# Patient Record
Sex: Female | Born: 1955
Health system: Southern US, Community
[De-identification: ages and names within clinical notes are randomized; demographics above are authoritative.]

## PROBLEM LIST (undated history)

## (undated) DIAGNOSIS — C50919 Malignant neoplasm of unspecified site of unspecified female breast: Secondary | ICD-10-CM

## (undated) DIAGNOSIS — I509 Heart failure, unspecified: Secondary | ICD-10-CM

## (undated) DIAGNOSIS — T827XXA Infection and inflammatory reaction due to other cardiac and vascular devices, implants and grafts, initial encounter: Secondary | ICD-10-CM

## (undated) DIAGNOSIS — Z95810 Presence of automatic (implantable) cardiac defibrillator: Secondary | ICD-10-CM

## (undated) HISTORY — PX: CHOLECYSTECTOMY: SHX55

## (undated) HISTORY — DX: Heart failure, unspecified: I50.9

## (undated) HISTORY — PX: OTHER SURGICAL HISTORY: SHX169

## (undated) HISTORY — DX: Malignant neoplasm of unspecified site of unspecified female breast: C50.919

## (undated) HISTORY — DX: Infection and inflammatory reaction due to other cardiac and vascular devices, implants and grafts, initial encounter: T82.7XXA

## (undated) HISTORY — DX: Presence of automatic (implantable) cardiac defibrillator: Z95.810

---

## 2014-09-14 NOTE — Progress Notes (Signed)
Patient ID: Claudia [name], female   DOB: 1956-03-13, 58 y.o.   MRN: [id number]    58 yo referred by Dr Iona Beard for afib and CHF Moving from [place].  .  She appears to have severe non ischemic DCM.  She had breast cancer in 2008 and appears to have developed herceptin/doxy DCM within 6 months of Rx.  Last year had sepsis and AICD got infected Had to be explanted and she wore life vest. New defib place in [place] 8/15 l but generator placed on left side not infraclavicular.  She had her meds optimized in [place] at CHF clinic.  There was a question at one point of hospice vs transplant but she has done well with meds.  She describes a hospitalization this year with lasix drip where she lost 60 lbs.  No chest pain  Also has likely had what was a subxiphoid window for pericardial effusion complicating lead extraction  Compliant with meds Thinks her new device is [employer]  No PND orthopnea No edema    Also has history of LUE and RLE DVT with PAF on chronic anticoagulation  Reviewed records from [facility] 08/23/14  Hct 37.9 PLT 293  ROS: Denies fever, malais, weight loss, blurry vision, decreased visual acuity, cough, sputum, SOB, hemoptysis, pleuritic pain, palpitaitons, heartburn, abdominal pain, melena, lower extremity edema, claudication, or rash.  All other systems reviewed and negative   General: Affect appropriate Healthy:  appears stated age 83: normal Neck supple with no adenopathy JVP normal no bruits no thyromegaly Lungs clear with no wheezing and good diaphragmatic motion Heart:  S1/S2 MR  murmur,rub, gallop or click PMI normal S/P left mastectomy  Abdomen: benighn, BS positve, no tenderness, no AAA no bruit.  No HSM or HJR Distal pulses intact with no bruits No edema Neuro non-focal Skin warm and dry No muscular weakness AICD on left side near ribs Scars under right clavicle from porta cath removal, Under right clavicle from AICD removal And subxiphoid  from window  Medications Current Outpatient Prescriptions  Medication Sig Dispense Refill  . carvedilol (COREG) 12.5 MG tablet Take 12.5 mg by mouth 2 (two) times daily with a meal. Take an extra 3.125 mg tablet with each dose for a total of 18.75 mg daily.    . digoxin (LANOXIN) 0.125 MG tablet Take by mouth daily.    Marland Kitchen eplerenone (INSPRA) 25 MG tablet Take 25 mg by mouth daily.    . furosemide (LASIX) 40 MG tablet Take 40 mg by mouth daily. 1 tablet in the am and 1/2 tablet in the pm    . hydrALAZINE (APRESOLINE) 25 MG tablet Take 25 mg by mouth 3 (three) times daily. Take 1 and 1/2 tablet daily.    . Isosorbide Mononitrate (IMDUR PO) Take 20 mg by mouth daily.    Marland Kitchen lisinopril (PRINIVIL,ZESTRIL) 10 MG tablet Take 10 mg by mouth daily.    . rivaroxaban (XARELTO) 20 MG TABS tablet Take 20 mg by mouth daily with supper.     No current facility-administered medications for this visit.    Allergies Review of patient's allergies indicates no known allergies.  Family History: Family History  Problem Relation Age of Onset  . Diabetes Mother   . Cancer Father     Social History: History   Social History  . Marital Status: Divorced    Spouse Name: N/A    Number of Children: N/A  . Years of Education: N/A   Occupational History  .  Not on file.   Social History Main Topics  . Smoking status: Former Research scientist (life sciences)  . Smokeless tobacco: Not on file  . Alcohol Use: No  . Drug Use: Not on file  . Sexual Activity: Not on file   Other Topics Concern  . Not on file   Social History Narrative  . No narrative on file    Past Surgical History  Procedure Laterality Date  . Masectomy    . Cholecystectomy    . Fiber tumor    . Device removal      defib    Past Medical History  Diagnosis Date  . Breast cancer   . CHF (congestive heart failure)     Electrocardiogram:  SR rate 67  Diffuse T wave inversions limb lead reversal poor R wave progression   Assessment and Plan

## 2014-09-15 ENCOUNTER — Ambulatory Visit (INDEPENDENT_AMBULATORY_CARE_PROVIDER_SITE_OTHER): Payer: Commercial Managed Care - HMO | Admitting: *Deleted

## 2014-09-15 ENCOUNTER — Telehealth: Payer: Self-pay | Admitting: Cardiovascular Disease

## 2014-09-15 ENCOUNTER — Ambulatory Visit (INDEPENDENT_AMBULATORY_CARE_PROVIDER_SITE_OTHER): Payer: Commercial Managed Care - HMO | Admitting: Cardiovascular Disease

## 2014-09-15 ENCOUNTER — Telehealth: Payer: Self-pay | Admitting: *Deleted

## 2014-09-15 ENCOUNTER — Encounter: Payer: Self-pay | Admitting: Cardiovascular Disease

## 2014-09-15 VITALS — BP 122/60 | HR 67 | Ht 66.0 in | Wt 164.8 lb

## 2014-09-15 DIAGNOSIS — Z95810 Presence of automatic (implantable) cardiac defibrillator: Secondary | ICD-10-CM

## 2014-09-15 DIAGNOSIS — I480 Paroxysmal atrial fibrillation: Secondary | ICD-10-CM

## 2014-09-15 DIAGNOSIS — I255 Ischemic cardiomyopathy: Secondary | ICD-10-CM

## 2014-09-15 DIAGNOSIS — I5022 Chronic systolic (congestive) heart failure: Secondary | ICD-10-CM | POA: Insufficient documentation

## 2014-09-15 HISTORY — DX: Presence of automatic (implantable) cardiac defibrillator: Z95.810

## 2014-09-15 LAB — MDC_IDC_ENUM_SESS_TYPE_INCLINIC
Implantable Pulse Generator Model: 1010
Implantable Pulse Generator Serial Number: 19497

## 2014-09-15 NOTE — Telephone Encounter (Signed)
records requested when appointment made, will be calling asap this am to request a second time.Marland Kitchen

## 2014-09-15 NOTE — Progress Notes (Signed)
S-ICD check in clinic. Battery status OK. Normal device function. No episodes recorded since last check. Pt scheduled for new pt consult with SK 10-07-14 @ 1530. Pt moved from [place], [place].

## 2014-09-15 NOTE — Assessment & Plan Note (Signed)
Euvolemic  F/U echo to assess EF, PA pressure and diastolic parameters Refer to CHF clinic

## 2014-09-15 NOTE — Patient Instructions (Addendum)
Your physician recommends that you schedule a follow-up appointment in: [facility]   EP  AS  WELL   Your physician recommends that you continue on your current medications as directed. Please refer to the Current Medication list given to you today. Your physician has requested that you have an echocardiogram. Echocardiography is a painless test that uses sound waves to create images of your heart. It provides your doctor with information about the size and shape of your heart and how well your heart's chambers and valves are working. This procedure takes approximately one hour. There are no restrictions for this procedure.

## 2014-09-15 NOTE — Assessment & Plan Note (Signed)
Interrogated with device rep [employer]  Therapy ON Shock Zone 220 ERI 100% Impedence within normal limits Will refer to EP for chronic f/u  No device Rx's delivered since implant.  Not clear to me if there will be any special issues Regarding the can placement over left lateral ribs instead of usual infraclavicular sight

## 2014-09-15 NOTE — Telephone Encounter (Signed)
ROI faxed to [facility] records received back gave to Chart Prep Team 12.16.15/Km

## 2014-09-15 NOTE — Assessment & Plan Note (Signed)
Maint NSR On xarelto  CBC ok will have to check BMET  No bleeding issues

## 2014-09-28 ENCOUNTER — Telehealth: Payer: Self-pay | Admitting: Cardiovascular Disease

## 2014-09-28 NOTE — Telephone Encounter (Signed)
CD of records received via mail gave to chart prep team/km

## 2014-09-29 ENCOUNTER — Encounter: Payer: Self-pay | Admitting: Internal Medicine

## 2014-10-06 DIAGNOSIS — Z9012 Acquired absence of left breast and nipple: Secondary | ICD-10-CM | POA: Diagnosis not present

## 2014-10-07 ENCOUNTER — Ambulatory Visit (HOSPITAL_COMMUNITY): Payer: Commercial Managed Care - HMO | Attending: Cardiology | Admitting: Radiology

## 2014-10-07 ENCOUNTER — Ambulatory Visit (INDEPENDENT_AMBULATORY_CARE_PROVIDER_SITE_OTHER): Payer: Commercial Managed Care - HMO | Admitting: Internal Medicine

## 2014-10-07 ENCOUNTER — Encounter: Payer: Self-pay | Admitting: Internal Medicine

## 2014-10-07 VITALS — BP 112/72 | HR 61 | Ht 66.0 in | Wt 168.6 lb

## 2014-10-07 DIAGNOSIS — I081 Rheumatic disorders of both mitral and tricuspid valves: Secondary | ICD-10-CM | POA: Diagnosis not present

## 2014-10-07 DIAGNOSIS — Z4502 Encounter for adjustment and management of automatic implantable cardiac defibrillator: Secondary | ICD-10-CM

## 2014-10-07 DIAGNOSIS — I5022 Chronic systolic (congestive) heart failure: Secondary | ICD-10-CM

## 2014-10-07 DIAGNOSIS — I429 Cardiomyopathy, unspecified: Secondary | ICD-10-CM

## 2014-10-07 DIAGNOSIS — I255 Ischemic cardiomyopathy: Secondary | ICD-10-CM

## 2014-10-07 DIAGNOSIS — I428 Other cardiomyopathies: Secondary | ICD-10-CM

## 2014-10-07 DIAGNOSIS — T827XXD Infection and inflammatory reaction due to other cardiac and vascular devices, implants and grafts, subsequent encounter: Secondary | ICD-10-CM

## 2014-10-07 DIAGNOSIS — T827XXA Infection and inflammatory reaction due to other cardiac and vascular devices, implants and grafts, initial encounter: Secondary | ICD-10-CM

## 2014-10-07 DIAGNOSIS — I480 Paroxysmal atrial fibrillation: Secondary | ICD-10-CM | POA: Diagnosis not present

## 2014-10-07 HISTORY — DX: Infection and inflammatory reaction due to other cardiac and vascular devices, implants and grafts, initial encounter: T82.7XXA

## 2014-10-07 LAB — MDC_IDC_ENUM_SESS_TYPE_INCLINIC
Implantable Pulse Generator Model: 1010
Implantable Pulse Generator Serial Number: 19497
MDC IDC SET ZONE DETECTION INTERVAL: 272.73 ms

## 2014-10-07 NOTE — Patient Instructions (Addendum)
Your physician recommends that you continue on your current medications as directed. Please refer to the Current Medication list given to you today.  Your physician recommends that you schedule a follow-up appointment in: 3 months with device clinic.  Your physician wants you to follow-up in: 12 months Dr. Gari Crown will receive a reminder letter in the mail two months in advance. If you don't receive a letter, please call our office to schedule the follow-up appointment.

## 2014-10-07 NOTE — Progress Notes (Signed)
Echocardiogram performed.  

## 2014-10-07 NOTE — Progress Notes (Signed)
ELECTROPHYSIOLOGY CONSULT NOTE  Patient ID: Claudia [name], MRN: [id number], DOB/AGE: 60-Mar-1957 60 y.o. Admit date: (Not on file) Date of Consult: 10/07/2014  Primary Physician: Maggie Font, MD Primary Cardiologist:  *  Chief Complaint:     HPI Claudia [name] is a 59 y.o. female  Status post S ICD implantation.   She developed breast cancer in 2008 and underwent chemotherapy and in the wake of this, she developed congestive heart failure. She received an ICD implantation. It became infected in the spring of 2015 with staph  and she underwent extraction that was complicated.  Described pericardiocentesis but I don't have the specifics. She was ultimately transferred to [facility] for consideration of heart transplantation. However, when she arrived she was noted me volume overloaded and undermedicated. The records from [facility] are not available; however, she comes in now on terrific medications. She is euvolemic. She is able to climb 3 flights of stairs. She has undergone S ICD implantation following prolonged use of a LifeVest.  She had DVTs involving her arm and her leg while at the hospital and [facility]. She's been anticoagulated.   Past Medical History  Diagnosis Date  . Breast cancer   . CHF (congestive heart failure)   . AICD (automatic cardioverter/defibrillator) present 09/15/2014  . Implantable cardioverter-defibrillator infection  s/p extraction 10/07/2014      Surgical History:  Past Surgical History  Procedure Laterality Date  . Masectomy    . Cholecystectomy    . Fiber tumor    . Device removal      defib     Home Meds: Prior to Admission medications   Medication Sig Start Date End Date Taking? Authorizing Provider  carvedilol (COREG) 12.5 MG tablet Take 12.5 mg by mouth 2 (two) times daily with a meal. Take an extra 3.125 mg tablet with each dose for a total of 18.75 mg daily.   Yes Historical Provider, MD  digoxin (LANOXIN) 0.125 MG tablet Take 0.125 mg by  mouth daily.    Yes Historical Provider, MD  eplerenone (INSPRA) 25 MG tablet Take 25 mg by mouth daily.   Yes Historical Provider, MD  furosemide (LASIX) 40 MG tablet Take 40 mg by mouth daily. 1 tablet in the am by mouth  and 1/2 tablet in the pm by mouth   Yes Historical Provider, MD  hydrALAZINE (APRESOLINE) 25 MG tablet Take 25 mg by mouth 3 (three) times daily. Take 1 and 1/2 tablet daily by mouth   Yes Historical Provider, MD  Isosorbide Mononitrate (IMDUR PO) Take 20 mg by mouth daily.   Yes Historical Provider, MD  lisinopril (PRINIVIL,ZESTRIL) 10 MG tablet Take 10 mg by mouth daily.   Yes Historical Provider, MD  rivaroxaban (XARELTO) 20 MG TABS tablet Take 20 mg by mouth daily with supper.   Yes Historical Provider, MD       Allergies: No Known Allergies  History   Social History  . Marital Status: Divorced    Spouse Name: N/A    Number of Children: N/A  . Years of Education: N/A   Occupational History  . Not on file.   Social History Main Topics  . Smoking status: Former Research scientist (life sciences)  . Smokeless tobacco: Not on file  . Alcohol Use: No  . Drug Use: Not on file  . Sexual Activity: Not on file   Other Topics Concern  . Not on file   Social History Narrative     Family History  Problem Relation Age of Onset  .  Diabetes Mother   . Cancer Father      ROS:  Please see the history of present illness.     All other systems reviewed and negative.    Physical Exam: Blood pressure 112/72, pulse 61, height 5\' 6"  (1.676 m), weight 168 lb 9.6 oz (76.476 kg). General: Well developed, well nourished female in no acute distress. Head: Normocephalic, atraumatic, sclera non-icteric, no xanthomas, nares are without discharge. EENT: normal Lymph Nodes:  none Back: without scoliosis/kyphosis, no CVA tendersness Neck: Negative for carotid bruits. JVD not elevated. Lungs: Clear bilaterally to auscultation without wheezes, rales, or rhonchi. Breathing is unlabored. Heart: RRR with  S1 S2. No murmur , rubs, or gallops appreciated. Abdomen: Soft, non-tender, non-distended with normoactive bowel sounds. No hepatomegaly. No rebound/guarding. No obvious abdominal masses. Msk:  Strength and tone appear normal for age. Extremities: No clubbing or cyanosis. No  edema.  Distal pedal pulses are 2+ and equal bilaterally. Skin: Warm and Dry Neuro: Alert and oriented X 3. CN III-XII intact Grossly normal sensory and motor function . Psych:  Responds to questions appropriately with a normal affect.      Labs:  EKG: Sinus rhythm at 61 Intervals 21/09/39 Axis left -56 Nonspecific ST-T changes    Assessment and Plan:   Nonischemic cardiomyopathy secondary to chemotherapy  Implantable defibrillator-subcutaneous  History of transvenous device complicated by staph aureus infection with extraction  Congestive heart failure-chronic-systolic-class 1-2  DVT on Rivaroxaban  Her device function is normal. She is on guideline directed medical therapy. She is euvolemic.  Virl Axe

## 2014-10-11 ENCOUNTER — Ambulatory Visit (HOSPITAL_COMMUNITY)
Admission: RE | Admit: 2014-10-11 | Discharge: 2014-10-11 | Disposition: A | Discharge status: Home / Self Care | Payer: Commercial Managed Care - HMO | Source: Ambulatory Visit | Attending: Internal Medicine | Admitting: Internal Medicine

## 2014-10-11 ENCOUNTER — Telehealth: Payer: Self-pay | Admitting: Physician Assistant

## 2014-10-11 ENCOUNTER — Encounter (HOSPITAL_COMMUNITY): Payer: Self-pay

## 2014-10-11 VITALS — BP 102/60 | HR 73 | Wt 166.0 lb

## 2014-10-11 DIAGNOSIS — I480 Paroxysmal atrial fibrillation: Secondary | ICD-10-CM | POA: Insufficient documentation

## 2014-10-11 DIAGNOSIS — Z853 Personal history of malignant neoplasm of breast: Secondary | ICD-10-CM | POA: Diagnosis not present

## 2014-10-11 DIAGNOSIS — Z7901 Long term (current) use of anticoagulants: Secondary | ICD-10-CM | POA: Insufficient documentation

## 2014-10-11 DIAGNOSIS — Z86718 Personal history of other venous thrombosis and embolism: Secondary | ICD-10-CM | POA: Diagnosis not present

## 2014-10-11 DIAGNOSIS — I5022 Chronic systolic (congestive) heart failure: Secondary | ICD-10-CM | POA: Insufficient documentation

## 2014-10-11 DIAGNOSIS — E785 Hyperlipidemia, unspecified: Secondary | ICD-10-CM | POA: Insufficient documentation

## 2014-10-11 LAB — BASIC METABOLIC PANEL
Anion gap: 12 (ref 5–15)
BUN: 27 mg/dL — AB (ref 6–23)
CALCIUM: 9.3 mg/dL (ref 8.4–10.5)
CO2: 20 mmol/L (ref 19–32)
CREATININE: 1.61 mg/dL — AB (ref 0.50–1.10)
Chloride: 102 mEq/L (ref 96–112)
GFR calc Af Amer: 39 mL/min — ABNORMAL LOW (ref >90)
GFR calc non Af Amer: 34 mL/min — ABNORMAL LOW (ref >90)
Glucose, Bld: 89 mg/dL (ref 70–99)
POTASSIUM: 4.5 mmol/L (ref 3.5–5.1)
Sodium: 134 mmol/L — ABNORMAL LOW (ref 135–145)

## 2014-10-11 MED ORDER — CARVEDILOL 12.5 MG PO TABS: 12.5000 mg | ORAL_TABLET | Freq: Two times a day (BID) | ORAL | Status: DC

## 2014-10-11 MED ORDER — CARVEDILOL 3.125 MG PO TABS: 3.1250 mg | ORAL_TABLET | Freq: Two times a day (BID) | ORAL | Status: DC

## 2014-10-11 NOTE — Patient Instructions (Signed)
STOP Digoxin  Labs today  Your physician recommends that you schedule a follow-up appointment in: 3 months   Do the following things EVERYDAY: 1) Weigh yourself in the morning before breakfast. Write it down and keep it in a log. 2) Take your medicines as prescribed 3) Eat low salt foods-Limit salt (sodium) to 2000 mg per day.  4) Stay as active as you can everyday 5) Limit all fluids for the day to less than 2 liters 6)

## 2014-10-11 NOTE — Addendum Note (Signed)
Encounter addended by: Kerry Dory, CMA on: 10/11/2014 12:21 PM<BR>     Documentation filed: Medications, Dx Association, Patient Instructions Section, Orders

## 2014-10-11 NOTE — Addendum Note (Signed)
Encounter addended by: Renee Pain, RN on: 10/11/2014  3:03 PM<BR>     Documentation filed: Dx Association, Orders

## 2014-10-11 NOTE — Telephone Encounter (Signed)
Patient called to have Coreg filled at [facility] since mail order will take awhile and she is running out.   [name] PAC

## 2014-10-11 NOTE — Progress Notes (Addendum)
Patient ID: Claudia [name], female   DOB: 08-23-1956, 59 y.o.   MRN: [id number]            ADVANCED HEART FAILURE CLINIC NOTE  Patient ID: Claudia [name], MRN: [id number], DOB/AGE: 05-12-1956 59 y.o. Admit date: 10/11/2014 Date of Consult: 10/11/2014  Chief Complaint:    HPI  Ms. Brienza is a 59 y/o woman with h/o breast cancer in 3716 and systolic HF due to chemotoxicity. She also h/o PAF and previous DVT for which she is on chronic anticoagulation. She moved from [place] in 07/2014 and saw Dr. Johnsie Cancel. She is now referred to the HF Clinic to establish care.   She developed breast cancer in 2008 and underwent chemotherapy and in the wake of this, she developed congestive heart failure with EF 20%. Cardiac cath with normal coronaries. She received an ICD It became infected in the spring of 2015 with staph  and she underwent extraction. She has undergone S ICD implantation following prolonged use of a LifeVest. She was ultimately transferred to [place] in [place] in 01/2014 for consideration of heart transplantation. They diuresed her over 60 pounds and she improved markedly.   She had echo here 10/07/14 and EF 40-45% with normal RV.   She feels great. Exercising every day. Walks a lot. Denies DOE, edema, PND or orthopnea. Weighs every day. Weight stable at 160-161. Takes extra lasix as needed on rare occasions.   Labs:  08/24/14:  Potassium 4.2 Creatinine 1.07 hgb 13.1   Past Medical History  Diagnosis Date  . Breast cancer   . CHF (congestive heart failure)   . AICD (automatic cardioverter/defibrillator) present 09/15/2014  . Implantable cardioverter-defibrillator infection  s/p extraction 10/07/2014      Surgical History:  Past Surgical History  Procedure Laterality Date  . Masectomy    . Cholecystectomy    . Fiber tumor    . Device removal      defib     Home Meds: Prior to Admission medications   Medication Sig Start Date End Date Taking? Authorizing Provider  acetaminophen (TYLENOL) 325 MG  tablet Take 650 mg by mouth every 6 (six) hours as needed.   Yes Historical Provider, MD  carvedilol (COREG) 12.5 MG tablet Take 12.5 mg by mouth 2 (two) times daily with a meal. Take an extra 3.125 mg tablet with each dose for a total of 15.625 mg twice daily.   Yes Historical Provider, MD  carvedilol (COREG) 3.125 MG tablet Take 3.125 mg by mouth 2 (two) times daily with a meal. Take with 12.5 mg tablet for a total of 15.625 mg twice daily   Yes Historical Provider, MD  eplerenone (INSPRA) 25 MG tablet Take 25 mg by mouth daily.   Yes Historical Provider, MD  furosemide (LASIX) 40 MG tablet Take 40 mg by mouth daily. 1 tablet in the am by mouth  and 1/2 tablet in the pm by mouth   Yes Historical Provider, MD  hydrALAZINE (APRESOLINE) 25 MG tablet Take 37.5 mg by mouth 3 (three) times daily. Take 1 and 1/2 tablet daily by mouth   Yes Historical Provider, MD  Isosorbide Mononitrate (IMDUR PO) Take 20 mg by mouth daily.   Yes Historical Provider, MD  lisinopril (PRINIVIL,ZESTRIL) 10 MG tablet Take 10 mg by mouth daily.   Yes Historical Provider, MD  rivaroxaban (XARELTO) 20 MG TABS tablet Take 20 mg by mouth daily with supper.   Yes Historical Provider, MD  rosuvastatin (CRESTOR) 5 MG tablet Take 5 mg  by mouth daily.   Yes Historical Provider, MD      Allergies: No Known Allergies  History   Social History  . Marital Status: Divorced    Spouse Name: N/A    Number of Children: N/A  . Years of Education: N/A   Occupational History  . Not on file.   Social History Main Topics  . Smoking status: Former Research scientist (life sciences)  . Smokeless tobacco: Not on file  . Alcohol Use: No  . Drug Use: Not on file  . Sexual Activity: Not on file   Other Topics Concern  . Not on file   Social History Narrative     Family History  Problem Relation Age of Onset  . Diabetes Mother   . Cancer Father      ROS:  Please see the history of present illness.     All other systems reviewed and negative.     Physical Exam: Blood pressure 102/60, pulse 73, weight 166 lb (75.297 kg), SpO2 98 %. General: Well developed, well nourished female in no acute distress. Head: Normocephalic, atraumatic, sclera non-icteric, no xanthomas, nares are without discharge. EENT: normal Lymph Nodes:  none Back: without scoliosis/kyphosis, no CVA tendersness Neck: Negative for carotid bruits. JVD not elevated. Lungs: Clear bilaterally to auscultation without wheezes, rales, or rhonchi. Breathing is unlabored. Heart: RRR with S1 S2. No murmur , rubs, or gallops appreciated. Abdomen: Soft, non-tender, non-distended with normoactive bowel sounds. No hepatomegaly. No rebound/guarding. No obvious abdominal masses. Msk:  Strength and tone appear normal for age. Extremities: No clubbing or cyanosis. No  edema.  Distal pedal pulses are 2+ and equal bilaterally. Skin: Warm and Dry Neuro: Alert and oriented X 3. CN III-XII intact Grossly normal sensory and motor function . Psych:  Responds to questions appropriately with a normal affect.       Assessment and Plan:   1. Chronic systolic HF  - due to chemotoxicity from breast CA. EF previously 20-25%. Now 40-45% (ECHO 10/2014). Cath normal in [place].  - NYHA I . Volume status looks good - On good meds. BP too low to titrate. Can stop digoxin. 2. Breast CA - s/p treatment 2008 3. Paroxysmal AF - now in NSR. On Xarelto 4. H/o DVT 5. Hyperlipidemia - on crestor. Followed by Dr. Criss Rosales  Check labs today. RTC in 3 months.    Glori Bickers MD

## 2014-10-11 NOTE — Addendum Note (Signed)
Encounter addended by: Jolaine Artist, MD on: 10/11/2014 12:22 PM<BR>     Documentation filed: Notes Section

## 2014-10-12 ENCOUNTER — Other Ambulatory Visit (HOSPITAL_COMMUNITY): Payer: Self-pay | Admitting: Cardiology

## 2014-10-12 ENCOUNTER — Encounter: Payer: Self-pay | Admitting: Internal Medicine

## 2014-10-12 ENCOUNTER — Telehealth (HOSPITAL_COMMUNITY): Payer: Self-pay | Admitting: Vascular Surgery

## 2014-10-12 DIAGNOSIS — I5022 Chronic systolic (congestive) heart failure: Secondary | ICD-10-CM

## 2014-10-12 MED ORDER — CARVEDILOL 12.5 MG PO TABS: 12.5000 mg | ORAL_TABLET | Freq: Two times a day (BID) | ORAL | Status: DC

## 2014-10-12 NOTE — Telephone Encounter (Signed)
Pt need refill Carvedilol she states she picked up the 3.125 but she needs the 12.5.Marland Kitchen Please advise

## 2014-10-12 NOTE — Telephone Encounter (Signed)
As requested rx refill sent into pharmacy for 12.5 mg

## 2014-10-14 ENCOUNTER — Other Ambulatory Visit (HOSPITAL_COMMUNITY): Payer: Self-pay

## 2014-10-14 ENCOUNTER — Telehealth (HOSPITAL_COMMUNITY): Payer: Self-pay | Admitting: Vascular Surgery

## 2014-10-14 DIAGNOSIS — I5022 Chronic systolic (congestive) heart failure: Secondary | ICD-10-CM

## 2014-10-14 MED ORDER — CARVEDILOL 12.5 MG PO TABS: 12.5000 mg | ORAL_TABLET | Freq: Two times a day (BID) | ORAL | Status: DC

## 2014-10-14 NOTE — Telephone Encounter (Signed)
Refill sent.

## 2014-10-14 NOTE — Telephone Encounter (Signed)
Pot need 12.5 mg Carvedilol.. Please advise

## 2014-10-21 ENCOUNTER — Encounter: Payer: Self-pay | Admitting: Internal Medicine

## 2014-10-26 ENCOUNTER — Other Ambulatory Visit: Payer: Self-pay

## 2014-10-26 DIAGNOSIS — I5022 Chronic systolic (congestive) heart failure: Secondary | ICD-10-CM

## 2014-10-26 MED ORDER — CARVEDILOL 3.125 MG PO TABS: 18.0000 mg | ORAL_TABLET | Freq: Two times a day (BID) | ORAL | Status: DC

## 2014-10-26 MED ORDER — CARVEDILOL 12.5 MG PO TABS: 12.5000 mg | ORAL_TABLET | Freq: Two times a day (BID) | ORAL | Status: DC

## 2014-10-26 MED ORDER — LISINOPRIL 10 MG PO TABS: 10.0000 mg | ORAL_TABLET | Freq: Every day | ORAL | Status: DC

## 2014-11-02 ENCOUNTER — Other Ambulatory Visit (HOSPITAL_COMMUNITY): Payer: Self-pay | Admitting: *Deleted

## 2014-11-02 MED ORDER — CARVEDILOL 3.125 MG PO TABS: 3.1250 mg | ORAL_TABLET | Freq: Two times a day (BID) | ORAL | Status: DC

## 2014-11-17 DIAGNOSIS — I509 Heart failure, unspecified: Secondary | ICD-10-CM | POA: Diagnosis not present

## 2014-11-17 DIAGNOSIS — I429 Cardiomyopathy, unspecified: Secondary | ICD-10-CM | POA: Diagnosis not present

## 2014-11-24 DIAGNOSIS — R928 Other abnormal and inconclusive findings on diagnostic imaging of breast: Secondary | ICD-10-CM | POA: Diagnosis not present

## 2014-11-24 DIAGNOSIS — Z853 Personal history of malignant neoplasm of breast: Secondary | ICD-10-CM | POA: Diagnosis not present

## 2014-11-24 DIAGNOSIS — N63 Unspecified lump in breast: Secondary | ICD-10-CM | POA: Diagnosis not present

## 2015-01-12 ENCOUNTER — Ambulatory Visit (INDEPENDENT_AMBULATORY_CARE_PROVIDER_SITE_OTHER): Payer: Commercial Managed Care - HMO | Admitting: *Deleted

## 2015-01-12 DIAGNOSIS — I429 Cardiomyopathy, unspecified: Secondary | ICD-10-CM

## 2015-01-12 DIAGNOSIS — I428 Other cardiomyopathies: Secondary | ICD-10-CM

## 2015-01-12 DIAGNOSIS — I5022 Chronic systolic (congestive) heart failure: Secondary | ICD-10-CM

## 2015-01-17 LAB — MDC_IDC_ENUM_SESS_TYPE_INCLINIC
Battery Remaining Percentage: 96 %
MDC IDC PG SERIAL: 19497
Zone Setting Detection Interval: 272.73 ms

## 2015-01-17 NOTE — Progress Notes (Signed)
SICD check in clinic. Normal device function, normal sensing. 96% remaining battery life. No treated or untreated ventricular episodes. Electrode impedance status is "OK". No programming changes. Patient will follow up with the [facility] in 3 months and with SK in 10-2015.

## 2015-01-25 ENCOUNTER — Telehealth (HOSPITAL_COMMUNITY): Payer: Self-pay | Admitting: *Deleted

## 2015-01-25 NOTE — Telephone Encounter (Signed)
Pt requested appt for 3 month follow up.  I asked her was it ok to see the Nurse Practitioner she said ok.  Kamilah scheduled pt for 01/31/15 at 2pm with Amy Clegg.

## 2015-01-31 ENCOUNTER — Encounter (HOSPITAL_COMMUNITY): Payer: Self-pay

## 2015-01-31 ENCOUNTER — Ambulatory Visit (HOSPITAL_COMMUNITY)
Admission: RE | Admit: 2015-01-31 | Discharge: 2015-01-31 | Disposition: A | Discharge status: Home / Self Care | Payer: Commercial Managed Care - HMO | Source: Ambulatory Visit | Attending: Internal Medicine | Admitting: Internal Medicine

## 2015-01-31 ENCOUNTER — Encounter: Payer: Self-pay | Admitting: Internal Medicine

## 2015-01-31 VITALS — BP 114/66 | HR 83 | Wt 170.2 lb

## 2015-01-31 DIAGNOSIS — Z95810 Presence of automatic (implantable) cardiac defibrillator: Secondary | ICD-10-CM | POA: Diagnosis not present

## 2015-01-31 DIAGNOSIS — Z87891 Personal history of nicotine dependence: Secondary | ICD-10-CM | POA: Insufficient documentation

## 2015-01-31 DIAGNOSIS — I5022 Chronic systolic (congestive) heart failure: Secondary | ICD-10-CM | POA: Diagnosis not present

## 2015-01-31 DIAGNOSIS — I480 Paroxysmal atrial fibrillation: Secondary | ICD-10-CM | POA: Insufficient documentation

## 2015-01-31 DIAGNOSIS — E785 Hyperlipidemia, unspecified: Secondary | ICD-10-CM | POA: Diagnosis not present

## 2015-01-31 DIAGNOSIS — Z79899 Other long term (current) drug therapy: Secondary | ICD-10-CM | POA: Insufficient documentation

## 2015-01-31 DIAGNOSIS — C50919 Malignant neoplasm of unspecified site of unspecified female breast: Secondary | ICD-10-CM | POA: Diagnosis not present

## 2015-01-31 DIAGNOSIS — Z7901 Long term (current) use of anticoagulants: Secondary | ICD-10-CM | POA: Diagnosis not present

## 2015-01-31 DIAGNOSIS — I429 Cardiomyopathy, unspecified: Secondary | ICD-10-CM

## 2015-01-31 DIAGNOSIS — Z86718 Personal history of other venous thrombosis and embolism: Secondary | ICD-10-CM | POA: Insufficient documentation

## 2015-01-31 DIAGNOSIS — I428 Other cardiomyopathies: Secondary | ICD-10-CM

## 2015-01-31 LAB — BASIC METABOLIC PANEL
ANION GAP: 11 (ref 5–15)
BUN: 23 mg/dL — ABNORMAL HIGH (ref 6–20)
CHLORIDE: 100 mmol/L — AB (ref 101–111)
CO2: 24 mmol/L (ref 22–32)
Calcium: 10 mg/dL (ref 8.9–10.3)
Creatinine, Ser: 1.28 mg/dL — ABNORMAL HIGH (ref 0.44–1.00)
GFR calc non Af Amer: 45 mL/min — ABNORMAL LOW (ref >60)
GFR, EST AFRICAN AMERICAN: 52 mL/min — AB (ref >60)
Glucose, Bld: 105 mg/dL — ABNORMAL HIGH (ref 70–99)
POTASSIUM: 4 mmol/L (ref 3.5–5.1)
Sodium: 135 mmol/L (ref 135–145)

## 2015-01-31 NOTE — Progress Notes (Signed)
Primary Cardiologist: Dr Haroldine Laws  HPI: Claudia [name] is a 59 y/o woman with h/o breast cancer in 6301 and systolic HF due to chemotoxicity. She also h/o PAF and previous DVT for which she is on chronic anticoagulation. She moved from [place] in 07/2014 and saw Dr. Johnsie Cancel.   She developed breast cancer in 2008 and underwent chemotherapy and in the wake of this, she developed congestive heart failure with EF 20%. Cardiac cath with normal coronaries. She received an ICD It became infected in the spring of 2015 with staph and she underwent extraction. She has undergone S ICD implantation following prolonged use of a LifeVest. She was ultimately transferred to [place] in [place] in 01/2014 for consideration of heart transplantation. They diuresed her over 60 pounds and she improved markedly.   She returns for HF follow up. Last visit digoxin was stopped. Overall  Doing great. Denies SOB/PND/Orthopnea. Attends the senior center for daily exercise. Weight at home 165 pounds. Takes extra 20 mg of lasix about once a week. Taking all medication .   ECHO 10/07/14 and EF 40-45% with normal RV.   Labs: 08/24/14: Potassium 4.2 Creatinine 1.07 hgb 13.1  ROS: All systems negative except as listed in HPI, PMH and Problem List.  SH:  History   Social History  . Marital Status: Divorced    Spouse Name: N/A  . Number of Children: N/A  . Years of Education: N/A   Occupational History  . Not on file.   Social History Main Topics  . Smoking status: Former Research scientist (life sciences)  . Smokeless tobacco: Not on file  . Alcohol Use: No  . Drug Use: Not on file  . Sexual Activity: Not on file   Other Topics Concern  . Not on file   Social History Narrative    FH:  Family History  Problem Relation Age of Onset  . Diabetes Mother   . Cancer Father     Past Medical History  Diagnosis Date  . Breast cancer   . CHF (congestive heart failure)   . AICD (automatic cardioverter/defibrillator) present 09/15/2014  . Implantable  cardioverter-defibrillator infection  s/p extraction 10/07/2014    Current Outpatient Prescriptions  Medication Sig Dispense Refill  . acetaminophen (TYLENOL) 325 MG tablet Take 650 mg by mouth every 6 (six) hours as needed.    . carvedilol (COREG) 12.5 MG tablet Take 1 tablet (12.5 mg total) by mouth 2 (two) times daily with a meal. Take extra 3.125mg  tab with each dose to total 15.625mg  twice daily. 180 tablet 3  . carvedilol (COREG) 3.125 MG tablet Take 1 tablet (3.125 mg total) by mouth 2 (two) times daily with a meal. Take with 12.5 mg tablet for a total of 15.625 mg twice daily 180 tablet 3  . eplerenone (INSPRA) 25 MG tablet Take 25 mg by mouth daily.    . furosemide (LASIX) 40 MG tablet Take 40 mg by mouth daily. 1 tablet in the am by mouth  and 1/2 tablet in the pm by mouth    . hydrALAZINE (APRESOLINE) 25 MG tablet Take 37.5 mg by mouth 3 (three) times daily. Take 1 and 1/2 tablet daily by mouth    . Isosorbide Mononitrate (IMDUR PO) Take 20 mg by mouth daily.    Marland Kitchen lisinopril (PRINIVIL,ZESTRIL) 10 MG tablet Take 1 tablet (10 mg total) by mouth daily. 90 tablet 3  . rivaroxaban (XARELTO) 20 MG TABS tablet Take 20 mg by mouth daily with supper.    . rosuvastatin (CRESTOR)  5 MG tablet Take 5 mg by mouth daily.     No current facility-administered medications for this encounter.    Filed Vitals:   01/31/15 1337  BP: 114/66  Pulse: 83  Weight: 170 lb 4 oz (77.225 kg)  SpO2: 99%    PHYSICAL EXAM:  General:  Well appearing. No resp difficulty. Ambulated in the clinic without difficulty.  HEENT: normal Neck: supple. JVP flat. Carotids 2+ bilaterally; no bruits. No lymphadenopathy or thryomegaly appreciated. Cor: PMI normal. Regular rate & rhythm. No rubs, gallops or murmurs. Lungs: clear Abdomen: soft, nontender, nondistended. No hepatosplenomegaly. No bruits or masses. Good bowel sounds. Extremities: no cyanosis, clubbing, rash, edema Neuro: alert & orientedx3, cranial nerves  grossly intact. Moves all 4 extremities w/o difficulty. Affect pleasant.     ASSESSMENT & PLAN:   1. Chronic systolic HF - due to chemotoxicity from breast CA. EF in January 2016 40-45% . Cath normal in [place]. - NYHA I . Volume status stable. Continue lasix 40 mg in and and 20 mg in pm - Continue current dose of carvedilol, inspra, hydralazine, and imdur.  2. Breast CA - s/p treatment 2008 3. Paroxysmal AF - now in NSR. On Xarelto 4. H/o DVT- on xarelto . No bleeding problems.  5. Hyperlipidemia - on crestor. Followed by Dr. Criss Rosales  Follow up in 4 months   CLEGG,AMY NP-C  2:12 PM

## 2015-01-31 NOTE — Patient Instructions (Signed)
   Follow up in 4 months  Do the following things EVERYDAY: 1) Weigh yourself in the morning before breakfast. Write it down and keep it in a log. 2) Take your medicines as prescribed 3) Eat low salt foods-Limit salt (sodium) to 2000 mg per day.  4) Stay as active as you can everyday 5) Limit all fluids for the day to less than 2 liters 

## 2015-02-08 ENCOUNTER — Other Ambulatory Visit (HOSPITAL_COMMUNITY): Payer: Self-pay | Admitting: *Deleted

## 2015-02-08 MED ORDER — FUROSEMIDE 40 MG PO TABS: 40.0000 mg | ORAL_TABLET | Freq: Every day | ORAL | Status: DC

## 2015-02-08 MED ORDER — RIVAROXABAN 20 MG PO TABS
20.0000 mg | ORAL_TABLET | Freq: Every day | ORAL | Status: DC
Start: 2015-02-08 — End: 2015-11-22

## 2015-02-09 ENCOUNTER — Other Ambulatory Visit (HOSPITAL_COMMUNITY): Payer: Self-pay | Admitting: *Deleted

## 2015-02-09 MED ORDER — FUROSEMIDE 40 MG PO TABS: ORAL_TABLET | ORAL | Status: DC

## 2015-03-08 DIAGNOSIS — I509 Heart failure, unspecified: Secondary | ICD-10-CM | POA: Diagnosis not present

## 2015-03-08 DIAGNOSIS — I429 Cardiomyopathy, unspecified: Secondary | ICD-10-CM | POA: Diagnosis not present

## 2015-04-13 ENCOUNTER — Ambulatory Visit (INDEPENDENT_AMBULATORY_CARE_PROVIDER_SITE_OTHER): Payer: Commercial Managed Care - HMO | Admitting: *Deleted

## 2015-04-13 DIAGNOSIS — I429 Cardiomyopathy, unspecified: Secondary | ICD-10-CM

## 2015-04-13 DIAGNOSIS — I5022 Chronic systolic (congestive) heart failure: Secondary | ICD-10-CM | POA: Diagnosis not present

## 2015-04-13 DIAGNOSIS — I428 Other cardiomyopathies: Secondary | ICD-10-CM

## 2015-04-13 LAB — CUP PACEART INCLINIC DEVICE CHECK
Date Time Interrogation Session: 20160713142018
Pulse Gen Model: 1010
Pulse Gen Serial Number: 19497
Zone Setting Detection Interval: 272.73 ms

## 2015-04-13 NOTE — Progress Notes (Signed)
Subcutaneous ICD check in clinic. No episodes treated or untreated. Electrode impedance status okay. No programming changes. Remaining longevity to ERI 91%. ROV with device clinic 07/13/15 at 2pm.

## 2015-05-09 ENCOUNTER — Encounter: Payer: Self-pay | Admitting: Internal Medicine

## 2015-06-08 DIAGNOSIS — C50912 Malignant neoplasm of unspecified site of left female breast: Secondary | ICD-10-CM | POA: Diagnosis not present

## 2015-06-08 DIAGNOSIS — I10 Essential (primary) hypertension: Secondary | ICD-10-CM | POA: Diagnosis not present

## 2015-06-08 DIAGNOSIS — I5042 Chronic combined systolic (congestive) and diastolic (congestive) heart failure: Secondary | ICD-10-CM | POA: Diagnosis not present

## 2015-07-15 ENCOUNTER — Ambulatory Visit (INDEPENDENT_AMBULATORY_CARE_PROVIDER_SITE_OTHER): Payer: Commercial Managed Care - HMO | Admitting: *Deleted

## 2015-07-15 DIAGNOSIS — I429 Cardiomyopathy, unspecified: Secondary | ICD-10-CM

## 2015-07-15 DIAGNOSIS — I428 Other cardiomyopathies: Secondary | ICD-10-CM

## 2015-07-15 LAB — CUP PACEART INCLINIC DEVICE CHECK
Battery Remaining Percentage: 86 %
Implantable Lead Location: 753862
Implantable Lead Model: 3010
MDC IDC LEAD IMPLANT DT: 20150819
MDC IDC PG SERIAL: 19497
MDC IDC SESS DTM: 20161014143414
MDC IDC SET ZONE DETECTION INTERVAL: 272.73 ms
MDC IDC SET ZONE VENDOR TYPE: 771138
Pulse Gen Model: 1010
Zone Setting Vendor Type Category: 771137
Zone Setting Vendor Type Category: 771139

## 2015-07-15 NOTE — Progress Notes (Signed)
Subcutaneous ICD check in clinic. No episodes treated or untreated. Electrode impedance status okay. No programming changes. Remaining longevity to ERI 86%. ROV with SK on 10/17/15 @ 1500.

## 2015-07-29 ENCOUNTER — Telehealth (HOSPITAL_COMMUNITY): Payer: Self-pay | Admitting: Vascular Surgery

## 2015-07-29 NOTE — Telephone Encounter (Signed)
Left pt message to give me a call back about appt 08/02/15

## 2015-08-01 DIAGNOSIS — C50912 Malignant neoplasm of unspecified site of left female breast: Secondary | ICD-10-CM | POA: Diagnosis not present

## 2015-08-02 ENCOUNTER — Encounter (HOSPITAL_COMMUNITY): Payer: Self-pay | Admitting: Internal Medicine

## 2015-08-02 ENCOUNTER — Ambulatory Visit (HOSPITAL_COMMUNITY)
Admission: RE | Admit: 2015-08-02 | Discharge: 2015-08-02 | Disposition: A | Discharge status: Home / Self Care | Payer: Commercial Managed Care - HMO | Source: Ambulatory Visit | Attending: Internal Medicine | Admitting: Internal Medicine

## 2015-08-02 VITALS — BP 116/68 | HR 69 | Ht 66.0 in | Wt 183.0 lb

## 2015-08-02 DIAGNOSIS — Z86718 Personal history of other venous thrombosis and embolism: Secondary | ICD-10-CM | POA: Diagnosis not present

## 2015-08-02 DIAGNOSIS — I480 Paroxysmal atrial fibrillation: Secondary | ICD-10-CM | POA: Diagnosis not present

## 2015-08-02 DIAGNOSIS — Z853 Personal history of malignant neoplasm of breast: Secondary | ICD-10-CM

## 2015-08-02 DIAGNOSIS — C50919 Malignant neoplasm of unspecified site of unspecified female breast: Secondary | ICD-10-CM | POA: Insufficient documentation

## 2015-08-02 DIAGNOSIS — Z833 Family history of diabetes mellitus: Secondary | ICD-10-CM | POA: Diagnosis not present

## 2015-08-02 DIAGNOSIS — Z79899 Other long term (current) drug therapy: Secondary | ICD-10-CM | POA: Diagnosis not present

## 2015-08-02 DIAGNOSIS — Z7902 Long term (current) use of antithrombotics/antiplatelets: Secondary | ICD-10-CM | POA: Insufficient documentation

## 2015-08-02 DIAGNOSIS — Z87891 Personal history of nicotine dependence: Secondary | ICD-10-CM | POA: Insufficient documentation

## 2015-08-02 DIAGNOSIS — E785 Hyperlipidemia, unspecified: Secondary | ICD-10-CM | POA: Diagnosis not present

## 2015-08-02 DIAGNOSIS — I5022 Chronic systolic (congestive) heart failure: Secondary | ICD-10-CM

## 2015-08-02 LAB — BASIC METABOLIC PANEL
ANION GAP: 8 (ref 5–15)
BUN: 19 mg/dL (ref 6–20)
CALCIUM: 10 mg/dL (ref 8.9–10.3)
CO2: 28 mmol/L (ref 22–32)
CREATININE: 1.31 mg/dL — AB (ref 0.44–1.00)
Chloride: 104 mmol/L (ref 101–111)
GFR calc Af Amer: 51 mL/min — ABNORMAL LOW (ref >60)
GFR calc non Af Amer: 44 mL/min — ABNORMAL LOW (ref >60)
GLUCOSE: 99 mg/dL (ref 65–99)
Potassium: 4 mmol/L (ref 3.5–5.1)
Sodium: 140 mmol/L (ref 135–145)

## 2015-08-02 MED ORDER — FUROSEMIDE 40 MG PO TABS: ORAL_TABLET | ORAL | Status: DC

## 2015-08-02 NOTE — Progress Notes (Signed)
Patient ID: Claudia [name], female   DOB: Jan 10, 1956, 59 y.o.   MRN: [id number]  Primary Cardiologist: Dr Haroldine Laws PCP: Dr Criss Rosales   HPI: Ms. Sandell is a 59 y/o woman with h/o breast cancer in 2440 and systolic HF due to chemotoxicity. She also h/o PAF and previous DVT for which she is on chronic anticoagulation. She moved from [place] in 07/2014 and saw Dr. Johnsie Cancel.   She developed breast cancer in 2008 and underwent chemotherapy and in the wake of this, she developed congestive heart failure with EF 20%. Cardiac cath with normal coronaries. She received an ICD It became infected in the spring of 2015 with staph and she underwent extraction. She has undergone S ICD implantation following prolonged use of a LifeVest. She was ultimately transferred to [facility] in [place] in 01/2014 for consideration of heart transplantation. They diuresed her over 60 pounds and she improved markedly.   She returns for HF follow up. Overall doing great.   Denies SOB/PND/Orthopnea. Exercising 5 days a week. Weight at home 177-181 pounds. Says she is [facility] the senior center.  Follows low salt diet and limits fluid to < 2 liters per day. Taking all medication. Wants referral to oncology.   ECHO 10/07/14 and EF 40-45% with normal RV.   Labs: 08/24/14: Potassium 4.2 Creatinine 1.07 hgb 13.1 01/31/2015: K 4.0 Creatinine 1.28   ROS: All systems negative except as listed in HPI, PMH and Problem List.  SH:  Social History   Social History  . Marital Status: Divorced    Spouse Name: N/A  . Number of Children: N/A  . Years of Education: N/A   Occupational History  . Not on file.   Social History Main Topics  . Smoking status: Former Research scientist (life sciences)  . Smokeless tobacco: Not on file  . Alcohol Use: No  . Drug Use: Not on file  . Sexual Activity: Not on file   Other Topics Concern  . Not on file   Social History Narrative    FH:  Family History  Problem Relation Age of Onset  . Diabetes Mother   . Cancer  Father     Past Medical History  Diagnosis Date  . Breast cancer   . CHF (congestive heart failure)   . AICD (automatic cardioverter/defibrillator) present 09/15/2014  . Implantable cardioverter-defibrillator infection  s/p extraction 10/07/2014    Current Outpatient Prescriptions  Medication Sig Dispense Refill  . acetaminophen (TYLENOL) 325 MG tablet Take 650 mg by mouth every 6 (six) hours as needed.    . carvedilol (COREG) 12.5 MG tablet Take 1 tablet (12.5 mg total) by mouth 2 (two) times daily with a meal. Take extra 3.125mg  tab with each dose to total 15.625mg  twice daily. 180 tablet 3  . carvedilol (COREG) 3.125 MG tablet Take 1 tablet (3.125 mg total) by mouth 2 (two) times daily with a meal. Take with 12.5 mg tablet for a total of 15.625 mg twice daily 180 tablet 3  . eplerenone (INSPRA) 25 MG tablet Take 25 mg by mouth daily.    . furosemide (LASIX) 40 MG tablet 40 mg (1 tablet) in the am by mouth  and 20 mg (1/2 tablet) in the pm by mouth 90 tablet 3  . hydrALAZINE (APRESOLINE) 25 MG tablet Take 37.5 mg by mouth 3 (three) times daily. Take 1 and 1/2 tablet daily by mouth    . Isosorbide Mononitrate (IMDUR PO) Take 20 mg by mouth daily.    Marland Kitchen lisinopril (  PRINIVIL,ZESTRIL) 10 MG tablet Take 1 tablet (10 mg total) by mouth daily. 90 tablet 3  . rivaroxaban (XARELTO) 20 MG TABS tablet Take 1 tablet (20 mg total) by mouth daily with supper. 90 tablet 3  . rosuvastatin (CRESTOR) 5 MG tablet Take 5 mg by mouth daily.     No current facility-administered medications for this encounter.    Filed Vitals:   08/02/15 1353  BP: 116/68  Pulse: 69  Height: 5\' 6"  (1.676 m)  Weight: 183 lb (83.008 kg)  SpO2: 98%    PHYSICAL EXAM:  General:  Well appearing. No resp difficulty. Ambulated in the clinic without difficulty.  HEENT: normal Neck: supple. JVP flat. Carotids 2+ bilaterally; no bruits. No lymphadenopathy or thryomegaly appreciated. Cor: PMI normal. Regular rate & rhythm. No  rubs, gallops or murmurs. Lungs: clear Abdomen: soft, nontender, nondistended. No hepatosplenomegaly. No bruits or masses. Good bowel sounds. Extremities: no cyanosis, clubbing, rash, edema Neuro: alert & orientedx3, cranial nerves grossly intact. Moves all 4 extremities w/o difficulty. Affect pleasant.     ASSESSMENT & PLAN:   1. Chronic systolic HF - due to chemotoxicity from breast CA. EF in January 2016 40-45% . Cath normal in [place]. - NYHA I . Doing great.   Volume status stable. Continue lasix 40 mg in and and 20 mg in pm - Continue current dose of carvedilol, inspra, hydralazine, and imdur.  Check BMET today  2. Breast CA - s/p treatment 2008. Has not seen an oncologist since she moved from [place] in 2015. Wants referral to oncologist. Will discuss with Dr Haroldine Laws  3. Paroxysmal AF - Regular rhythm.  On Xarelto. No Bleeding problem.  4. H/o DVT- on xarelto . No bleeding problems.  5. Hyperlipidemia - on crestor. Followed by Dr. Criss Rosales  Follow up in 6 with an ECHO.   Anapaola Kinsel NP-C  1:53 PM

## 2015-08-02 NOTE — Patient Instructions (Signed)
Labs today will call if abnormal  Follow up in 6 months with Bensimhon and an echo

## 2015-08-08 ENCOUNTER — Other Ambulatory Visit (HOSPITAL_COMMUNITY): Payer: Self-pay | Admitting: Internal Medicine

## 2015-09-01 ENCOUNTER — Telehealth: Payer: Self-pay | Admitting: Hematology and Oncology

## 2015-09-01 NOTE — Telephone Encounter (Signed)
New breast appt-s/w patient and gave np appt for 12/08 @ 3:45 w/Dr. Lindi Adie.  Referring Dr. Kathrynn Humble

## 2015-09-05 ENCOUNTER — Encounter: Payer: Self-pay | Admitting: Internal Medicine

## 2015-09-08 ENCOUNTER — Encounter: Payer: Self-pay | Admitting: Hematology and Oncology

## 2015-09-08 ENCOUNTER — Ambulatory Visit (HOSPITAL_BASED_OUTPATIENT_CLINIC_OR_DEPARTMENT_OTHER): Payer: Commercial Managed Care - HMO | Admitting: Hematology and Oncology

## 2015-09-08 ENCOUNTER — Telehealth: Payer: Self-pay | Admitting: Hematology and Oncology

## 2015-09-08 DIAGNOSIS — I429 Cardiomyopathy, unspecified: Secondary | ICD-10-CM | POA: Diagnosis not present

## 2015-09-08 DIAGNOSIS — Z853 Personal history of malignant neoplasm of breast: Secondary | ICD-10-CM

## 2015-09-08 DIAGNOSIS — I428 Other cardiomyopathies: Secondary | ICD-10-CM

## 2015-09-08 DIAGNOSIS — C50412 Malignant neoplasm of upper-outer quadrant of left female breast: Secondary | ICD-10-CM

## 2015-09-08 NOTE — Addendum Note (Signed)
Addended by: Prentiss Bells on: 09/08/2015 09:03 PM   Modules accepted: Medications

## 2015-09-08 NOTE — Assessment & Plan Note (Signed)
left breast invasive ductal carcinoma with DCIS intermediate to high-grade diagnosed February 2008, ER 0%, PR 0%, HER-2 positive ratio 5.3, Ki-67 54%, inflammatory breast cancer with Peau de Orange, stage IIIB clinical stage.  Status post neoadjuvant chemotherapy with AC 4 followed by mastectomy 02/25/2007, T1b N1 (stage IB pathologic stage); followed by Taxol-Herceptin4 (stopped early due to cardiomyopathy); adjuvant radiation completed 08/08/2007  Breast Cancer Surveillance: 1. Breast exam 09/08/2015 : Normal 2. Mammogram 09/07/2015 No abnormalities right breast.  Survivorship: Discussed the importance of physical exercise in decreasing the likelihood of breast cancer recurrence. Recommended 30 mins daily 6 days a week of either brisk walking or cycling or swimming. Encouraged patient to eat more fruits and vegetables and decrease red meat.   Return to clinic once a year for follow-up

## 2015-09-08 NOTE — Progress Notes (Signed)
[facility] NOTE  Patient Care Team: Iona Beard, MD as PCP - General (Family Medicine) Nicholas Lose, MD as Consulting Physician (Hematology and Oncology) Jolaine Artist, MD as Consulting Physician (Cardiology)  CHIEF COMPLAINTS/PURPOSE OF CONSULTATION:  To establish oncology care  HISTORY OF PRESENTING ILLNESS:  Claudia [name] 59 y.o. female is here because of recent diagnosis of left breast cancer. She was diagnosed with left breast cancer and [facility] when she was living with her son who is a Clinical biochemist at the [employer]. She underwent neoadjuvant chemotherapy for estrogen-negative HER-2 positive stage IIIB inflammatory breast cancer. After that she underwent mastectomy followed by Taxol Herceptin. Herceptin and Taxol were discontinued early because of cardiomyopathy. She subsequently underwent adjuvant radiation therapy. Over the years she had multiple issues with a heart. At one point they were considering heart transplantation [facility]. She has moved to [facility] about a year ago. She is under the care of Dr. Ronna Polio with cardiology regarding her chronic heart failure. She is apparently doing extremely well without any signs or symptoms of heart failure. She has an AICD implanted. She also underwent a pericardiocentesis in the past. She reports no major problems or concerns with the breast.  I reviewed her records extensively and collaborated the history with the patient.  SUMMARY OF ONCOLOGIC HISTORY:   Breast cancer of upper-outer quadrant of left female breast ([place])   11/08/2006 Initial Diagnosis left breast invasive ductal carcinoma with DCIS intermediate to high-grade, ER 0%, PR 0%, HER-2 positive ratio 5.3, Ki-67 54%, inflammatory breast cancer with Peau de Orange, stage IIIB   12/03/2006 - 02/05/2007 Neo-Adjuvant Chemotherapy neoadjuvant chemotherapy with AC X 4 (the entire treatment was given in [facility] by Dr. Jama Flavors   02/25/2007 Surgery left mastectomy: IDC with DCIS 1 cm, lymphovascular invasion present, T1b N1 mic stage IB, 3/8 lymph nodes microscopic tumor less than 0.2 mm   04/02/2007 - 06/04/2007 Chemotherapy Taxol 4 with Herceptin stopped early because of decreased ejection fraction to 15% in September 2008   06/24/2007 - 08/08/2007 Radiation Therapy adjuvant radiation therapy treating supraclavicular and axillary areas and chest wall    Procedure chronic heart failure under the care of Dr. Haroldine Laws, AICD ( at one point patient was evaluated for heart transplant in [facility])    MEDICAL HISTORY:  Past Medical History  Diagnosis Date  . Breast cancer ([place])   . CHF (congestive heart failure) ([place])   . AICD (automatic cardioverter/defibrillator) present 09/15/2014  . Implantable cardioverter-defibrillator infection  s/p extraction 10/07/2014    SURGICAL HISTORY: Past Surgical History  Procedure Laterality Date  . Masectomy    . Cholecystectomy    . Fiber tumor    . Device removal      defib    SOCIAL HISTORY: Social History   Social History  . Marital Status: Divorced    Spouse Name: N/A  . Number of Children: N/A  . Years of Education: N/A   Occupational History  . Not on file.   Social History Main Topics  . Smoking status: Former Research scientist (life sciences)  . Smokeless tobacco: Not on file  . Alcohol Use: No  . Drug Use: Not on file  . Sexual Activity: Not on file   Other Topics Concern  . Not on file   Social History Narrative    FAMILY HISTORY: Family History  Problem Relation Age of Onset  . Diabetes Mother   . Cancer Father  ALLERGIES:  has No Known Allergies.  MEDICATIONS:  Current Outpatient Prescriptions  Medication Sig Dispense Refill  . acetaminophen (TYLENOL) 325 MG tablet Take 650 mg by mouth every 6 (six) hours as needed.    . carvedilol (COREG) 12.5 MG tablet Take 1 tablet (12.5 mg total) by mouth 2 (two) times daily with a meal. Take extra 3.191m tab with each dose to  total 15.6258mtwice daily. 180 tablet 3  . carvedilol (COREG) 3.125 MG tablet Take 1 tablet (3.125 mg total) by mouth 2 (two) times daily with a meal. Take with 12.5 mg tablet for a total of 15.625 mg twice daily 180 tablet 3  . eplerenone (INSPRA) 25 MG tablet Take 25 mg by mouth daily.    . furosemide (LASIX) 40 MG tablet TAKE 1 TABLET IN THE MORNING AND TAKE 1/2 TABLET   IN THE EVENING 135 tablet 3  . hydrALAZINE (APRESOLINE) 25 MG tablet Take 37.5 mg by mouth 3 (three) times daily. Take 1 and 1/2 tablet daily by mouth    . Isosorbide Mononitrate (IMDUR PO) Take 20 mg by mouth daily.    . Marland Kitchenisinopril (PRINIVIL,ZESTRIL) 10 MG tablet Take 1 tablet (10 mg total) by mouth daily. 90 tablet 3  . rivaroxaban (XARELTO) 20 MG TABS tablet Take 1 tablet (20 mg total) by mouth daily with supper. 90 tablet 3  . rosuvastatin (CRESTOR) 5 MG tablet Take 5 mg by mouth daily.     No current facility-administered medications for this visit.    REVIEW OF SYSTEMS:   Constitutional: Denies fevers, chills or abnormal night sweats Eyes: Denies blurriness of vision, double vision or watery eyes Ears, nose, mouth, throat, and face: Denies mucositis or sore throat Respiratory: Denies cough, dyspnea or wheezes Cardiovascular: Denies palpitation, chest discomfort or lower extremity swelling Gastrointestinal:  Denies nausea, heartburn or change in bowel habits Skin: Denies abnormal skin rashes Lymphatics: Denies new lymphadenopathy or easy bruising Neurological:Denies numbness, tingling or new weaknesses Behavioral/Psych: Mood is stable, no new changes  Breast:  Denies any palpable lumps or discharge All other systems were reviewed with the patient and are negative.  PHYSICAL EXAMINATION: ECOG PERFORMANCE STATUS: 1 - Symptomatic but completely ambulatory  There were no vitals filed for this visit. There were no vitals filed for this visit.  GENERAL:alert, no distress and comfortable SKIN: skin color, texture,  turgor are normal, no rashes or significant lesions EYES: normal, conjunctiva are pink and non-injected, sclera clear OROPHARYNX:no exudate, no erythema and lips, buccal mucosa, and tongue normal  NECK: supple, thyroid normal size, non-tender, without nodularity LYMPH:  no palpable lymphadenopathy in the cervical, axillary or inguinal LUNGS: clear to auscultation and percussion with normal breathing effort HEART: regular rate & rhythm and no murmurs and no lower extremity edema ABDOMEN:abdomen soft, non-tender and normal bowel sounds Musculoskeletal:no cyanosis of digits and no clubbing  PSYCH: alert & oriented x 3 with fluent speech NEURO: no focal motor/sensory deficits BREAST: No palpable nodules in right breast. No palpable axillary or supraclavicular lymphadenopathy (exam performed in the presence of a chaperone)   LABORATORY DATA:  I have reviewed the data as listed No results found for: WBC, HGB, HCT, MCV, PLT Lab Results  Component Value Date   NA 140 08/02/2015   K 4.0 08/02/2015   CL 104 08/02/2015   CO2 28 08/02/2015   ASSESSMENT AND PLAN:  [place] of [place]outer [place] with DCIS intermediate to high-grade diagnosed  February 2008, ER 0%, PR 0%, HER-2 positive ratio 5.3, Ki-67 54%, inflammatory breast cancer with Peau de Orange, stage IIIB clinical stage.  Status post neoadjuvant chemotherapy with AC 4 followed by mastectomy 02/25/2007, T1b N1 (stage IB pathologic stage); followed by Taxol-Herceptin4 (stopped early due to cardiomyopathy); adjuvant radiation completed 08/08/2007  Breast Cancer Surveillance: 1. Breast exam 09/08/2015 : Normal 2. Mammogram 09/07/2015 No abnormalities right breast.  Survivorship: Discussed the importance of physical exercise in decreasing the likelihood of breast cancer recurrence. Recommended 30 mins daily 6 days a week of either brisk walking or cycling or swimming.  Encouraged patient to eat more fruits and vegetables and decrease red meat.   Return to clinic once a year for follow-up    Nonischemic cardiomyopathy ([place]) Under the care of cardiology. Her ejection fraction has improved from 15% to 40% according to the patient. She is not symptomatic anymore. She does not have any peripheral edema. She watches her diet very carefully and exercises regularly.  All questions were answered. The patient knows to call the clinic with any problems, questions or concerns.    Rulon Eisenmenger, MD 4:26 PM

## 2015-09-08 NOTE — Assessment & Plan Note (Signed)
Under the care of cardiology. Her ejection fraction has improved from 15% to 40% according to the patient. She is not symptomatic anymore. She does not have any peripheral edema. She watches her diet very carefully and exercises regularly.

## 2015-09-08 NOTE — Telephone Encounter (Signed)
Appointments made and avs printed for patient °

## 2015-09-13 ENCOUNTER — Encounter: Payer: Self-pay | Admitting: Hematology and Oncology

## 2015-09-13 NOTE — Addendum Note (Signed)
Addended by: Prentiss Bells on: 09/13/2015 12:38 PM   Modules accepted: Medications

## 2015-09-22 ENCOUNTER — Other Ambulatory Visit (HOSPITAL_COMMUNITY): Payer: Self-pay | Admitting: Internal Medicine

## 2015-09-22 DIAGNOSIS — I482 Chronic atrial fibrillation: Secondary | ICD-10-CM | POA: Diagnosis not present

## 2015-09-22 DIAGNOSIS — I110 Hypertensive heart disease with heart failure: Secondary | ICD-10-CM | POA: Diagnosis not present

## 2015-09-22 DIAGNOSIS — E784 Other hyperlipidemia: Secondary | ICD-10-CM | POA: Diagnosis not present

## 2015-09-22 DIAGNOSIS — I252 Old myocardial infarction: Secondary | ICD-10-CM | POA: Diagnosis not present

## 2015-09-22 DIAGNOSIS — Z0000 Encounter for general adult medical examination without abnormal findings: Secondary | ICD-10-CM | POA: Diagnosis not present

## 2015-09-22 DIAGNOSIS — I509 Heart failure, unspecified: Secondary | ICD-10-CM | POA: Diagnosis not present

## 2015-09-22 DIAGNOSIS — Z6829 Body mass index (BMI) 29.0-29.9, adult: Secondary | ICD-10-CM | POA: Diagnosis not present

## 2015-10-17 ENCOUNTER — Encounter: Payer: Self-pay | Admitting: Internal Medicine

## 2015-10-17 ENCOUNTER — Ambulatory Visit (INDEPENDENT_AMBULATORY_CARE_PROVIDER_SITE_OTHER): Payer: Commercial Managed Care - HMO | Admitting: Internal Medicine

## 2015-10-17 VITALS — BP 126/80 | HR 66 | Ht 66.0 in | Wt 195.0 lb

## 2015-10-17 DIAGNOSIS — I429 Cardiomyopathy, unspecified: Secondary | ICD-10-CM | POA: Diagnosis not present

## 2015-10-17 DIAGNOSIS — I428 Other cardiomyopathies: Secondary | ICD-10-CM

## 2015-10-17 DIAGNOSIS — Z95810 Presence of automatic (implantable) cardiac defibrillator: Secondary | ICD-10-CM

## 2015-10-17 LAB — CUP PACEART INCLINIC DEVICE CHECK
Date Time Interrogation Session: 20170116173846
Implantable Lead Location: 753862
MDC IDC LEAD IMPLANT DT: 20150819
MDC IDC LEAD MODEL: 3010
Pulse Gen Serial Number: 19497

## 2015-10-17 NOTE — Progress Notes (Signed)
Patient Care Team: Iona Beard, MD as PCP - General (Family Medicine) Nicholas Lose, MD as Consulting Physician (Hematology and Oncology) Jolaine Artist, MD as Consulting Physician (Cardiology)   HPI  Claudia [name] is a 60 y.o. female Seen in follow-up for S ICD implanted for primary prevention for nonischemic cardiomyopathy attributed to chemotherapy  ;  Some problems with lightheadedness and presyncope  BP recorded as low as 77.  No interval defibrillator shocks;  Not able to sleep on her side   No edema orthopnea  Records and Results Reviewed office recordds Last creatinine 1.31  Past Medical History  Diagnosis Date  . Breast cancer ([place])   . CHF (congestive heart failure) ([place])   . AICD (automatic cardioverter/defibrillator) present 09/15/2014  . Implantable cardioverter-defibrillator infection  s/p extraction 10/07/2014    Past Surgical History  Procedure Laterality Date  . Masectomy    . Cholecystectomy    . Fiber tumor    . Device removal      defib    Current Outpatient Prescriptions  Medication Sig Dispense Refill  . acetaminophen (TYLENOL) 325 MG tablet Take 650 mg by mouth every 6 (six) hours as needed.    . carvedilol (COREG) 12.5 MG tablet TAKE 1 TABLET TWO TIMES DAILY WITH A MEAL. TAKE EXTRA 3.125MG  TABLET WITH EACH DOSE TO TOTAL 15.625 TWICE DAILY. 180 tablet 3  . carvedilol (COREG) 3.125 MG tablet Take 1 tablet (3.125 mg total) by mouth 2 (two) times daily with a meal. Take with 12.5 mg tablet for a total of 15.625 mg twice daily 180 tablet 3  . eplerenone (INSPRA) 25 MG tablet Take 25 mg by mouth daily.    . fluticasone (FLONASE) 50 MCG/ACT nasal spray Place 1 spray into both nostrils daily.    . furosemide (LASIX) 40 MG tablet TAKE 1 TABLET IN THE MORNING AND TAKE 1/2 TABLET   IN THE EVENING 135 tablet 3  . hydrALAZINE (APRESOLINE) 25 MG tablet Take 37.5 mg by mouth 3 (three) times daily. Take 1 and 1/2 tablet daily by mouth    . Isosorbide  Mononitrate (IMDUR PO) Take 20 mg by mouth daily.    Marland Kitchen lisinopril (PRINIVIL,ZESTRIL) 10 MG tablet Take 1 tablet (10 mg total) by mouth daily. 90 tablet 3  . rivaroxaban (XARELTO) 20 MG TABS tablet Take 1 tablet (20 mg total) by mouth daily with supper. 90 tablet 3   No current facility-administered medications for this visit.    No Known Allergies    Review of Systems negative except from HPI and PMH  Physical Exam BP 126/80 mmHg  Pulse 66  Ht 5\' 6"  (1.676 m)  Wt 195 lb (88.451 kg)  BMI 31.49 kg/m2 Well developed and well nourished in no acute distress HENT normal E scleral and icterus clear Neck Supple JVP flat; carotids brisk and full Clear to ausculation Device pocket well healed; without hematoma or erythema.  There is no tethering   *Regular rate and rhythm, no murmurs gallops or rub Soft with active bowel sounds No clubbing cyanosis  Edema Alert and oriented, grossly normal motor and sensory function Skin Warm and Dry  ECG demonstrates sinus rhythm at 66 Intervals 19/10/40 Axis left at -10 T-wave inversions 2 83F V4-V6  Assessment and  Plan  Nonischemic cardiomyopathy  Congestive heart failure-chronic-systolic  DVT on NOAC  Orthostatic intolerance and presyncope  ICD -SubQ    From an arrhythmia point of view the patient is well. She is  euvolemic. I worry about her low blood pressures especially on NOAC therapy. I've taken the liberty of making 3 changes. We will have her take her lisinopril and her isosorbide at night. I've asked that she decrease her hydralazine from 37 .5  3  times a day in the event that she continues to have dizziness to 25/25/37.5.  She may need further drug modifications. I will be in touch with Dr. Reine Just at the heart failure clinic

## 2015-10-17 NOTE — Patient Instructions (Addendum)
Medication Instructions: 1) take imdur (isosorbide) at night 2) take lisinopril at night  Labwork: - none  Procedures/Testing: - none  Follow-Up: - Your physician recommends that you schedule a follow-up appointment in: 3 months with [facility] physician wants you to follow-up in: 1 year with Dr. Caryl Comes. You will receive a reminder letter in the mail two months in advance. If you don't receive a letter, please call our office to schedule the follow-up appointment.  Any Additional Special Instructions Will Be Listed Below (If Applicable).

## 2015-10-18 ENCOUNTER — Telehealth: Payer: Self-pay | Admitting: Internal Medicine

## 2015-10-18 MED ORDER — ISOSORBIDE MONONITRATE ER 30 MG PO TB24: ORAL_TABLET | ORAL | Status: DC

## 2015-10-18 NOTE — Telephone Encounter (Signed)
New message     Pt c/o medication issue:  1. Name of Medication: isosorbide 2. How are you currently taking this medication (dosage and times per day)? 20mg  3. Are you having a reaction (difficulty breathing--STAT)? no  4. What is your medication issue? Pt needs clarification on the dosage and how many times she is to take her medication

## 2015-10-18 NOTE — Telephone Encounter (Signed)
Reviewed with Dr. Caryl Comes- ok to change to imdur (isosorbide MN) 30 mg one tablet by mouth at bedtime. The patient is aware of this and agreeable. I will send to [facility] per the patient's request.

## 2015-10-18 NOTE — Telephone Encounter (Signed)
I called and spoke with the patient.  We advised her yesterday at her office visit to take her isosorbide MN 20 mg at bedtime. She states today that she is taking isosorbide DN 20 mg TID. I advised her I will review with Dr. Caryl Comes and call her back.

## 2015-11-07 DIAGNOSIS — Z6831 Body mass index (BMI) 31.0-31.9, adult: Secondary | ICD-10-CM | POA: Diagnosis not present

## 2015-11-07 DIAGNOSIS — I5042 Chronic combined systolic (congestive) and diastolic (congestive) heart failure: Secondary | ICD-10-CM | POA: Diagnosis not present

## 2015-11-07 DIAGNOSIS — N183 Chronic kidney disease, stage 3 (moderate): Secondary | ICD-10-CM | POA: Diagnosis not present

## 2015-11-08 ENCOUNTER — Other Ambulatory Visit (HOSPITAL_COMMUNITY): Payer: Self-pay | Admitting: Internal Medicine

## 2015-11-22 ENCOUNTER — Other Ambulatory Visit (HOSPITAL_COMMUNITY): Payer: Self-pay | Admitting: Internal Medicine

## 2015-11-23 ENCOUNTER — Other Ambulatory Visit (HOSPITAL_COMMUNITY): Payer: Self-pay | Admitting: Internal Medicine

## 2015-11-28 DIAGNOSIS — Z853 Personal history of malignant neoplasm of breast: Secondary | ICD-10-CM | POA: Diagnosis not present

## 2015-11-28 DIAGNOSIS — Z1231 Encounter for screening mammogram for malignant neoplasm of breast: Secondary | ICD-10-CM | POA: Diagnosis not present

## 2015-11-29 DIAGNOSIS — M25572 Pain in left ankle and joints of left foot: Secondary | ICD-10-CM | POA: Diagnosis not present

## 2015-12-15 ENCOUNTER — Encounter (HOSPITAL_COMMUNITY): Payer: Self-pay | Admitting: Emergency Medicine

## 2015-12-15 ENCOUNTER — Emergency Department (HOSPITAL_COMMUNITY): Payer: Commercial Managed Care - HMO

## 2015-12-15 ENCOUNTER — Observation Stay (HOSPITAL_COMMUNITY)
Admission: EM | Admit: 2015-12-15 | Discharge: 2015-12-17 | Disposition: A | Discharge status: Home / Self Care | Payer: Commercial Managed Care - HMO | Attending: Internal Medicine | Admitting: Internal Medicine

## 2015-12-15 DIAGNOSIS — Z7901 Long term (current) use of anticoagulants: Secondary | ICD-10-CM | POA: Insufficient documentation

## 2015-12-15 DIAGNOSIS — I472 Ventricular tachycardia, unspecified: Secondary | ICD-10-CM

## 2015-12-15 DIAGNOSIS — Z87891 Personal history of nicotine dependence: Secondary | ICD-10-CM | POA: Insufficient documentation

## 2015-12-15 DIAGNOSIS — I4729 Other ventricular tachycardia: Secondary | ICD-10-CM

## 2015-12-15 DIAGNOSIS — Z4502 Encounter for adjustment and management of automatic implantable cardiac defibrillator: Secondary | ICD-10-CM

## 2015-12-15 DIAGNOSIS — R52 Pain, unspecified: Secondary | ICD-10-CM

## 2015-12-15 DIAGNOSIS — Z95810 Presence of automatic (implantable) cardiac defibrillator: Secondary | ICD-10-CM | POA: Diagnosis not present

## 2015-12-15 DIAGNOSIS — R778 Other specified abnormalities of plasma proteins: Secondary | ICD-10-CM | POA: Diagnosis not present

## 2015-12-15 DIAGNOSIS — R7989 Other specified abnormal findings of blood chemistry: Secondary | ICD-10-CM | POA: Diagnosis not present

## 2015-12-15 DIAGNOSIS — Z853 Personal history of malignant neoplasm of breast: Secondary | ICD-10-CM | POA: Diagnosis not present

## 2015-12-15 DIAGNOSIS — I480 Paroxysmal atrial fibrillation: Secondary | ICD-10-CM | POA: Diagnosis present

## 2015-12-15 DIAGNOSIS — Z79899 Other long term (current) drug therapy: Secondary | ICD-10-CM | POA: Diagnosis not present

## 2015-12-15 DIAGNOSIS — Z9221 Personal history of antineoplastic chemotherapy: Secondary | ICD-10-CM | POA: Diagnosis not present

## 2015-12-15 DIAGNOSIS — Z0389 Encounter for observation for other suspected diseases and conditions ruled out: Secondary | ICD-10-CM

## 2015-12-15 DIAGNOSIS — Z86718 Personal history of other venous thrombosis and embolism: Secondary | ICD-10-CM | POA: Diagnosis not present

## 2015-12-15 DIAGNOSIS — Z9049 Acquired absence of other specified parts of digestive tract: Secondary | ICD-10-CM | POA: Diagnosis not present

## 2015-12-15 DIAGNOSIS — Z9010 Acquired absence of unspecified breast and nipple: Secondary | ICD-10-CM | POA: Diagnosis not present

## 2015-12-15 DIAGNOSIS — I429 Cardiomyopathy, unspecified: Secondary | ICD-10-CM | POA: Insufficient documentation

## 2015-12-15 DIAGNOSIS — I5022 Chronic systolic (congestive) heart failure: Secondary | ICD-10-CM | POA: Diagnosis not present

## 2015-12-15 DIAGNOSIS — I428 Other cardiomyopathies: Secondary | ICD-10-CM

## 2015-12-15 DIAGNOSIS — T82198A Other mechanical complication of other cardiac electronic device, initial encounter: Secondary | ICD-10-CM | POA: Diagnosis not present

## 2015-12-15 DIAGNOSIS — R079 Chest pain, unspecified: Secondary | ICD-10-CM | POA: Diagnosis not present

## 2015-12-15 DIAGNOSIS — M25551 Pain in right hip: Secondary | ICD-10-CM | POA: Diagnosis not present

## 2015-12-15 LAB — CBC WITH DIFFERENTIAL/PLATELET
Basophils Absolute: 0 10*3/uL (ref 0.0–0.1)
Basophils Relative: 0 %
Eosinophils Absolute: 0.3 10*3/uL (ref 0.0–0.7)
Eosinophils Relative: 5 %
HCT: 34.5 % — ABNORMAL LOW (ref 36.0–46.0)
Hemoglobin: 11.1 g/dL — ABNORMAL LOW (ref 12.0–15.0)
Lymphocytes Relative: 30 %
Lymphs Abs: 1.5 10*3/uL (ref 0.7–4.0)
MCH: 26.6 pg (ref 26.0–34.0)
MCHC: 32.2 g/dL (ref 30.0–36.0)
MCV: 82.5 fL (ref 78.0–100.0)
Monocytes Absolute: 0.4 10*3/uL (ref 0.1–1.0)
Monocytes Relative: 7 %
Neutro Abs: 2.9 10*3/uL (ref 1.7–7.7)
Neutrophils Relative %: 58 %
Platelets: 256 10*3/uL (ref 150–400)
RBC: 4.18 MIL/uL (ref 3.87–5.11)
RDW: 13.8 % (ref 11.5–15.5)
WBC: 5 10*3/uL (ref 4.0–10.5)

## 2015-12-15 LAB — BASIC METABOLIC PANEL
Anion gap: 11 (ref 5–15)
BUN: 27 mg/dL — ABNORMAL HIGH (ref 6–20)
CO2: 21 mmol/L — ABNORMAL LOW (ref 22–32)
Calcium: 8.7 mg/dL — ABNORMAL LOW (ref 8.9–10.3)
Chloride: 105 mmol/L (ref 101–111)
Creatinine, Ser: 1.18 mg/dL — ABNORMAL HIGH (ref 0.44–1.00)
GFR calc Af Amer: 57 mL/min — ABNORMAL LOW (ref >60)
GFR calc non Af Amer: 49 mL/min — ABNORMAL LOW (ref >60)
Glucose, Bld: 121 mg/dL — ABNORMAL HIGH (ref 65–99)
Potassium: 3.8 mmol/L (ref 3.5–5.1)
Sodium: 137 mmol/L (ref 135–145)

## 2015-12-15 LAB — MAGNESIUM: Magnesium: 1.6 mg/dL — ABNORMAL LOW (ref 1.7–2.4)

## 2015-12-15 LAB — I-STAT TROPONIN, ED: Troponin i, poc: 0.14 ng/mL (ref 0.00–0.08)

## 2015-12-15 MED ORDER — MAGNESIUM SULFATE IN D5W 10-5 MG/ML-% IV SOLN
1.0000 g | Freq: Once | INTRAVENOUS | Status: AC
  Administered 2015-12-15: 1 g via INTRAVENOUS
  Filled 2015-12-15: qty 100

## 2015-12-15 MED ORDER — ACETAMINOPHEN 325 MG PO TABS
650.0000 mg | ORAL_TABLET | Freq: Once | ORAL | Status: AC
  Administered 2015-12-15: 650 mg via ORAL
  Filled 2015-12-15: qty 2

## 2015-12-15 MED ORDER — ASPIRIN 81 MG PO CHEW
324.0000 mg | CHEWABLE_TABLET | Freq: Once | ORAL | Status: AC
  Administered 2015-12-15: 324 mg via ORAL
  Filled 2015-12-15: qty 4

## 2015-12-15 NOTE — ED Notes (Signed)
Attempted report x1. 

## 2015-12-15 NOTE — ED Notes (Signed)
[employer] ID device will not work Gaffer. Trouble shooting line consulted. Representative confirms it will not work for her device and he will report to ED in 15 minutes

## 2015-12-15 NOTE — ED Notes (Signed)
AICD rep at bedside

## 2015-12-15 NOTE — ED Notes (Signed)
PT assisted onto bedside toilet. PT reports severe pain in right hip. PT reports she can barely bare weight on extremity. PT feels it should be xrayed. Dr. Tomi Bamberger made aware.

## 2015-12-15 NOTE — ED Notes (Signed)
PT was at home watching TV and eating popcorn when her AICD fired. PT then got up and tried to call her daughter, it fired again. PT fell down. PT got back up and it fired a third time. PT reports very little time lapsed between each epidsode. PT saw cardiology in November. PT reports chest soreness. PT denies SOB at this time.

## 2015-12-15 NOTE — ED Notes (Signed)
Cardiology at bedside.

## 2015-12-15 NOTE — ED Provider Notes (Addendum)
Pt presented to the ED after her AICD fired.  She denies any prodromal symptoms.  No chest pain.  No shortness of breath.  Now has some chest soreness.    On exam, Heart RRR.  Lungs CTA   Abd soft non tender.  Pacemaker interrogation shows the device went off for a wide complex rhythm around 150 which is lower than the set rate.  Slight increase in troponin likely related to the ACID firing.   Discussed with cardiology, Dr Philbert Riser.  Will come in and evaluate the patient in the ED.   EKG Interpretation  Date/Time:  Thursday December 15 2015 19:31:41 EDT Ventricular Rate:  92 PR Interval:  199 QRS Duration: 100 QT Interval:  350 QTC Calculation: 433 R Axis:   144 Text Interpretation:  Sinus rhythm Borderline prolonged PR interval Anterolateral infarct, age indeterminate No old tracing to compare Confirmed by Laquanda Bick  MD-J, Deirdra Heumann [phone]) on 12/15/2015 7:39:14 PM      Medical screening examination/treatment/procedure(s) were conducted as a shared visit with non-physician practitioner(s) and myself.  I personally evaluated the patient during the encounter.    Dorie Rank, MD 12/15/15 2144  Pt is being admitted by cardiology.  Now complains of hip pain.  Will add on a right hip xray  Dorie Rank, MD 12/15/15 [phone]

## 2015-12-15 NOTE — H&P (Signed)
HPI:  60 yo woman with h/o NICM presumed likely 2/2 chemotherapy for breast CA, primary prevention AICD, chronic systolic CHF and PE on Xarelto who presents to [facility] ED with complaint of ICD discharge.  She reports she was in her usual state of health when her defibrillator fired while siting on couch watching TV.  She states that had never happened before.  She got up and went to the phone to call her daughter when it fired a second time and a third time shortly thereafter.  She fell and hurt her right leg because of the shocks.  She denies any symptoms such as CP, SOB, palpitations, dizziness/lightheadedness or syncope.  She has not had any new medications nor been sick recently.    Review of Systems:     Cardiac Review of Systems: {Y] = yes [ ]  = no  Chest Pain [    ]  Resting SOB [   ] Exertional SOB  [  ]  Orthopnea [  ]   Pedal Edema [   ]    Palpitations [  ] Syncope  [  ]   Presyncope [   ]  General Review of Systems: [Y] = yes [  ]=no Constitional: recent weight change [  ]; anorexia [  ]; fatigue [  ]; nausea [  ]; night sweats [  ]; fever [  ]; or chills [  ];                                                                      Dental: poor dentition[  ];   Eye : blurred vision [  ]; diplopia [   ]; vision changes [  ];  Amaurosis fugax[  ]; Resp: cough [  ];  wheezing[  ];  hemoptysis[  ]; shortness of breath[  ]; paroxysmal nocturnal dyspnea[  ]; dyspnea on exertion[  ]; or orthopnea[  ];  GI:  gallstones[  ], vomiting[  ];  dysphagia[  ]; melena[  ];  hematochezia [  ]; heartburn[  ];   GU: kidney stones [  ]; hematuria[  ];   dysuria [  ];  nocturia[  ];               Skin: rash [  ], swelling[  ];, hair loss[  ];  peripheral edema[  ];  or itching[  ]; Musculosketetal: myalgias[  ];  joint swelling[  ];  joint erythema[  ];  joint pain[  ];  back pain[  ];  Heme/Lymph: bruising[  ];  bleeding[  ];  anemia[  ];  Neuro: TIA[  ];  headaches[  ];  stroke[  ];  vertigo[  ];   seizures[  ];   paresthesias[  ];  difficulty walking[  ];  Psych:depression[  ]; anxiety[  ];  Endocrine: diabetes[  ];  thyroid dysfunction[  ];  Other:  Past Medical History  Diagnosis Date  . Breast cancer ([place])   . CHF (congestive heart failure) ([place])   . AICD (automatic cardioverter/defibrillator) present 09/15/2014  . 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   Current Outpatient Prescriptions on  File Prior to Encounter  Medication Sig Dispense Refill  . acetaminophen (TYLENOL) 325 MG tablet Take 650 mg by mouth every 6 (six) hours as needed.    . carvedilol (COREG) 12.5 MG tablet TAKE 1 TABLET TWO TIMES DAILY WITH A MEAL. TAKE EXTRA 3.125MG  TABLET WITH EACH DOSE TO TOTAL 15.625 TWICE DAILY. 180 tablet 3  . carvedilol (COREG) 3.125 MG tablet TAKE 1 TABLET TWICE DAILY WITH MEALS ,TAKE WITH 12.5MG  TABLET FOR A TOTAL OF 15.625MG  TWICE DAILY 180 tablet 3  . eplerenone (INSPRA) 25 MG tablet Take 25 mg by mouth daily.    . furosemide (LASIX) 40 MG tablet TAKE 1 TABLET IN THE MORNING AND TAKE 1/2 TABLET   IN THE EVENING 135 tablet 3  . hydrALAZINE (APRESOLINE) 25 MG tablet Take 37.5 mg by mouth 3 (three) times daily. Take 1 and 1/2 tablet daily by mouth    . isosorbide mononitrate (IMDUR) 30 MG 24 hr tablet Take one tablet (30 mg) by mouth at night 90 tablet 3  . lisinopril (PRINIVIL,ZESTRIL) 10 MG tablet TAKE 1 TABLET EVERY DAY 90 tablet 3  . XARELTO 20 MG TABS tablet TAKE 1 TABLET EVERY DAY  WITH  SUPPER 90 tablet 3     No Known Allergies  Social History   Social History  . Marital Status: Divorced    Spouse Name: N/A  . Number of Children: N/A  . Years of Education: N/A   Occupational History  . Not on file.   Social History Main Topics  . Smoking status: Former Research scientist (life sciences)  . Smokeless tobacco: Never Used  . Alcohol Use: No  . Drug Use: No  . Sexual Activity: Not  on file   Other Topics Concern  . Not on file   Social History Narrative    Family History  Problem Relation Age of Onset  . Diabetes Mother   . Cancer Father     PHYSICAL EXAM: Filed Vitals:   12/15/15 2215 12/15/15 2230  BP: 119/82 132/79  Pulse: 76 82  Temp:    Resp: 22 18   General:  Well appearing. No respiratory difficulty HEENT: normal Neck: supple. no JVD. Carotids 2+ bilat; no bruits. No lymphadenopathy or thryomegaly appreciated. Cor: PMI nondisplaced. Regular rate & rhythm. No rubs, gallops or murmurs.  S/p mastectomy Lungs: clear Abdomen: soft, nontender, nondistended. No hepatosplenomegaly. No bruits or masses. Good bowel sounds. Extremities: no cyanosis, clubbing, rash, edema Neuro: alert & oriented x 3, cranial nerves grossly intact. moves all 4 extremities w/o difficulty. Affect pleasant.   ECG:  Possible lead reversal, SR, RAD, septal and lateral Qs, T wave abnormality  Results for orders placed or performed during the hospital encounter of 12/15/15 (from the past 24 hour(s))  Basic metabolic panel     Status: Abnormal   Collection Time: 12/15/15  7:54 PM  Result Value Ref Range   Sodium 137 135 - 145 mmol/L   Potassium 3.8 3.5 - 5.1 mmol/L   Chloride 105 101 - 111 mmol/L   CO2 21 (L) 22 - 32 mmol/L   Glucose, Bld 121 (H) 65 - 99 mg/dL   BUN 27 (H) 6 - 20 mg/dL   Creatinine, Ser 1.18 (H) 0.44 - 1.00 mg/dL   Calcium 8.7 (L) 8.9 - 10.3 mg/dL   GFR calc non Af Amer 49 (L) >60 mL/min   GFR calc Af Amer 57 (L) >60 mL/min   Anion gap 11 5 - 15  CBC with Differential  Status: Abnormal   Collection Time: 12/15/15  7:54 PM  Result Value Ref Range   WBC 5.0 4.0 - 10.5 K/uL   RBC 4.18 3.87 - 5.11 MIL/uL   Hemoglobin 11.1 (L) 12.0 - 15.0 g/dL   HCT 34.5 (L) 36.0 - 46.0 %   MCV 82.5 78.0 - 100.0 fL   MCH 26.6 26.0 - 34.0 pg   MCHC 32.2 30.0 - 36.0 g/dL   RDW 13.8 11.5 - 15.5 %   Platelets 256 150 - 400 K/uL   Neutrophils Relative % 58 %   Neutro  Abs 2.9 1.7 - 7.7 K/uL   Lymphocytes Relative 30 %   Lymphs Abs 1.5 0.7 - 4.0 K/uL   Monocytes Relative 7 %   Monocytes Absolute 0.4 0.1 - 1.0 K/uL   Eosinophils Relative 5 %   Eosinophils Absolute 0.3 0.0 - 0.7 K/uL   Basophils Relative 0 %   Basophils Absolute 0.0 0.0 - 0.1 K/uL  I-Stat Troponin, ED (not at [facility])     Status: Abnormal   Collection Time: 12/15/15  8:29 PM  Result Value Ref Range   Troponin i, poc 0.14 (HH) 0.00 - 0.08 ng/mL   Comment NOTIFIED PHYSICIAN    Comment 3          Magnesium     Status: Abnormal   Collection Time: 12/15/15  8:29 PM  Result Value Ref Range   Magnesium 1.6 (L) 1.7 - 2.4 mg/dL   Dg Chest Portable 1 View  12/15/2015  CLINICAL DATA:  Defibrillator fired 3 times today. Initial encounter. EXAM: PORTABLE CHEST 1 VIEW COMPARISON:  None. FINDINGS: The lungs are well-aerated and clear. There is no evidence of focal opacification, pleural effusion or pneumothorax. The cardiomediastinal silhouette is within normal limits. No acute osseous abnormalities are seen. A metallic device is noted overlying the left lower chest wall, with a single lead ending overlying the mediastinum. IMPRESSION: No acute cardiopulmonary process seen. Electronically Signed   By: Garald Balding M.D.   On: 12/15/2015 22:02     ASSESSMENT: 60 yo woman with h/o NICM presumed likely 2/2 chemotherapy for breast CA, primary prevention AICD, chronic systolic CHF and PE on Xarelto who presents to [facility] ED with complaint of ICD discharge.  Device interrogation reveals 3 shocks, underlying rhythm difficult to accurately assess in setting of a single lead device.  Review of strips a slightly irregular tachycardia with rate ~ 150 bpm. Differential includes atrial tachyarrhythmia leading to inappropriate therapy vs ventricular.  Given asymptomatic prior to discharge and irregularity, favor atrial source but cannot say with certainty.  Suspect troponin is due to underlying cardiomyopathy and ICD  discharges as she has not had any symptoms of ACS.  She also appears euvolemic and well perfused from a CHF standpoint.   PLAN/DISCUSSION: Admit to tele for observation given no history of prior discharges followed by 3 consecutive shocks today Correct electrolytes, recheck in AM Check TSH Continue home medications EP to see in AM for possible change in device settings +/- change in medications (AAD?)

## 2015-12-15 NOTE — ED Provider Notes (Signed)
CSN: [id number]     Arrival date & time 12/15/15  1922 History   First MD Initiated Contact with Patient 12/15/15 1939     Chief Complaint  Patient presents with  . AICD Problem    defib fired x3     (Consider location/radiation/quality/duration/timing/severity/associated sxs/prior Treatment) HPI Comments: Patient is a 60yo female with a PMHx of CHF, ICD in place who presents for firing of her ICD. Patient was sitting watching TV this evening when she felt her ICD fire. Patient tried to get up to call her daughter and it fired again which made her fall. Patient tried to get up again, but it fired again. Patient's ICD fired a total of 3 times.  Patient did not lose consciousness, or hit her head on fall. Patient endorses chest soreness now. Patient denies shortness of breath, palpitations, abdominal pain, N/V, edema, or dysuria. Patient has had no recent illness. Patient last saw her cardiologist for ICD check in November. Patient has ultrasound scheduled in May.  The history is provided by the patient.    Past Medical History  Diagnosis Date  . Breast cancer ([place])   . CHF (congestive heart failure) ([place])   . AICD (automatic cardioverter/defibrillator) present 09/15/2014  . Implantable cardioverter-defibrillator infection  s/p extraction 10/07/2014   Past Surgical History  Procedure Laterality Date  . Masectomy    . Cholecystectomy    . Fiber tumor    . Device removal      defib   Family History  Problem Relation Age of Onset  . Diabetes Mother   . Cancer Father    Social History  Substance Use Topics  . Smoking status: Former Research scientist (life sciences)  . Smokeless tobacco: Never Used  . Alcohol Use: No   OB History    No data available     Review of Systems  Constitutional: Negative for fever and chills.  HENT: Negative for facial swelling.   Respiratory: Negative for cough and shortness of breath.   Cardiovascular: Positive for chest pain (soreness).  Gastrointestinal: Negative for  nausea, vomiting and abdominal pain.  Genitourinary: Negative for dysuria.  Musculoskeletal: Negative for back pain.  Skin: Negative for rash and wound.  Neurological: Negative for headaches.  Psychiatric/Behavioral: The patient is nervous/anxious.       Allergies  Review of patient's allergies indicates no known allergies.  Home Medications   Prior to Admission medications   Medication Sig Start Date End Date Taking? Authorizing Provider  acetaminophen (TYLENOL) 325 MG tablet Take 650 mg by mouth every 6 (six) hours as needed.   Yes Historical Provider, MD  carvedilol (COREG) 12.5 MG tablet TAKE 1 TABLET TWO TIMES DAILY WITH A MEAL. TAKE EXTRA 3.125MG  TABLET WITH EACH DOSE TO TOTAL 15.625 TWICE DAILY. 09/23/15  Yes Shaune Pascal Bensimhon, MD  carvedilol (COREG) 3.125 MG tablet TAKE 1 TABLET TWICE DAILY WITH MEALS ,TAKE WITH 12.5MG  TABLET FOR A TOTAL OF 15.625MG  TWICE DAILY 11/24/15  Yes Jolaine Artist, MD  eplerenone (INSPRA) 25 MG tablet Take 25 mg by mouth daily.   Yes Historical Provider, MD  furosemide (LASIX) 40 MG tablet TAKE 1 TABLET IN THE MORNING AND TAKE 1/2 TABLET   IN THE EVENING 08/08/15  Yes Jolaine Artist, MD  hydrALAZINE (APRESOLINE) 25 MG tablet Take 37.5 mg by mouth 3 (three) times daily. Take 1 and 1/2 tablet daily by mouth   Yes Historical Provider, MD  isosorbide mononitrate (IMDUR) 30 MG 24 hr tablet Take one tablet (30 mg)  by mouth at night 10/18/15  Yes Deboraha Sprang, MD  lisinopril (PRINIVIL,ZESTRIL) 10 MG tablet TAKE 1 TABLET EVERY DAY 11/08/15  Yes Jolaine Artist, MD  XARELTO 20 MG TABS tablet TAKE 1 TABLET EVERY DAY  WITH  SUPPER 11/22/15  Yes Shaune Pascal Bensimhon, MD   BP 132/79 mmHg  Pulse 82  Temp(Src) 99.3 F (37.4 C) (Oral)  Resp 18  Ht 5\' 6"  (1.676 m)  Wt 83.462 kg  BMI 29.71 kg/m2  SpO2 100% Physical Exam  Constitutional: She appears well-developed and well-nourished. No distress.  HENT:  Head: Normocephalic and atraumatic.  Eyes:  Conjunctivae are normal. Pupils are equal, round, and reactive to light. Right eye exhibits no discharge. Left eye exhibits no discharge. No scleral icterus.  Neck: Normal range of motion. Neck supple. No thyromegaly present.  Cardiovascular: Normal rate, normal heart sounds and intact distal pulses.  Exam reveals no gallop and no friction rub.   No murmur heard. Pulmonary/Chest: Effort normal and breath sounds normal. No stridor. No respiratory distress. She has no wheezes. She has no rales.  Abdominal: Soft. Bowel sounds are normal. She exhibits no distension. There is no tenderness. There is no rebound and no guarding.  Musculoskeletal: She exhibits no edema.  Lymphadenopathy:    She has no cervical adenopathy.  Neurological: She is alert. Coordination normal.  Skin: Skin is warm and dry. No rash noted. She is not diaphoretic. No pallor.  Psychiatric: She has a normal mood and affect.  Nursing note and vitals reviewed.   ED Course  Procedures (including critical care time) Labs Review Labs Reviewed  BASIC METABOLIC PANEL - Abnormal; Notable for the following:    CO2 21 (*)    Glucose, Bld 121 (*)    BUN 27 (*)    Creatinine, Ser 1.18 (*)    Calcium 8.7 (*)    GFR calc non Af Amer 49 (*)    GFR calc Af Amer 57 (*)    All other components within normal limits  CBC WITH DIFFERENTIAL/PLATELET - Abnormal; Notable for the following:    Hemoglobin 11.1 (*)    HCT 34.5 (*)    All other components within normal limits  MAGNESIUM - Abnormal; Notable for the following:    Magnesium 1.6 (*)    All other components within normal limits  I-STAT TROPOININ, ED - Abnormal; Notable for the following:    Troponin i, poc 0.14 (*)    All other components within normal limits    Imaging Review Dg Chest Portable 1 View  12/15/2015  CLINICAL DATA:  Defibrillator fired 3 times today. Initial encounter. EXAM: PORTABLE CHEST 1 VIEW COMPARISON:  None. FINDINGS: The lungs are well-aerated and clear.  There is no evidence of focal opacification, pleural effusion or pneumothorax. The cardiomediastinal silhouette is within normal limits. No acute osseous abnormalities are seen. A metallic device is noted overlying the left lower chest wall, with a single lead ending overlying the mediastinum. IMPRESSION: No acute cardiopulmonary process seen. Electronically Signed   By: Garald Balding M.D.   On: 12/15/2015 22:02   I have personally reviewed and evaluated these images and lab results as part of my medical decision-making.   EKG Interpretation   Date/Time:  Thursday December 15 2015 19:31:41 EDT Ventricular Rate:  92 PR Interval:  199 QRS Duration: 100 QT Interval:  350 QTC Calculation: 433 R Axis:   144 Text Interpretation:  Sinus rhythm Borderline prolonged PR interval  Anterolateral infarct, age indeterminate  No old tracing to compare  Confirmed by [facility]  MD-J, JON [phone]) on 12/15/2015 7:39:14 PM      MDM   Patient is a 60yo female with a PMHx of CHF, ICD in place who presents for firing of her ICD x3. Troponin 0.14. Hgb 11.1. Elevated BUN and Cr. CXR no acute cardiopulmonary findings. EKG NSR, prolonged PR interval. Dr. Tomi Bamberger spoke with Dr. Philbert Riser in Cardiology who would like to admit the patient for observation. Dr. Tomi Bamberger also evaluated the patient and is in agreement with plan.  Final diagnoses:  AICD discharge      Caryl Ada 12/15/15 2333  Dorie Rank, MD 12/15/15 [phone]

## 2015-12-16 ENCOUNTER — Encounter (HOSPITAL_COMMUNITY): Payer: Self-pay

## 2015-12-16 ENCOUNTER — Other Ambulatory Visit: Payer: Self-pay | Admitting: *Deleted

## 2015-12-16 ENCOUNTER — Other Ambulatory Visit (HOSPITAL_COMMUNITY): Payer: Self-pay

## 2015-12-16 ENCOUNTER — Observation Stay (HOSPITAL_COMMUNITY): Payer: Commercial Managed Care - HMO

## 2015-12-16 ENCOUNTER — Encounter: Payer: Self-pay | Admitting: *Deleted

## 2015-12-16 DIAGNOSIS — I472 Ventricular tachycardia, unspecified: Secondary | ICD-10-CM

## 2015-12-16 DIAGNOSIS — R7989 Other specified abnormal findings of blood chemistry: Secondary | ICD-10-CM

## 2015-12-16 DIAGNOSIS — I5022 Chronic systolic (congestive) heart failure: Secondary | ICD-10-CM | POA: Diagnosis not present

## 2015-12-16 DIAGNOSIS — I429 Cardiomyopathy, unspecified: Secondary | ICD-10-CM | POA: Diagnosis not present

## 2015-12-16 DIAGNOSIS — M25551 Pain in right hip: Secondary | ICD-10-CM | POA: Diagnosis not present

## 2015-12-16 DIAGNOSIS — R778 Other specified abnormalities of plasma proteins: Secondary | ICD-10-CM

## 2015-12-16 DIAGNOSIS — I4729 Other ventricular tachycardia: Secondary | ICD-10-CM

## 2015-12-16 LAB — BRAIN NATRIURETIC PEPTIDE: B Natriuretic Peptide: 516.3 pg/mL — ABNORMAL HIGH (ref 0.0–100.0)

## 2015-12-16 LAB — BASIC METABOLIC PANEL
ANION GAP: 8 (ref 5–15)
BUN: 23 mg/dL — AB (ref 6–20)
CALCIUM: 9.2 mg/dL (ref 8.9–10.3)
CO2: 24 mmol/L (ref 22–32)
CREATININE: 1.26 mg/dL — AB (ref 0.44–1.00)
Chloride: 105 mmol/L (ref 101–111)
GFR, EST AFRICAN AMERICAN: 53 mL/min — AB (ref >60)
GFR, EST NON AFRICAN AMERICAN: 45 mL/min — AB (ref >60)
GLUCOSE: 120 mg/dL — AB (ref 65–99)
Potassium: 4.2 mmol/L (ref 3.5–5.1)
Sodium: 137 mmol/L (ref 135–145)

## 2015-12-16 LAB — TROPONIN I
TROPONIN I: 1.27 ng/mL — AB (ref <0.031)
Troponin I: 1.46 ng/mL (ref <0.031)
Troponin I: 1.63 ng/mL (ref <0.031)

## 2015-12-16 LAB — MAGNESIUM: MAGNESIUM: 2.1 mg/dL (ref 1.7–2.4)

## 2015-12-16 LAB — T4, FREE: Free T4: 0.89 ng/dL (ref 0.61–1.12)

## 2015-12-16 LAB — TSH: TSH: 4.853 u[IU]/mL — AB (ref 0.350–4.500)

## 2015-12-16 MED ORDER — ACETAMINOPHEN 325 MG PO TABS
650.0000 mg | ORAL_TABLET | ORAL | Status: DC | PRN
  Administered 2015-12-16: 650 mg via ORAL
  Filled 2015-12-16: qty 2

## 2015-12-16 MED ORDER — ONDANSETRON HCL 4 MG/2ML IJ SOLN
4.0000 mg | Freq: Four times a day (QID) | INTRAMUSCULAR | Status: DC | PRN
  Filled 2015-12-16: qty 2

## 2015-12-16 MED ORDER — LISINOPRIL 10 MG PO TABS
10.0000 mg | ORAL_TABLET | Freq: Every day | ORAL | Status: DC
  Administered 2015-12-16: 10 mg via ORAL
  Filled 2015-12-16 (×3): qty 1

## 2015-12-16 MED ORDER — MAGNESIUM OXIDE 400 (241.3 MG) MG PO TABS
400.0000 mg | ORAL_TABLET | Freq: Every day | ORAL | Status: DC
  Administered 2015-12-16 – 2015-12-17 (×2): 400 mg via ORAL
  Filled 2015-12-16 (×2): qty 1

## 2015-12-16 MED ORDER — CARVEDILOL 12.5 MG PO TABS: 15.6250 mg | ORAL_TABLET | Freq: Two times a day (BID) | ORAL | Status: DC

## 2015-12-16 MED ORDER — SPIRONOLACTONE 25 MG PO TABS
25.0000 mg | ORAL_TABLET | Freq: Every day | ORAL | Status: DC
  Administered 2015-12-16 – 2015-12-17 (×2): 25 mg via ORAL
  Filled 2015-12-16 (×3): qty 1

## 2015-12-16 MED ORDER — HYDRALAZINE HCL 25 MG PO TABS
37.5000 mg | ORAL_TABLET | Freq: Three times a day (TID) | ORAL | Status: DC
  Administered 2015-12-17: 37.5 mg via ORAL
  Filled 2015-12-16 (×3): qty 2

## 2015-12-16 MED ORDER — OXYCODONE-ACETAMINOPHEN 5-325 MG PO TABS
1.0000 | ORAL_TABLET | ORAL | Status: DC | PRN
Start: 2015-12-16 — End: 2015-12-17
  Administered 2015-12-16: 1 via ORAL
  Filled 2015-12-16 (×2): qty 1

## 2015-12-16 MED ORDER — ISOSORBIDE MONONITRATE ER 30 MG PO TB24
30.0000 mg | ORAL_TABLET | Freq: Every day | ORAL | Status: DC
  Administered 2015-12-16 – 2015-12-17 (×2): 30 mg via ORAL
  Filled 2015-12-16 (×3): qty 1

## 2015-12-16 MED ORDER — FUROSEMIDE 20 MG PO TABS
30.0000 mg | ORAL_TABLET | Freq: Two times a day (BID) | ORAL | Status: DC
  Administered 2015-12-16 – 2015-12-17 (×3): 30 mg via ORAL
  Filled 2015-12-16 (×3): qty 2

## 2015-12-16 MED ORDER — ACETAMINOPHEN 325 MG PO TABS: 650.0000 mg | ORAL_TABLET | Freq: Four times a day (QID) | ORAL | Status: DC | PRN

## 2015-12-16 MED ORDER — CARVEDILOL 3.125 MG PO TABS: 3.1250 mg | ORAL_TABLET | Freq: Two times a day (BID) | ORAL | Status: DC

## 2015-12-16 MED ORDER — TRAMADOL HCL 50 MG PO TABS
50.0000 mg | ORAL_TABLET | Freq: Four times a day (QID) | ORAL | Status: DC | PRN
Start: 2015-12-16 — End: 2015-12-17
  Administered 2015-12-16 (×4): 50 mg via ORAL
  Filled 2015-12-16 (×4): qty 1

## 2015-12-16 MED ORDER — CARVEDILOL 6.25 MG PO TABS
18.7500 mg | ORAL_TABLET | Freq: Two times a day (BID) | ORAL | Status: DC
  Administered 2015-12-16 – 2015-12-17 (×2): 18.75 mg via ORAL
  Filled 2015-12-16 (×3): qty 1

## 2015-12-16 MED ORDER — CARVEDILOL 12.5 MG PO TABS: 12.5000 mg | ORAL_TABLET | Freq: Two times a day (BID) | ORAL | Status: DC

## 2015-12-16 MED ORDER — PNEUMOCOCCAL VAC POLYVALENT 25 MCG/0.5ML IJ INJ: 0.5000 mL | INJECTION | INTRAMUSCULAR | Status: DC

## 2015-12-16 MED ORDER — RIVAROXABAN 20 MG PO TABS
20.0000 mg | ORAL_TABLET | Freq: Every day | ORAL | Status: DC
  Administered 2015-12-16: 20 mg via ORAL
  Filled 2015-12-16: qty 1

## 2015-12-16 NOTE — Progress Notes (Addendum)
Patient BP 86/42. Pt asymptomatic currently, but says she felt light headed while eating dinner. Cardiology PA made aware. Held Coreg, Hydralazine, and Furosemide until further orders given. Will continue to monitor.  Given verbal order to hold Coreg & Hydralazine. Furosemide given.   Fritz Pickerel, RN

## 2015-12-16 NOTE — ED Notes (Signed)
Patient transported to X-ray 

## 2015-12-16 NOTE — Care Management Obs Status (Signed)
[place] NOTIFICATION   Patient Details  Name: Claudia [name] MRN: [id number] Date of Birth: [birth year]   Medicare Observation Status Notification Given:  Yes    Dawayne Patricia, RN 12/16/2015, 4:04 PM

## 2015-12-16 NOTE — Progress Notes (Signed)
Patient Name: Claudia [name]      SUBJECTIVE:complanints of hip pain following the fall. No hx of SOB   Device was implanted following removal of a transvenous device secondary to infection. We do not have data as relates to primary versus secondary factors. On visual assessment yesterday primary secondaryvecto mediciners appear quite similar morphologically; the alternate vector is small and is not an alternative Past Medical History  Diagnosis Date  . Breast cancer ([place])   . CHF (congestive heart failure) ([place])   . AICD (automatic cardioverter/defibrillator) present 09/15/2014  . Implantable cardioverter-defibrillator infection  s/p extraction 10/07/2014    Scheduled Meds:  Scheduled Meds: . carvedilol  15.625 mg Oral BID WC  . furosemide  30 mg Oral BID  . hydrALAZINE  37.5 mg Oral TID  . isosorbide mononitrate  30 mg Oral Daily  . lisinopril  10 mg Oral Daily  . rivaroxaban  20 mg Oral Q supper   Continuous Infusions:  acetaminophen, ondansetron (ZOFRAN) IV, traMADol    PHYSICAL EXAM Filed Vitals:   12/15/15 2330 12/16/15 0000 12/16/15 0051 12/16/15 0500  BP: 113/75 113/71 111/62 112/59  Pulse: 70 64 71 71  Temp:   98 F (36.7 C) 97.9 F (36.6 C)  TempSrc:   Oral Oral  Resp: 11 15 18 18   Height:      Weight:      SpO2: 99% 100% 100% 100%    Well developed and nourished in no acute distress HENT normal Neck supple with JVP-flat Clear Regular rate and rhythm, no murmurs or gallops Abd-soft with active BS No Clubbing cyanosis edema Skin-warm and dry A & Oriented  Grossly normal sensory and motor function Pain over the femoral head with no soft tissue tenderness  TELEMETRY: Reviewed telemetry pt in NSR with VT-NS:   No intake or output data in the 24 hours ending 12/16/15 0721  LABS: Basic Metabolic Panel:  Recent Labs Lab 12/15/15 1954 12/15/15 2029 12/16/15 0420  NA 137  --  137  K 3.8  --  4.2  CL 105  --  105  CO2 21*  --  24    GLUCOSE 121*  --  120*  BUN 27*  --  23*  CREATININE 1.18*  --  1.26*  CALCIUM 8.7*  --  9.2  MG  --  1.6* 2.1   Cardiac Enzymes:  Recent Labs  12/16/15 0118 12/16/15 0420  TROPONINI 1.46* 1.63*   CBC:  Recent Labs Lab 12/15/15 1954  WBC 5.0  NEUTROABS 2.9  HGB 11.1*  HCT 34.5*  MCV 82.5  PLT 256   PROTIME: No results for input(s): LABPROT, INR in the last 72 hours. Liver Function Tests: No results for input(s): AST, ALT, ALKPHOS, BILITOT, PROT, ALBUMIN in the last 72 hours. No results for input(s): LIPASE, AMYLASE in the last 72 hours. BNP: BNP (last 3 results) No results for input(s): BNP in the last 8760 hours.  ProBNP (last 3 results) No results for input(s): PROBNP in the last 8760 hours.  D Thyroid Function Tests:  Recent Labs  12/16/15 0420  TSH 4.853*   Anemia Panel: No results for input(s): VITAMINB12, FOLATE, FERRITIN, TIBC, IRON, RETICCTPCT in the last 72 hours.   Device Interrogation: slow VT with undersensing to start, and oversensing resulting in shock therapy as otherwise would have been below the detection zaone  Failed to terminate VT cleanly   ASSESSMENT AND PLAN:  Active Problems:   Nonischemic cardiomyopathy (  [place])   ICD (implantable cardioverter-defibrillator) discharge   Paroxysmal ventricular tachycardia ([place])  No great options for reprogramming but will do outpt reassessment of primary secondary Will change detection to 250 so As to decrease the likelihood that her VT and overcounting ( [facility])    will results in detection and therapy for VTs that at least on this occasion and apparently in Tx were often spontaneously terminating Will iincrease coreg Check CT for soft tissue hematoma Mobilize with pain meds       Signed, Virl Axe MD  12/16/2015

## 2015-12-16 NOTE — Consult Note (Signed)
   [facility] CM Inpatient Consult   12/16/2015  Claudia [name] 10/06/1955 [id number]   Patient evaluated for long-term disease management services with [place] Management program. Martin Majestic to bedside to discuss and offer [place] Management services. Patient is agreeable and written consent signed. Explained to patient that she will receive post hospital transition of care calls and will be evaluated for monthly home visits. Confirmed Primary Care MD as Dr. Iona Beard.  Confirmed best contact number as [phone]. Patient states she lives alone and needs information regarding Life Alert. Writer printed out multiple Life Alert type agencies with contact numbers for patient to call. Patient denies having issues with affording medications or with transportation. Primary focus will be CHF disease management and education. Patient agreeable to this. Left [facility] Care Management packet and contact information at bedside. Made inpatient RNCM aware that patient will be followed by [place] Management post hospital discharge.   Marthenia Rolling, MSN-Ed, RN,BSN [facility] Liaison [phone]

## 2015-12-16 NOTE — Consult Note (Signed)
Advanced Heart Failure Rounding Note   Subjective:   Admitted after ICD discharge x3 for VT. EP following. BB increased and device is being reprogrammed.   Prior to admit she has been doing well. Compliant with meds and exercising 5 days a week.   Denies SOB. Complaining of R hip pain.   K 4.2 Mag 2.1  Objective:   Weight Range:  Vital Signs:   Temp:  [97.9 F (36.6 C)-99.3 F (37.4 C)] 97.9 F (36.6 C) (03/17 0500) Pulse Rate:  [64-97] 71 (03/17 0500) Resp:  [11-22] 18 (03/17 0500) BP: (102-138)/(59-95) 112/59 mmHg (03/17 0500) SpO2:  [99 %-100 %] 100 % (03/17 0500) Weight:  [184 lb (83.462 kg)] 184 lb (83.462 kg) (03/16 1930) Last BM Date: 12/15/15  Weight change: Filed Weights   12/15/15 1930  Weight: 184 lb (83.462 kg)    Intake/Output:  No intake or output data in the 24 hours ending 12/16/15 0749   Physical Exam: General:  Fatigued appearing. No resp difficulty. In bed  HEENT: normal Neck: supple. JVP 5-6. Carotids 2+ bilat; no bruits. No lymphadenopathy or thryomegaly appreciated. Cor: PMI nondisplaced. Regular rate & rhythm. No rubs, gallops or murmurs. Lungs: clear Abdomen: soft, nontender, nondistended. No hepatosplenomegaly. No bruits or masses. Good bowel sounds. Extremities: no cyanosis, clubbing, rash, edema Neuro: alert & orientedx3, cranial nerves grossly intact. moves all 4 extremities w/o difficulty. Affect pleasant  Telemetry: NSR 70s   Labs: Basic Metabolic Panel:  Recent Labs Lab 12/15/15 1954 12/15/15 2029 12/16/15 0420  NA 137  --  137  K 3.8  --  4.2  CL 105  --  105  CO2 21*  --  24  GLUCOSE 121*  --  120*  BUN 27*  --  23*  CREATININE 1.18*  --  1.26*  CALCIUM 8.7*  --  9.2  MG  --  1.6* 2.1    Liver Function Tests: No results for input(s): AST, ALT, ALKPHOS, BILITOT, PROT, ALBUMIN in the last 168 hours. No results for input(s): LIPASE, AMYLASE in the last 168 hours. No results for input(s): AMMONIA in the last 168  hours.  CBC:  Recent Labs Lab 12/15/15 1954  WBC 5.0  NEUTROABS 2.9  HGB 11.1*  HCT 34.5*  MCV 82.5  PLT 256    Cardiac Enzymes:  Recent Labs Lab 12/16/15 0118 12/16/15 0420  TROPONINI 1.46* 1.63*    BNP: BNP (last 3 results) No results for input(s): BNP in the last 8760 hours.  ProBNP (last 3 results) No results for input(s): PROBNP in the last 8760 hours.    Other results:  Imaging: Dg Chest Portable 1 View  12/15/2015  CLINICAL DATA:  Defibrillator fired 3 times today. Initial encounter. EXAM: PORTABLE CHEST 1 VIEW COMPARISON:  None. FINDINGS: The lungs are well-aerated and clear. There is no evidence of focal opacification, pleural effusion or pneumothorax. The cardiomediastinal silhouette is within normal limits. No acute osseous abnormalities are seen. A metallic device is noted overlying the left lower chest wall, with a single lead ending overlying the mediastinum. IMPRESSION: No acute cardiopulmonary process seen. Electronically Signed   By: Garald Balding M.D.   On: 12/15/2015 22:02   Dg Hip Unilat With Pelvis 2-3 Views Right  12/16/2015  CLINICAL DATA:  Fall with right hip pain.  Initial encounter. EXAM: DG HIP (WITH OR WITHOUT PELVIS) 2-3V RIGHT COMPARISON:  None. FINDINGS: There is no evidence of hip fracture or dislocation. No evidence of pelvic ring fracture  or diastasis. IMPRESSION: Negative. Electronically Signed   By: Monte Fantasia M.D.   On: 12/16/2015 00:22      Medications:     Scheduled Medications: . carvedilol  18.75 mg Oral BID WC  . furosemide  30 mg Oral BID  . hydrALAZINE  37.5 mg Oral TID  . isosorbide mononitrate  30 mg Oral Daily  . lisinopril  10 mg Oral Daily  . rivaroxaban  20 mg Oral Q supper     Infusions:     PRN Medications:  acetaminophen, ondansetron (ZOFRAN) IV, oxyCODONE-acetaminophen, traMADol   Assessment:  1. VT- ICD dischargex3  2. R hip pain 3. Chronic Systolic Heart Failure 4. . H/O PAF 5. H/O  Breast Cancer- completed treatment 2008  6. H/O DVT-  7. hypomagnesemia  Plan/Discussion:   Admitted after ICD discharged x3 for VT. Carvedilol has been increased and device to be reprogrammed today. Electrolytes stable.   R hip pain- no fracture. CT today for further evaluation  From HF perspective she is stable. Check ECHO. Volume status stable. Continue carvedilol, lisinopril, hydralazine/imudur and lasix.  Restart spiro 25 mg daily.    On Xarelto for PAF and H/O DVT  TSH 4.8 Check T3 T4.    Length of Stay:    Amy Clegg NP-C  12/16/2015, 7:49 AM  Advanced Heart Failure Team Pager [phone] (M-F; 7a - 4p)  Please contact [facility] for night-coverage after hours (4p -7a ) and weekends on [email or web address]   Patient seen and examined with Darrick Grinder, NP. We discussed all aspects of the encounter. I agree with the assessment and plan as stated above.   She is stable from HF perspective. EP managing VT. Agree with repeat echo. If EF <= 40% will switch lisinopril to Entresto. Carvedilol increased. Mag was low on admit and may benefit from some chronic magnesium supplementation with mag oxide 400mg  daily.   HF team will sign off. I will forward this note to Dr. Lindi Adie so that we can get her Oncology f/u in [place].  Katja Blue,MD 12:00 PM

## 2015-12-16 NOTE — Progress Notes (Signed)
Pt admitted from ED with c/o of AICD shocks,pt alert and oriented, c/o of pain in her right hip, pt settled in bed with call light at bedside, was however reassured and will continue to monitor. Obasogie-Asidi, Leonard Feigel Efe

## 2015-12-16 NOTE — Progress Notes (Signed)
CRITICAL VALUE ALERT  Critical value received: troponin 1.46  Date of notification: 12/16/15  Time of notification: 0220  Critical value read back:Yes.    Nurse who received alert:  [place]  MD notified (1st page): Dr Philbert Riser  Time of first page:  0222  MD notified (2nd page):NA  Time of second page:NA  Responding MD:Dr Philbert Riser  Time MD responded: [phone]

## 2015-12-16 NOTE — Progress Notes (Signed)
Got called from [place] that pt had 5 runs of V-tach at [place], pt resting quietly in bed, denies any chest discomfort, Dr Philbert Riser text-paged to be notified, no new orders at this time, pt reassured, will continue to monitor. Claudia [name], Claudia [name]

## 2015-12-16 NOTE — Discharge Instructions (Signed)
Information on my medicine - XARELTO (rivaroxaban)  This medication education was reviewed with me or my healthcare representative as part of my discharge preparation.  The pharmacist that spoke with me during my hospital stay was:  Wayland Salinas, [place]? Xarelto was prescribed to treat blood clots that may have been found in the veins of your legs (deep vein thrombosis) or in your lungs (pulmonary embolism) and to reduce the risk of them occurring again.  What do you need to know about Xarelto? The dose is one 20 mg tablet taken ONCE A DAY with your evening meal.  DO NOT stop taking Xarelto without talking to the health care provider who prescribed the medication.  Refill your prescription for 20 mg tablets before you run out.  After discharge, you should have regular check-up appointments with your healthcare provider that is prescribing your Xarelto.  In the future your dose may need to be changed if your kidney function changes by a significant amount.  What do you do if you miss a dose?  If you are taking Xarelto ONCE DAILY and you miss a dose, take it as soon as you remember on the same day then continue your regularly scheduled once daily regimen the next day. Do not take two doses of Xarelto at the same time.   Important Safety Information Xarelto is a blood thinner medicine that can cause bleeding. You should call your healthcare provider right away if you experience any of the following: ? Bleeding from an injury or your nose that does not stop. ? Unusual colored urine (red or dark brown) or unusual colored stools (red or black). ? Unusual bruising for unknown reasons. ? A serious fall or if you hit your head (even if there is no bleeding).  Some medicines may interact with Xarelto and might increase your risk of bleeding while on Xarelto. To help avoid this, consult your healthcare provider or pharmacist prior to using  any new prescription or non-prescription medications, including herbals, vitamins, non-steroidal anti-inflammatory drugs (NSAIDs) and supplements.  This website has more information on Xarelto: [email or web address]

## 2015-12-16 NOTE — Progress Notes (Signed)
Paged Tommye Standard about patient's 10AM medications. Pt BP 98/64 asymptomatic. Renee was to check with another NP. Call back received verbal order to hold hydralazine, but administer imdur, spironolactone, and lisinopril.   Fritz Pickerel, RN

## 2015-12-17 ENCOUNTER — Observation Stay (HOSPITAL_BASED_OUTPATIENT_CLINIC_OR_DEPARTMENT_OTHER): Payer: Commercial Managed Care - HMO

## 2015-12-17 DIAGNOSIS — I509 Heart failure, unspecified: Secondary | ICD-10-CM

## 2015-12-17 DIAGNOSIS — I472 Ventricular tachycardia: Secondary | ICD-10-CM | POA: Diagnosis not present

## 2015-12-17 DIAGNOSIS — Z0389 Encounter for observation for other suspected diseases and conditions ruled out: Secondary | ICD-10-CM | POA: Diagnosis not present

## 2015-12-17 DIAGNOSIS — R7989 Other specified abnormal findings of blood chemistry: Secondary | ICD-10-CM | POA: Diagnosis not present

## 2015-12-17 DIAGNOSIS — I429 Cardiomyopathy, unspecified: Secondary | ICD-10-CM | POA: Diagnosis not present

## 2015-12-17 LAB — ECHOCARDIOGRAM COMPLETE
Height: 66 in
Weight: 2944 oz

## 2015-12-17 LAB — T3, FREE: T3 FREE: 2.5 pg/mL (ref 2.0–4.4)

## 2015-12-17 MED ORDER — CARVEDILOL 12.5 MG PO TABS: 18.7500 mg | ORAL_TABLET | Freq: Two times a day (BID) | ORAL | Status: DC

## 2015-12-17 MED ORDER — TRAMADOL HCL 50 MG PO TABS: 50.0000 mg | ORAL_TABLET | Freq: Four times a day (QID) | ORAL | Status: DC | PRN

## 2015-12-17 MED ORDER — MAGNESIUM OXIDE 400 (241.3 MG) MG PO TABS: 400.0000 mg | ORAL_TABLET | Freq: Every day | ORAL | Status: DC

## 2015-12-17 NOTE — Progress Notes (Signed)
SUBJECTIVE: The patient is doing well today.  + hip pain but ambulatory.  CT and X ray uneventful.  Dizziness yesterday has resolved.  She attributes this to percocet.  At this time, she denies chest pain, shortness of breath, or any new concerns.  . carvedilol  18.75 mg Oral BID WC  . furosemide  30 mg Oral BID  . hydrALAZINE  37.5 mg Oral TID  . isosorbide mononitrate  30 mg Oral Daily  . lisinopril  10 mg Oral Daily  . magnesium oxide  400 mg Oral Daily  . pneumococcal 23 valent vaccine  0.5 mL Intramuscular Tomorrow-1000  . rivaroxaban  20 mg Oral Q supper  . spironolactone  25 mg Oral Daily      OBJECTIVE: Physical Exam: Filed Vitals:   12/16/15 1317 12/16/15 1822 12/16/15 2024 12/17/15 0444  BP: 102/55 86/42 100/58 101/44  Pulse: 62 76 71 72  Temp: 98.3 F (36.8 C)  98.2 F (36.8 C) 98 F (36.7 C)  TempSrc: Oral  Oral Oral  Resp: 18  18 18   Height:      Weight:      SpO2: 100%  100% 99%    Intake/Output Summary (Last 24 hours) at 12/17/15 0734 Last data filed at 12/16/15 1700  Gross per 24 hour  Intake    460 ml  Output      0 ml  Net    460 ml    Telemetry reveals sinus rhythm  GEN- The patient is well appearing, alert and oriented x 3 today.   Head- normocephalic, atraumatic Eyes-  Sclera clear, conjunctiva pink Ears- hearing intact Oropharynx- clear Neck- supple,   Lungs- Clear to ausculation bilaterally, normal work of breathing Heart- Regular rate and rhythm, no murmurs, rubs or gallops, PMI not laterally displaced GI- soft, NT, ND, + BS Extremities- no clubbing, cyanosis, or edema Skin- no rash or lesion Psych- euthymic mood, full affect Neuro- strength and sensation are intact  LABS: Basic Metabolic Panel:  Recent Labs  12/15/15 1954 12/15/15 2029 12/16/15 0420  NA 137  --  137  K 3.8  --  4.2  CL 105  --  105  CO2 21*  --  24  GLUCOSE 121*  --  120*  BUN 27*  --  23*  CREATININE 1.18*  --  1.26*  CALCIUM 8.7*  --  9.2  MG  --   1.6* 2.1   Liver Function Tests: No results for input(s): AST, ALT, ALKPHOS, BILITOT, PROT, ALBUMIN in the last 72 hours. No results for input(s): LIPASE, AMYLASE in the last 72 hours. CBC:  Recent Labs  12/15/15 1954  WBC 5.0  NEUTROABS 2.9  HGB 11.1*  HCT 34.5*  MCV 82.5  PLT 256   Cardiac Enzymes:  Recent Labs  12/16/15 0118 12/16/15 0420 12/16/15 0757  TROPONINI 1.46* 1.63* 1.27*   Thyroid Function Tests:  Recent Labs  12/16/15 0420 12/16/15 0854  TSH 4.853*  --   T3FREE  --  2.5   Anemia Panel: No results for input(s): VITAMINB12, FOLATE, FERRITIN, TIBC, IRON, RETICCTPCT in the last 72 hours.  RADIOLOGY: Ct Pelvis Wo Contrast  12/16/2015  CLINICAL DATA:  Fall, landing on right buttock region.  Soreness. EXAM: CT PELVIS WITHOUT CONTRAST TECHNIQUE: Multidetector CT imaging of the pelvis was performed following the standard protocol without intravenous contrast. COMPARISON:  None. FINDINGS: No acute bony abnormality. Joint spaces and SI joints are symmetric and unremarkable. No fracture. Visualized large and small  bowel are unremarkable. Appendix is normal. No free fluid, free air or adenopathy. IMPRESSION: No acute bony abnormality. Electronically Signed   By: Rolm Baptise M.D.   On: 12/16/2015 12:26   Dg Chest Portable 1 View  12/15/2015  CLINICAL DATA:  Defibrillator fired 3 times today. Initial encounter. EXAM: PORTABLE CHEST 1 VIEW COMPARISON:  None. FINDINGS: The lungs are well-aerated and clear. There is no evidence of focal opacification, pleural effusion or pneumothorax. The cardiomediastinal silhouette is within normal limits. No acute osseous abnormalities are seen. A metallic device is noted overlying the left lower chest wall, with a single lead ending overlying the mediastinum. IMPRESSION: No acute cardiopulmonary process seen. Electronically Signed   By: Garald Balding M.D.   On: 12/15/2015 22:02   Dg Hip Unilat With Pelvis 2-3 Views Right  12/16/2015   CLINICAL DATA:  Fall with right hip pain.  Initial encounter. EXAM: DG HIP (WITH OR WITHOUT PELVIS) 2-3V RIGHT COMPARISON:  None. FINDINGS: There is no evidence of hip fracture or dislocation. No evidence of pelvic ring fracture or diastasis. IMPRESSION: Negative. Electronically Signed   By: Monte Fantasia M.D.   On: 12/16/2015 00:22    ASSESSMENT AND PLAN:  Active Problems:   Nonischemic cardiomyopathy ([place])   ICD (implantable cardioverter-defibrillator) discharge   Paroxysmal ventricular tachycardia (HCC)   Elevated troponin  1. VT S/p ICD shock Coreg increased by Dr Caryl Comes  No further episodes Pt instructed by me to not drive x 6 months (informed of DMV driving restriction) May need AAD therapy if further VT  2. + troponin Likely due to ICD shocks No further inpatient workup planned  3. Nonischemic CM Follow-up echo as per Dr Haroldine Laws not yet done Can be done as an outpatient Given recent dizziness and hypotension, I would not initiate entresto this admission This can be started electively in the office once we know that VT is controlled and that she is tolerating increases in coreg  4. Hip pain Exam uneventful Ct and plain films reviewed Ambulation encouraged Follow-up with PCP if not improved  5. Prior DVT Resume xarelto  Discharge to home Follow-up with Dr Caryl Comes in 2-4 weeks  [name] Grayer, MD 12/17/2015 7:34 AM

## 2015-12-17 NOTE — Discharge Summary (Signed)
Discharge Summary    Patient ID: Claudia [name],  MRN: [id number], DOB/AGE: [birth year] 61 y.o.  Admit date: 12/15/2015 Discharge date: 12/17/2015  Primary Care Provider: Maggie Font Primary Cardiologist: D. Bensimhon, MD / S. Caryl Comes, MD   Discharge Diagnoses    Principal Problem:   Paroxysmal ventricular tachycardia (HCC)3  **s/p ICD shock prior to admission.  ** blocker titrated. Active Problems:   Chronic systolic heart failure (HCC)   Nonischemic cardiomyopathy (HCC)   ICD (implantable cardioverter-defibrillator) discharge   PAF (paroxysmal atrial fibrillation) (HCC)  **Chronic Xarelto   History of DVT (deep vein thrombosis)   Elevated troponin  **2/2 ICD shocks.   Allergies No Known Allergies  Diagnostic Studies/Procedures    2D Echocardiogram 3.18.2017  Final interpretation pending _____________   History of Present Illness     60 y/o ? with a h/o NICM s/p ICD, chronic systolic CHF, and PAF on chronic xarelto anticoagulation.  She was in her usual state of health until the evening of 12/15/2015, when she was sitting and watching TV and her defibrillator fired suddenly.  She then got up to call her daughter and it fired a second and then a third time.  She was not having chest pain, sob, presyncope, or syncope either prior to or after the defibrillations, however she did fall following the third shock and noted right hip pain.  She then presented to the [facility] ED for evaluation.  Hip films and CT of pelvis were negative for acute process.  She was seen by cardiology and admitted for further evaluation.   Hospital Course     Consultants: CHF Team   Her device was interrogated and revealed three device discharges for slow ventricular tachycardia with initial undersensing followed by oversensing and shock.  She was seen by electrophysiology and her  blocker blocker dose was titrated.  VT detection was changed to 250 in order to decrease the likelihood that her VT will  result in detection and therapy, as her VT appeared to spontaneously terminate otherwise.  She has had no further VT and is felt to be stable for discharge this AM.  It is felt that if she has further VT, she may require antiarrhythmic drug therapy.  She will be Rx Tramadol at discharge for ongoing leg pain from her fall.  She will f/u in EP clinic in 2-4 wks. _____________  Discharge Vitals Blood pressure 112/60, pulse 72, temperature 98 F (36.7 C), temperature source Oral, resp. rate 18, height 5\' 6"  (1.676 m), weight 184 lb (83.462 kg), SpO2 99 %.  Filed Weights   12/15/15 1930  Weight: 184 lb (83.462 kg)    Labs & Radiologic Studies     CBC  Recent Labs  12/15/15 1954  WBC 5.0  NEUTROABS 2.9  HGB 11.1*  HCT 34.5*  MCV 82.5  PLT 123456   Basic Metabolic Panel  Recent Labs  12/15/15 1954 12/15/15 2029 12/16/15 0420  NA 137  --  137  K 3.8  --  4.2  CL 105  --  105  CO2 21*  --  24  GLUCOSE 121*  --  120*  BUN 27*  --  23*  CREATININE 1.18*  --  1.26*  CALCIUM 8.7*  --  9.2  MG  --  1.6* 2.1   Cardiac Enzymes  Recent Labs  12/16/15 0118 12/16/15 0420 12/16/15 0757  TROPONINI 1.46* 1.63* 1.27*   Thyroid Function Tests  Recent Labs  12/16/15 0420 12/16/15  0854  TSH 4.853*  --   T3FREE  --  2.5    Ct Pelvis Wo Contrast  12/16/2015  CLINICAL DATA:  Fall, landing on right buttock region.  Soreness. EXAM: CT PELVIS WITHOUT CONTRAST TECHNIQUE: Multidetector CT imaging of the pelvis was performed following the standard protocol without intravenous contrast. COMPARISON:  None. FINDINGS: No acute bony abnormality. Joint spaces and SI joints are symmetric and unremarkable. No fracture. Visualized large and small bowel are unremarkable. Appendix is normal. No free fluid, free air or adenopathy. IMPRESSION: No acute bony abnormality. Electronically Signed   By: Rolm Baptise M.D.   On: 12/16/2015 12:26   Dg Chest Portable 1 View  12/15/2015  CLINICAL DATA:   Defibrillator fired 3 times today. Initial encounter. EXAM: PORTABLE CHEST 1 VIEW COMPARISON:  None. FINDINGS: The lungs are well-aerated and clear. There is no evidence of focal opacification, pleural effusion or pneumothorax. The cardiomediastinal silhouette is within normal limits. No acute osseous abnormalities are seen. A metallic device is noted overlying the left lower chest wall, with a single lead ending overlying the mediastinum. IMPRESSION: No acute cardiopulmonary process seen. Electronically Signed   By: Garald Balding M.D.   On: 12/15/2015 22:02   Dg Hip Unilat With Pelvis 2-3 Views Right  12/16/2015  CLINICAL DATA:  Fall with right hip pain.  Initial encounter. EXAM: DG HIP (WITH OR WITHOUT PELVIS) 2-3V RIGHT COMPARISON:  None. FINDINGS: There is no evidence of hip fracture or dislocation. No evidence of pelvic ring fracture or diastasis. IMPRESSION: Negative. Electronically Signed   By: Monte Fantasia M.D.   On: 12/16/2015 00:22    Disposition   Pt is being discharged home today in good condition.  Follow-up Plans & Appointments    Follow-up Information    Follow up with Virl Axe, MD.   Specialty:  Cardiology   Why:  we will arrange for follow-up in 2-4 wks and contact.   Contact information:   [id number] [place] [address] [phone]       Discharge Medications   Current Discharge Medication List    START taking these medications   Details  magnesium oxide (MAG-OX) 400 (241.3 Mg) MG tablet Take 1 tablet (400 mg total) by mouth daily. Qty: 30 tablet, Refills: 6    traMADol (ULTRAM) 50 MG tablet Take 1 tablet (50 mg total) by mouth every 6 (six) hours as needed for moderate pain. Qty: 30 tablet, Refills: 0      CONTINUE these medications which have CHANGED   Details  carvedilol (COREG) 12.5 MG tablet Take 1.5 tablets (18.75 mg total) by mouth 2 (two) times daily with a meal. Qty: 45 tablet, Refills: 6      CONTINUE these  medications which have NOT CHANGED   Details  acetaminophen (TYLENOL) 325 MG tablet Take 650 mg by mouth every 6 (six) hours as needed.    eplerenone (INSPRA) 25 MG tablet Take 25 mg by mouth daily.    furosemide (LASIX) 40 MG tablet TAKE 1 TABLET IN THE MORNING AND TAKE 1/2 TABLET   IN THE EVENING Qty: 135 tablet, Refills: 3    hydrALAZINE (APRESOLINE) 25 MG tablet Take 37.5 mg by mouth 3 (three) times daily. Take 1 and 1/2 tablet daily by mouth    isosorbide mononitrate (IMDUR) 30 MG 24 hr tablet Take one tablet (30 mg) by mouth at night Qty: 90 tablet, Refills: 3    lisinopril (PRINIVIL,ZESTRIL) 10 MG tablet TAKE 1  TABLET EVERY DAY Qty: 90 tablet, Refills: 3    XARELTO 20 MG TABS tablet TAKE 1 TABLET EVERY DAY  WITH  SUPPER Qty: 90 tablet, Refills: 3         Outstanding Labs/Studies   None  Duration of Discharge Encounter   Greater than 30 minutes including physician time.  Signed, Murray Hodgkins NP 12/17/2015, 11:53 AM    [name] Grayer MD, [facility]

## 2015-12-19 ENCOUNTER — Other Ambulatory Visit: Payer: Self-pay | Admitting: *Deleted

## 2015-12-19 NOTE — Patient Outreach (Signed)
Referral received from hospital liaison to initiate transition of care program once member discharged from hospital.  She was recently admitted because her AICD discharged, and with a diagnosis of congestive heart failure.  According to chart, she also has a history of atrial fibrillation and breast cancer.  Call placed to member, she state that she is not "100%, but feeling better.  Today is the first day I've been back up to 80%."  She report that she has managed her heart failure well over the past several years, but was in the hospital this time because her AICD fired.  She state that she has been following a strict diet, "eating healthy, low salt, fruits and vegetables" and has also been attending nutrition classes at her church with a nutritionist.  She state that she exercise on a regular basis, and does not maintain a sedentary lifestyle.  She reports weighing herself daily, and is aware of the heart failure zones and when to notify the physician. She denies any concerns or questions at this time.  Contact information provided.  Will continue with weekly calls next week.  Initial home visit scheduled for 4/4.  Valente David, BSN, [facility]  [facility] Care Manager [phone]

## 2015-12-20 ENCOUNTER — Encounter: Payer: Self-pay | Admitting: *Deleted

## 2015-12-21 ENCOUNTER — Telehealth: Payer: Self-pay | Admitting: Hematology and Oncology

## 2015-12-21 NOTE — Telephone Encounter (Signed)
Left message for patient to return call to schedule appt

## 2015-12-27 ENCOUNTER — Ambulatory Visit (HOSPITAL_COMMUNITY)
Admission: RE | Admit: 2015-12-27 | Discharge: 2015-12-27 | Disposition: A | Discharge status: Home / Self Care | Payer: Commercial Managed Care - HMO | Source: Ambulatory Visit | Attending: Internal Medicine | Admitting: Internal Medicine

## 2015-12-27 VITALS — BP 124/72 | HR 80 | Wt 189.6 lb

## 2015-12-27 DIAGNOSIS — I480 Paroxysmal atrial fibrillation: Secondary | ICD-10-CM | POA: Diagnosis not present

## 2015-12-27 DIAGNOSIS — I5022 Chronic systolic (congestive) heart failure: Secondary | ICD-10-CM | POA: Insufficient documentation

## 2015-12-27 DIAGNOSIS — I4729 Other ventricular tachycardia: Secondary | ICD-10-CM

## 2015-12-27 DIAGNOSIS — Z86718 Personal history of other venous thrombosis and embolism: Secondary | ICD-10-CM | POA: Insufficient documentation

## 2015-12-27 DIAGNOSIS — Z7901 Long term (current) use of anticoagulants: Secondary | ICD-10-CM | POA: Diagnosis not present

## 2015-12-27 DIAGNOSIS — I472 Ventricular tachycardia: Secondary | ICD-10-CM | POA: Diagnosis not present

## 2015-12-27 DIAGNOSIS — Z833 Family history of diabetes mellitus: Secondary | ICD-10-CM | POA: Insufficient documentation

## 2015-12-27 DIAGNOSIS — Z87891 Personal history of nicotine dependence: Secondary | ICD-10-CM | POA: Insufficient documentation

## 2015-12-27 DIAGNOSIS — C50412 Malignant neoplasm of upper-outer quadrant of left female breast: Secondary | ICD-10-CM | POA: Diagnosis not present

## 2015-12-27 DIAGNOSIS — T451X5A Adverse effect of antineoplastic and immunosuppressive drugs, initial encounter: Secondary | ICD-10-CM | POA: Diagnosis not present

## 2015-12-27 DIAGNOSIS — Z95810 Presence of automatic (implantable) cardiac defibrillator: Secondary | ICD-10-CM | POA: Diagnosis not present

## 2015-12-27 DIAGNOSIS — Z853 Personal history of malignant neoplasm of breast: Secondary | ICD-10-CM | POA: Insufficient documentation

## 2015-12-27 DIAGNOSIS — E785 Hyperlipidemia, unspecified: Secondary | ICD-10-CM | POA: Diagnosis not present

## 2015-12-27 DIAGNOSIS — Z79899 Other long term (current) drug therapy: Secondary | ICD-10-CM | POA: Insufficient documentation

## 2015-12-27 NOTE — Progress Notes (Signed)
Patient ID: Claudia [name], female   DOB: 21-Mar-1956, 60 y.o.   MRN: [id number]  Primary Cardiologist: Dr Haroldine Laws PCP: Dr Criss Rosales  Oncology: Dr Lindi Adie.  EP: Dr Lynelle Doctor  HPI: Claudia [name] is a 60 y/o woman with h/o breast cancer in [id number] and systolic HF due to chemotoxicity. She also h/o PAF and previous DVT for which she is on chronic anticoagulation. She moved from [place] in 07/2014 and saw Dr. Johnsie Cancel.   She developed breast cancer in 2008 and underwent chemotherapy and in the wake of this, she developed congestive heart failure with EF 20%. Cardiac cath with normal coronaries. She received an ICD It became infected in the spring of 2015 with staph and she underwent extraction. She has undergone S ICD implantation following prolonged use of a LifeVest. She was ultimately transferred to [facility] in [place] in 01/2014 for consideration of heart transplantation. They diuresed her over 60 pounds and she improved markedly.   March 2017 after  ICD fired x3. Device was reprogrammed and bb was increased 18.75 mg twice a day. ECHO was repeated EF 45%. Dischaege weight 184 pounds.   Today she returns for HF follow up. Denies SOB/PND/Orthopnea. Taking all medications. No ICD fires since discharged.  Not exercising due to R hip pain. Taking all medications.    ECHO 11/2015: EF 45% Grade I DD ECHO 10/07/14 and EF 40-45% with normal RV.   Labs: 08/24/14: Potassium 4.2 Creatinine 1.07 hgb 13.1 01/31/2015: K 4.0 Creatinine 1.28   ROS: All systems negative except as listed in HPI, PMH and Problem List.  SH:  Social History   Social History  . Marital Status: Divorced    Spouse Name: N/A  . Number of Children: N/A  . Years of Education: N/A   Occupational History  . Not on file.   Social History Main Topics  . Smoking status: Former Research scientist (life sciences)  . Smokeless tobacco: Never Used  . Alcohol Use: No  . Drug Use: No  . Sexual Activity: Not on file   Other Topics Concern  . Not on file   Social History Narrative     FH:  Family History  Problem Relation Age of Onset  . Diabetes Mother   . Cancer Father     Past Medical History  Diagnosis Date  . Breast cancer ([place])   . CHF (congestive heart failure) ([place])   . AICD (automatic cardioverter/defibrillator) present 09/15/2014  . Implantable cardioverter-defibrillator infection  s/p extraction 10/07/2014    Current Outpatient Prescriptions  Medication Sig Dispense Refill  . acetaminophen (TYLENOL) 325 MG tablet Take 650 mg by mouth every 6 (six) hours as needed.    . carvedilol (COREG) 12.5 MG tablet Take 1.5 tablets (18.75 mg total) by mouth 2 (two) times daily with a meal. 45 tablet 6  . eplerenone (INSPRA) 25 MG tablet Take 25 mg by mouth daily.    . furosemide (LASIX) 40 MG tablet TAKE 1 TABLET IN THE MORNING AND TAKE 1/2 TABLET   IN THE EVENING 135 tablet 3  . hydrALAZINE (APRESOLINE) 25 MG tablet Take 37.5 mg by mouth 3 (three) times daily. Take 1 and 1/2 tablet daily by mouth    . isosorbide mononitrate (IMDUR) 30 MG 24 hr tablet Take one tablet (30 mg) by mouth at night 90 tablet 3  . lisinopril (PRINIVIL,ZESTRIL) 10 MG tablet TAKE 1 TABLET EVERY DAY 90 tablet 3  . magnesium oxide (MAG-OX) 400 (241.3 Mg) MG tablet Take 1 tablet (400 mg total) by  mouth daily. 30 tablet 6  . traMADol (ULTRAM) 50 MG tablet Take 1 tablet (50 mg total) by mouth every 6 (six) hours as needed for moderate pain. 30 tablet 0  . XARELTO 20 MG TABS tablet TAKE 1 TABLET EVERY DAY  WITH  SUPPER 90 tablet 3   No current facility-administered medications for this encounter.    Filed Vitals:   12/27/15 1353  BP: 124/72  Pulse: 80  Weight: 189 lb 9.6 oz (86.002 kg)  SpO2: 100%    PHYSICAL EXAM:  General:  Well appearing. No resp difficulty. Ambulated in the clinic without difficulty.  HEENT: normal Neck: supple. JVP flat. Carotids 2+ bilaterally; no bruits. No lymphadenopathy or thryomegaly appreciated. Cor: PMI normal. Regular rate & rhythm. No rubs, gallops or  murmurs. Lungs: clear Abdomen: soft, nontender, nondistended. No hepatosplenomegaly. No bruits or masses. Good bowel sounds. Extremities: no cyanosis, clubbing, rash, edema Neuro: alert & orientedx3, cranial nerves grossly intact. Moves all 4 extremities w/o difficulty. Affect pleasant.   ASSESSMENT & PLAN:  1. Chronic systolic HF - due to chemotoxicity from breast CA. EF in January 2016 40-45% . Cath normal in [place]. - NYHA I . Doing great.   Volume status stable. Continue lasix 40 mg in and and 20 mg in pm - Continue current dose of carvedilol, inspra, hydralazine, and imdur.  Check BMET today  2. Breast CA - s/p treatment 2008. Has not seen an oncologist since she moved from [place] in 2015. Wants referral to oncologist. Will discuss with Dr Haroldine Laws  3. Paroxysmal AF - Regular rhythm.  On Xarelto. No Bleeding problem.  4. H/o DVT- on xarelto . No bleeding problems.  5. Hyperlipidemia - on crestor. Followed by Dr. Criss Rosales 6. H/O VT ICD discharge x3 11/2015. Device reprogrammed and BB increased. She has follow up with EP.   Follow up in 6 months.    Claudia Babe NP-C  2:05 PM

## 2015-12-27 NOTE — Patient Instructions (Signed)
Follow up in 6 months  No changes.    Stay physically active! Staying active will give you more energy and make your muscles stronger. Start with 5 minutes at a time and work your way up to 30 minutes a day. Break up your activities--do some in the morning and some in the afternoon. Start with 3 days per week and work your way up to 5 days as you can.  If you have chest pain, feel short of breath, dizzy, or lightheaded, STOP. If you don't feel better after a short rest, call 911. If you do feel better, call the heart failure clinic to let them know you have symptoms with exercise.

## 2015-12-27 NOTE — Progress Notes (Signed)
Advanced Heart Failure Medication Review by a Pharmacist  Does the patient  feel that his/her medications are working for him/her?  yes  Has the patient been experiencing any side effects to the medications prescribed?  no  Does the patient measure his/her own blood pressure or blood glucose at home?  yes   Does the patient have any problems obtaining medications due to transportation or finances?   no  Understanding of regimen: good Understanding of indications: good Potential of compliance: good Patient understands to avoid NSAIDs. Patient understands to avoid decongestants.  Issues to address at subsequent visits: None    Pharmacist comments: 60 YO female presenting to HF clinic. She reports adherence to her regimen and no problems obtaining her meds. Patient was questioning why we have her on xarelto since her DVT was years ago.  Counseled patient that Xarelto is also used to prevent stroke with her PAF and s/sx of bleeding to watch out for.     Time with patient: 10 min  Preparation and documentation time: 5 min  Total time: 15 min

## 2015-12-29 ENCOUNTER — Other Ambulatory Visit: Payer: Self-pay | Admitting: *Deleted

## 2015-12-29 ENCOUNTER — Ambulatory Visit: Payer: Self-pay | Admitting: *Deleted

## 2015-12-29 NOTE — Patient Outreach (Signed)
Weekly transition of care call placed to member, no answer, HIPPA compliant voice message left.  Will await call back and continue weekly calls next week.  Valente David, BSN, [place] [facility]  [facility] Care Manager [phone]

## 2016-01-03 ENCOUNTER — Encounter: Payer: Self-pay | Admitting: *Deleted

## 2016-01-03 ENCOUNTER — Other Ambulatory Visit: Payer: Self-pay | Admitting: *Deleted

## 2016-01-03 NOTE — Patient Outreach (Signed)
[name]) Care Management   01/03/2016  Claudia [name] 02-16-1956 [id number]  Claudia [name] is an 60 y.o. female  Subjective:   Member reports that she is progressing, "getting better everyday."  She states her hip remains sore from her fall after her AICD discharged.  Denies chest pain or discomfort, shortness of breath.  Reports taking all medications as prescribed.   Objective:   Review of Systems  Constitutional: Negative.   HENT: Negative.   Eyes: Negative.   Respiratory: Negative.   Cardiovascular: Negative.   Gastrointestinal: Negative.   Genitourinary: Negative.   Musculoskeletal: Negative.   Skin: Negative.   Neurological: Negative.   Endo/Heme/Allergies: Negative.   Psychiatric/Behavioral: Negative.     Physical Exam  Constitutional: She is oriented to person, place, and time. She appears well-developed and well-nourished.  Neck: Normal range of motion.  Cardiovascular: Normal rate and regular rhythm.   Respiratory: Effort normal and breath sounds normal.  GI: Soft. Bowel sounds are normal.  Musculoskeletal: Normal range of motion.  Neurological: She is alert and oriented to person, place, and time.  Skin: Skin is warm and dry.    BP 118/64 mmHg  Pulse 69  Resp 18  Ht 1.676 m (5' 6" )  Wt 188 lb (85.276 kg)  BMI 30.36 kg/m2  SpO2 98%   Encounter Medications:   Outpatient Encounter Prescriptions as of 01/03/2016  Medication Sig  . acetaminophen (TYLENOL) 325 MG tablet Take 650 mg by mouth every 6 (six) hours as needed.  . carvedilol (COREG) 12.5 MG tablet Take 1.5 tablets (18.75 mg total) by mouth 2 (two) times daily with a meal.  . eplerenone (INSPRA) 25 MG tablet Take 25 mg by mouth daily.  . furosemide (LASIX) 40 MG tablet TAKE 1 TABLET IN THE MORNING AND TAKE 1/2 TABLET   IN THE EVENING  . hydrALAZINE (APRESOLINE) 25 MG tablet Take 37.5 mg by mouth 3 (three) times daily. Take 1 and 1/2 tablet daily by mouth  . isosorbide mononitrate  (IMDUR) 30 MG 24 hr tablet Take one tablet (30 mg) by mouth at night  . lisinopril (PRINIVIL,ZESTRIL) 10 MG tablet TAKE 1 TABLET EVERY DAY  . magnesium oxide (MAG-OX) 400 (241.3 Mg) MG tablet Take 1 tablet (400 mg total) by mouth daily.  . traMADol (ULTRAM) 50 MG tablet Take 1 tablet (50 mg total) by mouth every 6 (six) hours as needed for moderate pain.  Marland Kitchen XARELTO 20 MG TABS tablet TAKE 1 TABLET EVERY DAY  WITH  SUPPER   No facility-administered encounter medications on file as of 01/03/2016.    Functional Status:   In your present state of health, do you have any difficulty performing the following activities: 01/03/2016 12/16/2015  Hearing? N N  Vision? N N  Difficulty concentrating or making decisions? N N  Walking or climbing stairs? N N  Dressing or bathing? N N  Doing errands, shopping? N N  Preparing Food and eating ? N -  Using the Toilet? N -  In the past six months, have you accidently leaked urine? N -  Do you have problems with loss of bowel control? N -  Managing your Medications? N -  Managing your Finances? N -  Housekeeping or managing your Housekeeping? N -    Fall/Depression Screening:    PHQ 2/9 Scores 01/03/2016  PHQ - 2 Score 1   Fall Risk  01/03/2016  Falls in the past year? Yes  Number falls in past yr: 1  Injury with Fall? Yes  Risk Factor Category  High Fall Risk  Follow up Falls prevention discussed    Assessment:    Met with member at scheduled time.  She denies any concerns at this time.  She uses a pill box to help with medication adherence, and weighs/check blood pressure daily.  She admits that she does not record her daily readings, encouraged to document for trends and for better management of medications by physician.  Member is aware of heart failure zones and when to notify physician.  Reports being in the green zone today.  She has attended a follow up appointment with the heart failure clinic, and will have an appointment with her PCP next month.   Denies need for transportation or other community resources, and the need for pharmacy involvement.  Member provided with this care manager's contact information, encouraged to contact with questions or concerns.  Plan:   Will continue with weekly transition of care calls next week.  THN CM Care Plan Problem One        Most Recent Value   Care Plan Problem One  Recent hospitalization   Role Documenting the Problem One  Care Management Coordinator   Care Plan for Problem One  Active   THN Long Term Goal (31-90 days)  Member will not be readmitted to hospital within the next 31 days   THN Long Term Goal Start Date  12/19/15   Interventions for Problem One Long Term Goal  Discussed with member the importance of following discharge instructions, including follow up appointments, medications, diet, to decrease the risk of readmission   THN CM Short Term Goal #1 (0-30 days)  Member will have follow up appointment with PCP and/or cardilogist within the next 4 weeks   THN CM Short Term Goal #1 Start Date  12/19/15   [facility] CM Short Term Goal #1 Met Date  12/27/15   Interventions for Short Term Goal #1  Discussed with member the importance of following discharge instructions, including follow up appointments, medications, diet, to decrease the risk of readmission   THN CM Short Term Goal #2 (0-30 days)  Member will report taking all medications as prescribed over the next 4 weeks   THN CM Short Term Goal #2 Start Date  12/19/15   Interventions for Short Term Goal #2  Discussed with member the importance of following discharge instructions, including follow up appointments, medications, diet, to decrease the risk of readmission   THN CM Short Term Goal #3 (0-30 days)  Member will report taking and recording daily weights over the next 4 weeks   THN CM Short Term Goal #3 Start Date  12/19/15   Interventions for Short Tern Goal #3  Discussed with member the importance of following discharge instructions,  including follow up appointments, medications, diet, to decrease the risk of readmission. Provided with [facility] calendar tool for recording daily measurements     Valente David, BSN, [place] Manager [phone]

## 2016-01-11 ENCOUNTER — Other Ambulatory Visit: Payer: Self-pay | Admitting: *Deleted

## 2016-01-11 NOTE — Patient Outreach (Signed)
Weekly transition of care call placed to member.  She state she is feeling "really good."  She states that she has continued to increase her activity and restarted classes at the [employer].  She reports that she has obtained a medical alert button in the event she has another episode where her AICD fires and she is unable to get to a phone.  She continues to check her weight and blood pressure on a daily basis, reporting that they are stable, no change in her weight.  She denies any shortness of breath or pain/discomfort, and denies concerns at this time, will continue with weekly calls next week.  Valente David, BSN, [place] [facility]  [facility] Care Manager [phone]

## 2016-01-19 ENCOUNTER — Other Ambulatory Visit: Payer: Self-pay | Admitting: *Deleted

## 2016-01-19 ENCOUNTER — Encounter: Payer: Self-pay | Admitting: *Deleted

## 2016-01-19 NOTE — Patient Outreach (Signed)
Attempt made to complete transition of care program, no answer.  HIPPA compliant voice message left.  Will await call back.  Valente David, BSN, [place] Management  [facility] Care Manager [phone]

## 2016-01-19 NOTE — Patient Outreach (Signed)
Call received back from member, she states she is "real good.  I just finished my exercise class."  She denies any further firing of her AICD and denies any shortness of breath or chest discomfort.  She report that her weight has been consistent, denies swelling.  She is made aware that her transition of care program is complete.  She denies the need for further assistance from this care manager or ongoing education from the health coach.  She is aware that she will continue to have access to the 24 hour nurse triage line, and she is able to contact the [facility] office in the future should her needs change.  She verbalizes understanding.  Will notify PCP and care management assistant of case closure.  Valente David, BSN, [place] Management  [facility] Care Manager [phone]

## 2016-01-24 ENCOUNTER — Encounter: Payer: Self-pay | Admitting: Internal Medicine

## 2016-01-24 ENCOUNTER — Ambulatory Visit (INDEPENDENT_AMBULATORY_CARE_PROVIDER_SITE_OTHER): Payer: Commercial Managed Care - HMO | Admitting: Internal Medicine

## 2016-01-24 VITALS — BP 118/62 | HR 72 | Ht 66.0 in | Wt 191.4 lb

## 2016-01-24 DIAGNOSIS — I951 Orthostatic hypotension: Secondary | ICD-10-CM | POA: Diagnosis not present

## 2016-01-24 DIAGNOSIS — Z95810 Presence of automatic (implantable) cardiac defibrillator: Secondary | ICD-10-CM | POA: Diagnosis not present

## 2016-01-24 DIAGNOSIS — I5022 Chronic systolic (congestive) heart failure: Secondary | ICD-10-CM

## 2016-01-24 DIAGNOSIS — I429 Cardiomyopathy, unspecified: Secondary | ICD-10-CM | POA: Diagnosis not present

## 2016-01-24 DIAGNOSIS — I428 Other cardiomyopathies: Secondary | ICD-10-CM

## 2016-01-24 LAB — CUP PACEART INCLINIC DEVICE CHECK
Implantable Lead Location: 753862
Implantable Lead Model: 3010
MDC IDC LEAD IMPLANT DT: 20150819
MDC IDC SESS DTM: 20170425164614
Pulse Gen Model: 1010
Pulse Gen Serial Number: 19497

## 2016-01-24 NOTE — Patient Instructions (Signed)
Medication Instructions: 1) Increase Coreg (carvedilol) to 25 mg one tablet by mouth twice daily  Labwork: - none  Procedures/Testing: - none  Follow-Up: - Your physician recommends that you schedule a follow-up appointment in: 3 months with the device clinic  - Your physician wants you to follow-up in: 6 months with Chanetta Marshall, NP for Dr. Caryl Comes. You will receive a reminder letter in the mail two months in advance. If you don't receive a letter, please call our office to schedule the follow-up appointment.   Any Additional Special Instructions Will Be Listed Below (If Applicable).     If you need a refill on your cardiac medications before your next appointment, please call your pharmacy.

## 2016-01-24 NOTE — Progress Notes (Signed)
Patient Care Team: Iona Beard, MD as PCP - General (Family Medicine) Nicholas Lose, MD as Consulting Physician (Hematology and Oncology) Jolaine Artist, MD as Consulting Physician (Cardiology)   HPI  Claudia [name] is a 60 y.o. female seen in follow-up for ICD implanted for primary prevention for nonischemic cardiomyopathy. She had previously implanted transvenous device removed because of infection.   She suffered appropriate ICD therapy 3/17.  Interestingly, she has slow ventricular tachycardia associated with T-wave oversensing which prompted the device therapy.  Beta blockers were increased;  She is doing well on increased dose of coreg  Records and Results Reviewed   Past Medical History  Diagnosis Date  . Breast cancer ([place])   . CHF (congestive heart failure) ([place])   . AICD (automatic cardioverter/defibrillator) present 09/15/2014  . Implantable cardioverter-defibrillator infection  s/p extraction 10/07/2014    Past Surgical History  Procedure Laterality Date  . Masectomy    . Cholecystectomy    . Fiber tumor    . Device removal      defib    Current Outpatient Prescriptions  Medication Sig Dispense Refill  . acetaminophen (TYLENOL) 325 MG tablet Take 650 mg by mouth every 6 (six) hours as needed.    . carvedilol (COREG) 12.5 MG tablet Take 1.5 tablets (18.75 mg total) by mouth 2 (two) times daily with a meal. 45 tablet 6  . eplerenone (INSPRA) 25 MG tablet Take 25 mg by mouth daily.    . furosemide (LASIX) 40 MG tablet TAKE 1 TABLET IN THE MORNING AND TAKE 1/2 TABLET   IN THE EVENING 135 tablet 3  . hydrALAZINE (APRESOLINE) 25 MG tablet Take 37.5 mg by mouth 3 (three) times daily. Take 1 and 1/2 tablet daily by mouth    . isosorbide mononitrate (IMDUR) 30 MG 24 hr tablet Take one tablet (30 mg) by mouth at night 90 tablet 3  . lisinopril (PRINIVIL,ZESTRIL) 10 MG tablet TAKE 1 TABLET EVERY DAY 90 tablet 3  . magnesium oxide (MAG-OX) 400 (241.3 Mg) MG tablet  Take 1 tablet (400 mg total) by mouth daily. 30 tablet 6  . traMADol (ULTRAM) 50 MG tablet Take 1 tablet (50 mg total) by mouth every 6 (six) hours as needed for moderate pain. 30 tablet 0  . XARELTO 20 MG TABS tablet TAKE 1 TABLET EVERY DAY  WITH  SUPPER 90 tablet 3   No current facility-administered medications for this visit.    No Known Allergies    Review of Systems negative except from HPI and PMH  Physical Exam BP 118/62 mmHg  Pulse 72  Ht 5\' 6"  (1.676 m)  Wt 191 lb 6.4 oz (86.818 kg)  BMI 30.91 kg/m2  SpO2 98% Well developed and well nourished in no acute distress HENT normal E scleral and icterus clear Neck Supple JVP flat; carotids brisk and full Clear to ausculation  Regular rate and rhythm, 2/6 m Soft with active bowel sounds No clubbing cyanosis  Edema Alert and oriented, grossly normal motor and sensory function Skin Warm and Dry  ECG from 316 demonstrates sinus rhythm with probable limb lead reversal  Assessment and  Plan Nonischemic cardiomyopathy  S ICD  Ventricular tachycardia-slow with T-wave over sensing resulting in inappropriate therapy but appropriately delivered  Atrial fibrillation-paroxysmal  DVT   Patient is doing well currently  Euvolemic continue current meds  No intercurrent Ventricular tachycardia  No intercurrent atrial fibrillation or flutter  Will increase carvedilol from A. fib 0.75--25  mg. We will see her in 6 months

## 2016-02-14 DIAGNOSIS — N183 Chronic kidney disease, stage 3 (moderate): Secondary | ICD-10-CM | POA: Diagnosis not present

## 2016-02-14 DIAGNOSIS — Z95810 Presence of automatic (implantable) cardiac defibrillator: Secondary | ICD-10-CM | POA: Diagnosis not present

## 2016-02-14 DIAGNOSIS — I5042 Chronic combined systolic (congestive) and diastolic (congestive) heart failure: Secondary | ICD-10-CM | POA: Diagnosis not present

## 2016-05-03 ENCOUNTER — Ambulatory Visit (INDEPENDENT_AMBULATORY_CARE_PROVIDER_SITE_OTHER): Payer: Commercial Managed Care - HMO | Admitting: *Deleted

## 2016-05-03 ENCOUNTER — Encounter: Payer: Self-pay | Admitting: Internal Medicine

## 2016-05-03 DIAGNOSIS — Z95810 Presence of automatic (implantable) cardiac defibrillator: Secondary | ICD-10-CM

## 2016-05-03 NOTE — Progress Notes (Signed)
Subcutaneous ICD check in clinic. 0 untreated episodes; 0 treated episodes; 0 shocks delivered. Electrode impedance status okay. No programming changes. Remaining longevity to ERI 68%.

## 2016-05-16 ENCOUNTER — Encounter: Payer: Self-pay | Admitting: Nurse Practitioner

## 2016-07-02 IMAGING — CT CT PELVIS W/O CM
2 of 6 series · 15 of 46 positions shown, 19 images · non-contrast
Comparison: None.

CLINICAL DATA: Fall, landing on right buttock region.  Soreness.

EXAM:
CT PELVIS WITHOUT CONTRAST
TECHNIQUE: Multidetector CT imaging of the pelvis was performed following the
standard protocol without intravenous contrast.

[Series 3: pelvis 5.0 i31f 1 · axial · 0.61mm/px · z∈[+1071,+1271]mm · 12 of 46 slices shown, 16 images]
[im 4/46  soft-tissue]
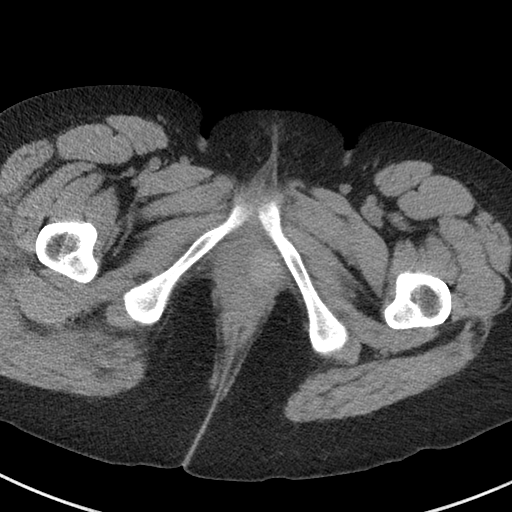
[im 4/46  bone]
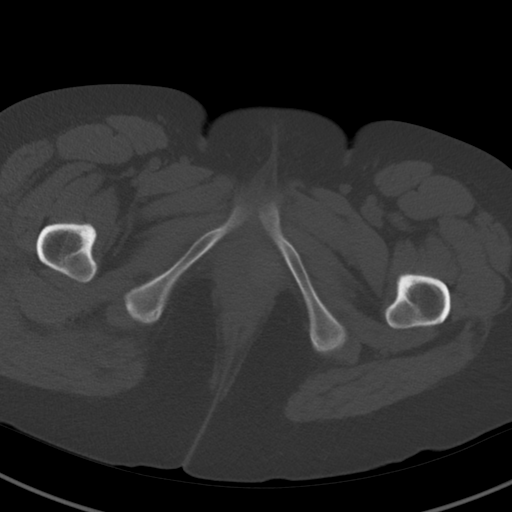
[im 7/46  soft-tissue]
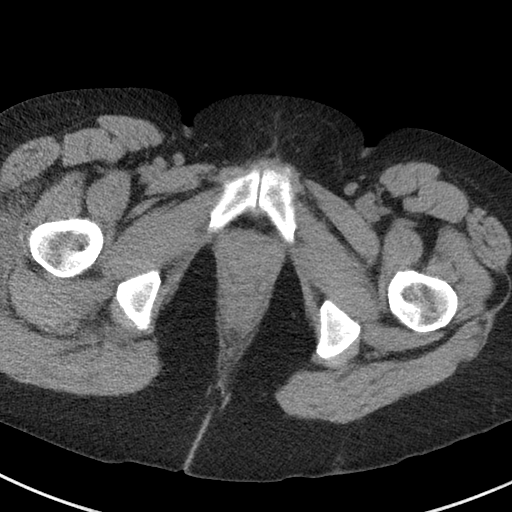
[im 13/46  soft-tissue]
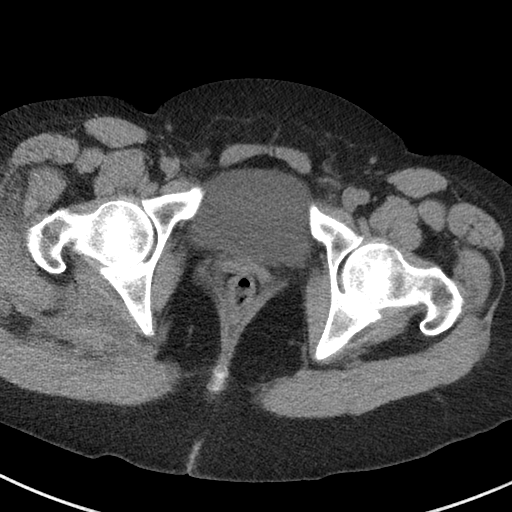
[im 16/46  soft-tissue]
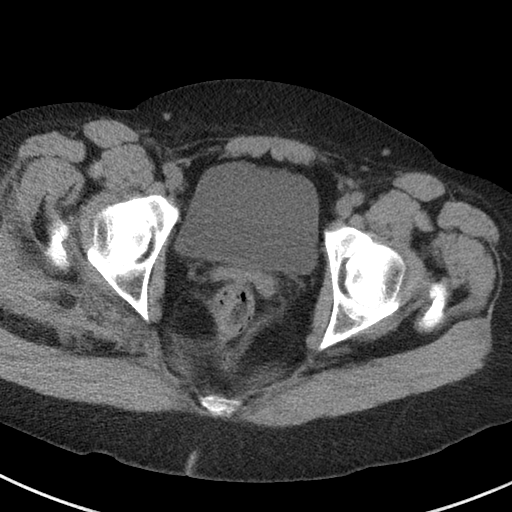
[im 21/46  soft-tissue]
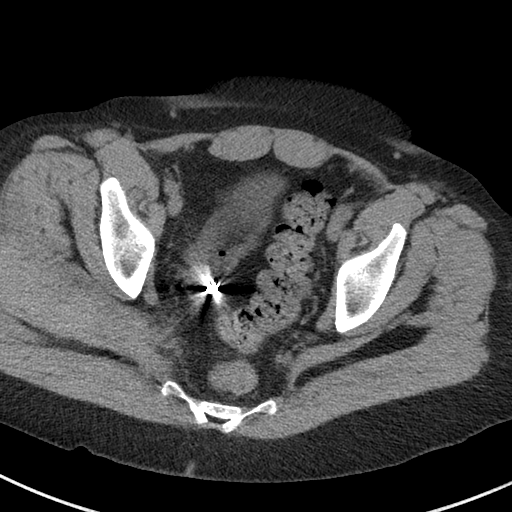
[im 25/46  soft-tissue]
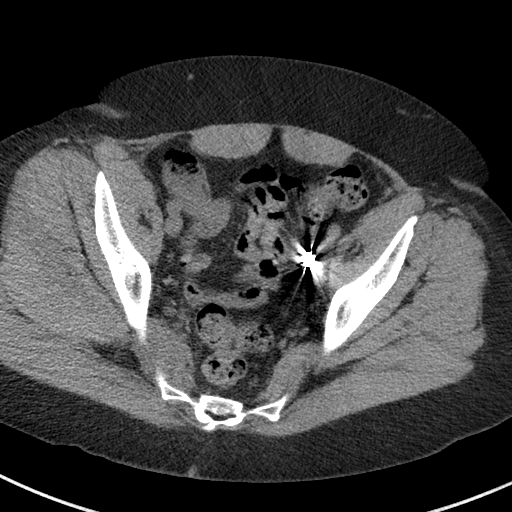
[im 30/46  soft-tissue]
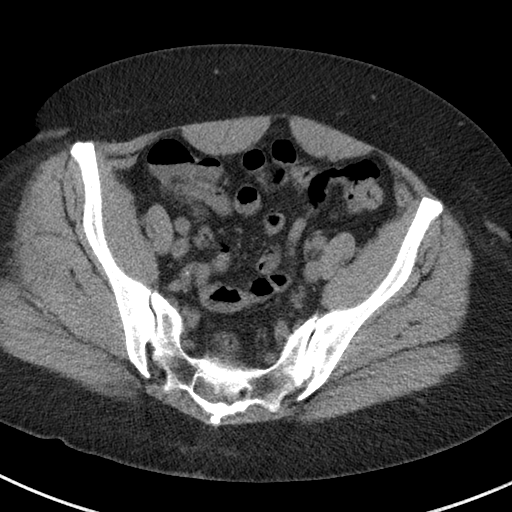
[im 33/46  soft-tissue]
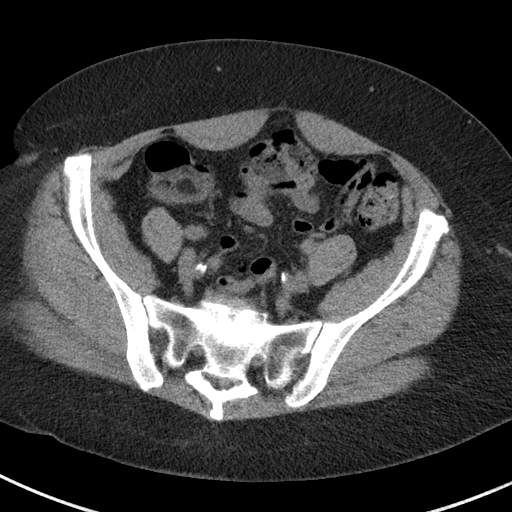
[im 39/46  soft-tissue]
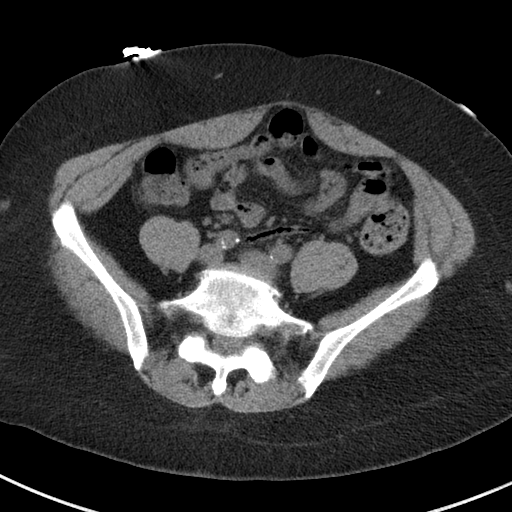
[im 39/46  lung]
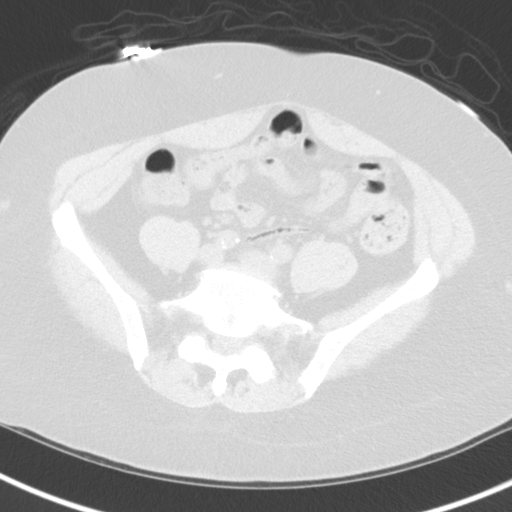
[im 39/46  bone]
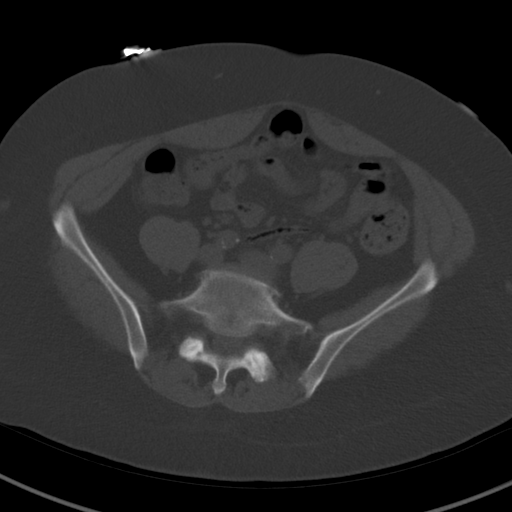
[im 40/46  lung]
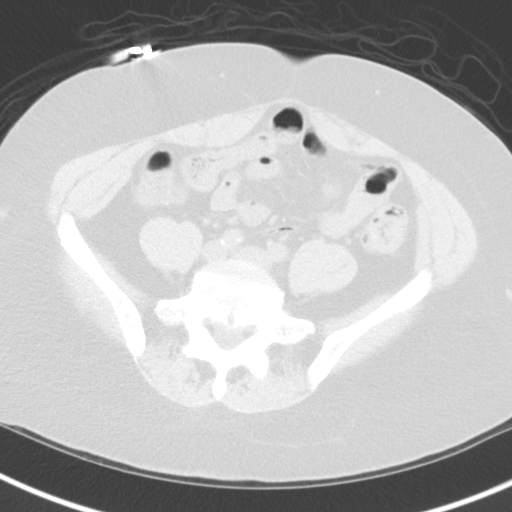
[im 42/46  soft-tissue]
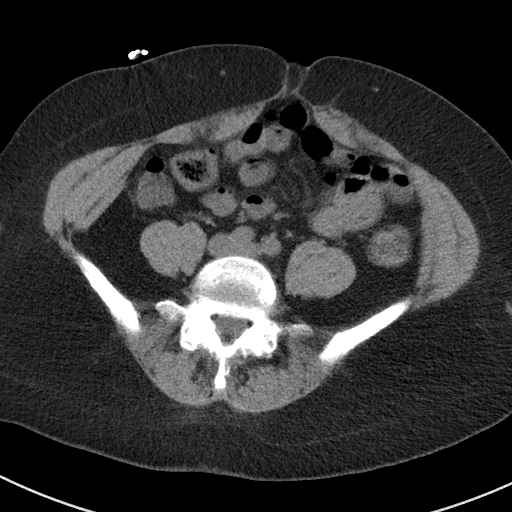
[im 42/46  lung]
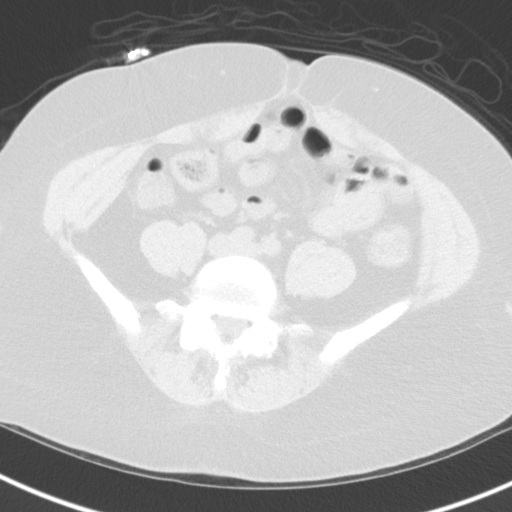
[im 44/46  lung]
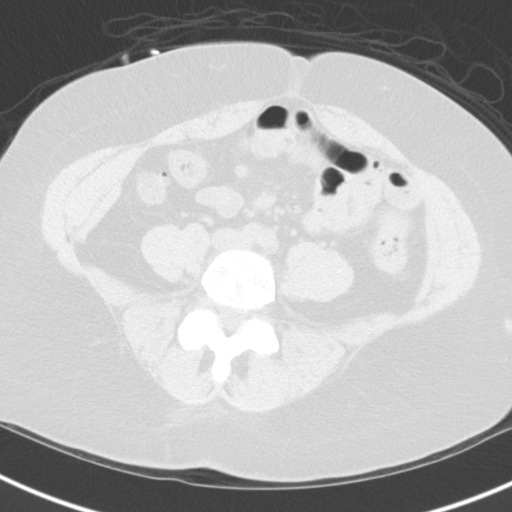

[Series 6: coronal · coronal · 0.44mm/px · 3 of 81 slices shown]
[im 17/81  soft-tissue]
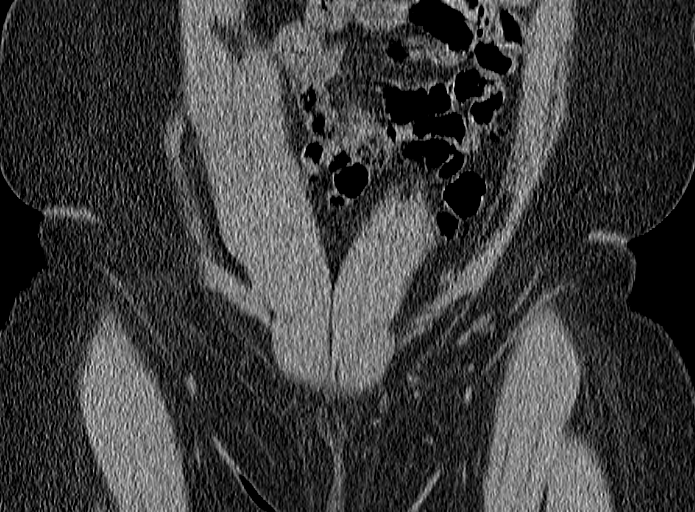
[im 33/81  soft-tissue]
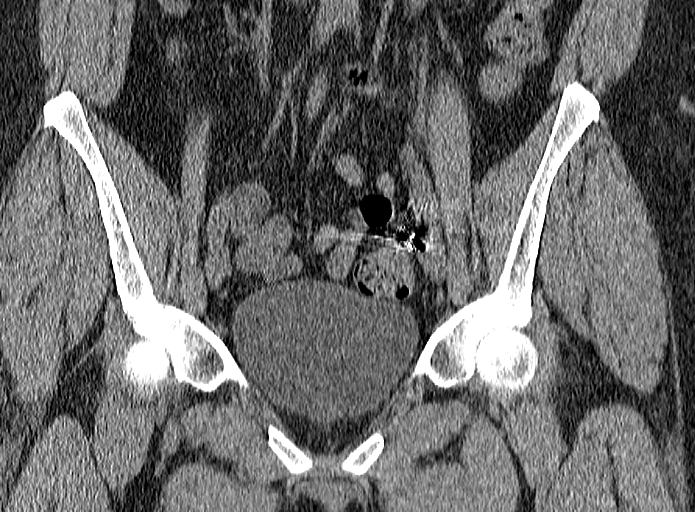
[im 49/81  soft-tissue]
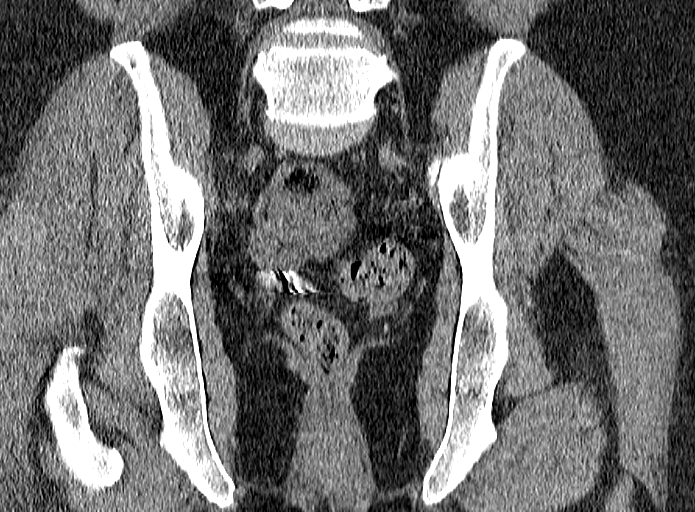

[15 of 46 positions shown; findings below may reference images not displayed]

FINDINGS: No acute bony abnormality. Joint spaces and SI joints are symmetric
and unremarkable. No fracture.

Visualized large and small bowel are unremarkable. Appendix is
normal. No free fluid, free air or adenopathy.
IMPRESSION: No acute bony abnormality.

## 2016-07-05 ENCOUNTER — Telehealth: Payer: Self-pay | Admitting: Internal Medicine

## 2016-07-05 NOTE — Telephone Encounter (Signed)
New message     *STAT* If patient is at the pharmacy, call can be transferred to refill team.   1. Which medications need to be refilled? (please list name of each medication and dose if known) lisinopril 2 mg  2. Which pharmacy/location (including street and city if local pharmacy) is medication to be sent to? [facility]  3. Do they need a 30 day or 90 day supply? [place]

## 2016-07-05 NOTE — Telephone Encounter (Signed)
Called pt to inform her that I called [facility] and they will be sending her a refill of lisinopril and if she has any other problems, questions or concerns to call the office. Pt verbalized understanding.

## 2016-07-24 ENCOUNTER — Other Ambulatory Visit (HOSPITAL_COMMUNITY): Payer: Self-pay | Admitting: Internal Medicine

## 2016-07-25 ENCOUNTER — Encounter: Payer: Commercial Managed Care - HMO | Admitting: Nurse Practitioner

## 2016-07-31 NOTE — Progress Notes (Signed)
Cardiology Office Note Date:  08/01/2016  Patient ID:  Claudia [name], Claudia [name] May 29, 1956, MRN [id number] PCP:  Claudia Font, MD  Cardiologist:  Dr. Haroldine Laws Electrophysiologist: Dr. Caryl Comes   Chief Complaint:  Device check visit  History of Present Illness: Claudia [name] is a 60 y.o. female with history of NICM, chronic CHF (systolic), breast cancer threated with chemi resulting in her CM, PAFib and DVT hx.  Records note: She developed breast cancer in 2008 and underwent chemotherapy and in the wake of this, she developed congestive heart failure with EF 20%. Cardiac cath with normal coronaries. She received an ICD It became infected in the spring of 2015 with staph and she underwent extraction. She has undergone S ICD implantation following prolonged use of a LifeVest. She was ultimately transferred to [facility] in [place] in 01/2014 for consideration of heart transplantation. They diuresed her over 60 pounds and she improved markedly, and here f/u with CHF team.  She comes in today to be seen for Dr. Caryl Comes, last seen by him in April doing well and her coreg up-titrated.  She is feeling very well without complaints.  She denies any kind of CP, palpitations or SOB, no dizziness, near syncope or syncope.  She exercises daily without exertional intolerances.  Her weight is up, though she denies any symptoms of fluid OL and thinks may be some muscle mass with her workouts.  She denies any bleeding or signs of bleeding.   DEVICE history: BSCi single chamber S-ICD, implanted 05/19/14, Dr. Caryl Comes, Device was implanted following removal of a transvenous device secondary to infection Shockedx3 in March 2017 for slow VT with undersensing to start, and oversensing resulting in shock therapy as otherwise would have been below the detection zone, changed detection to 250 so As to decrease the likelihood that her VT and overcounting    Past Medical History:  Diagnosis Date  . AICD (automatic cardioverter/defibrillator)  present 09/15/2014  . Breast cancer ([place])   . CHF (congestive heart failure) ([place])   . 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 CHOLECYSTECTOMY    . device removal     defib  . fiber tumor    . masectomy      Current Outpatient Prescriptions  Medication Sig Dispense Refill  . acetaminophen (TYLENOL) 325 MG tablet Take 650 mg by mouth every 6 (six) hours as needed.    . carvedilol (COREG) 25 MG tablet Take 1 tablet (25 mg total) by mouth 2 (two) times daily.    Marland Kitchen eplerenone (INSPRA) 25 MG tablet Take 25 mg by mouth daily.    . furosemide (LASIX) 40 MG tablet TAKE 1 TABLET IN THE MORNING AND TAKE 1/2 TABLET   IN THE EVENING 135 tablet 3  . hydrALAZINE (APRESOLINE) 25 MG tablet Take 37.5 mg by mouth 3 (three) times daily. Take 1 and 1/2 tablet daily by mouth    . isosorbide mononitrate (IMDUR) 30 MG 24 hr tablet Take one tablet (30 mg) by mouth at night 90 tablet 3  . lisinopril (PRINIVIL,ZESTRIL) 10 MG tablet TAKE 1 TABLET EVERY DAY 90 tablet 3  . magnesium oxide (MAG-OX) 400 (241.3 Mg) MG tablet Take 1 tablet (400 mg total) by mouth daily. 30 tablet 6  . XARELTO 20 MG TABS tablet TAKE 1 TABLET EVERY DAY  WITH  SUPPER 90 tablet 3   No current facility-administered medications for this visit.  Allergies:   Review of patient's allergies indicates no known allergies.   Social History:  The patient  reports that she has quit smoking. She has never used smokeless tobacco. She reports that she does not drink alcohol or use drugs.   Family History:  The patient's family history includes Cancer in her father; Diabetes in her mother.  ROS:  Please see the history of present illness. All other systems are reviewed and otherwise negative.   PHYSICAL EXAM:  VS:  BP 118/74   Pulse 60   Ht 5\' 6"  (1.676 m)   Wt 199 lb (90.3 kg)   BMI 32.12 kg/m  BMI: Body mass index is 32.12 kg/m. Well nourished,  well developed, in no acute distress  HEENT: normocephalic, atraumatic  Neck: no JVD, carotid bruits or masses Cardiac:  RRR; no significant murmurs, no rubs, or gallops Lungs:  clear to auscultation bilaterally, no wheezing, rhonchi or rales  Abd: soft, nontender MS: no deformity or atrophy Ext: no edema  Skin: warm and dry, no rash Neuro:  No gross deficits appreciated Psych: euthymic mood, full affect  S-ICD site is stable, no tethering or discomfort   EKG:  Done 12/15/15 and reviewed by myself today showed SR, T changes ant/lat ICD interrogation today and reviewed by myself: *stable function, no new events  ECHO 11/2015: EF 45% Grade I DD ECHO 10/07/14 and EF 40-45% with normal RV.  Recent Labs: 12/15/2015: Hemoglobin 11.1; Platelets 256 12/16/2015: B Natriuretic Peptide 516.3; BUN 23; Creatinine, Ser 1.26; Magnesium 2.1; Potassium 4.2; Sodium 137; TSH 4.853  No results found for requested labs within last 8760 hours.   CrCl cannot be calculated (Patient's most recent lab result is older than the maximum 21 days allowed.).   Wt Readings from Last 3 Encounters:  08/01/16 199 lb (90.3 kg)  01/24/16 191 lb 6.4 oz (86.8 kg)  01/11/16 187 lb (84.8 kg)     Other studies reviewed: Additional studies/records reviewed today include: summarized above  ASSESSMENT AND PLAN:  1. NICM w/ICD     normal device function, no changes  2. Chronic CHF (systolic)     Last echo with EF 45%     exam is euvolemnic     C/w CHF team  3. PAFib (and hx of DVT)     CHA2DS2Vasc is at least 2, on Xarelto   Disposition: F/u with ICD check in clinic 3 months, Dr. Caryl Comes in 6 months, sooner if needed.  She reports labs/visit with her PMD is coming upin the next couple weeks, will defer labs to that visit.  Current medicines are reviewed at length with the patient today.  The patient did not have any concerns regarding medicines.  Haywood Lasso, PA-C 08/01/2016 2:00 PM     [address] [place] [id number] [phone] (office)  [phone] (fax)

## 2016-08-01 ENCOUNTER — Encounter: Payer: Self-pay | Admitting: Physician Assistant

## 2016-08-01 ENCOUNTER — Ambulatory Visit (INDEPENDENT_AMBULATORY_CARE_PROVIDER_SITE_OTHER): Payer: Commercial Managed Care - HMO | Admitting: Physician Assistant

## 2016-08-01 ENCOUNTER — Encounter (INDEPENDENT_AMBULATORY_CARE_PROVIDER_SITE_OTHER): Payer: Self-pay

## 2016-08-01 VITALS — BP 118/74 | HR 60 | Ht 66.0 in | Wt 199.0 lb

## 2016-08-01 DIAGNOSIS — I420 Dilated cardiomyopathy: Secondary | ICD-10-CM

## 2016-08-01 DIAGNOSIS — I5022 Chronic systolic (congestive) heart failure: Secondary | ICD-10-CM | POA: Diagnosis not present

## 2016-08-01 DIAGNOSIS — I480 Paroxysmal atrial fibrillation: Secondary | ICD-10-CM | POA: Diagnosis not present

## 2016-08-01 NOTE — Patient Instructions (Signed)
Medication Instructions:   Your physician recommends that you continue on your current medications as directed. Please refer to the Current Medication list given to you today.    If you need a refill on your cardiac medications before your next appointment, please call your pharmacy.  Labwork: NONE ORDERED  TODAY    Testing/Procedures: NONE ORDERED  TODAY    Follow-Up:  3 MONTHS WITH DEVICE CLINIC FOR DEVICE CHECK   Your physician wants you to follow-up in:  IN  [place].Marland KitchenYou will receive a reminder letter in the mail two months in advance. If you don't receive a letter, please call our office to schedule the follow-up appointment.      Any Other Special Instructions Will Be Listed Below (If Applicable).

## 2016-08-03 ENCOUNTER — Encounter: Payer: Commercial Managed Care - HMO | Admitting: Physician Assistant

## 2016-08-15 DIAGNOSIS — S6391XA Sprain of unspecified part of right wrist and hand, initial encounter: Secondary | ICD-10-CM | POA: Diagnosis not present

## 2016-08-15 DIAGNOSIS — I480 Paroxysmal atrial fibrillation: Secondary | ICD-10-CM | POA: Diagnosis not present

## 2016-08-15 DIAGNOSIS — I5042 Chronic combined systolic (congestive) and diastolic (congestive) heart failure: Secondary | ICD-10-CM | POA: Diagnosis not present

## 2016-08-15 DIAGNOSIS — N183 Chronic kidney disease, stage 3 (moderate): Secondary | ICD-10-CM | POA: Diagnosis not present

## 2016-08-22 DIAGNOSIS — A483 Toxic shock syndrome: Secondary | ICD-10-CM | POA: Diagnosis not present

## 2016-08-22 DIAGNOSIS — I480 Paroxysmal atrial fibrillation: Secondary | ICD-10-CM | POA: Diagnosis not present

## 2016-08-22 DIAGNOSIS — I5042 Chronic combined systolic (congestive) and diastolic (congestive) heart failure: Secondary | ICD-10-CM | POA: Diagnosis not present

## 2016-09-09 NOTE — Assessment & Plan Note (Signed)
left breast invasive ductal carcinoma with DCIS intermediate to high-grade diagnosed February 2008, ER 0%, PR 0%, HER-2 positive ratio 5.3, Ki-67 54%, inflammatory breast cancer with Peau de Orange, stage IIIB clinical stage.  Status post neoadjuvant chemotherapy with AC 4 followed by mastectomy 02/25/2007, T1b N1 (stage IB pathologic stage); followed by Taxol-Herceptin4 (stopped early due to cardiomyopathy); adjuvant radiation completed 08/08/2007  Breast Cancer Surveillance: 1. Breast exam 09/10/2016 : Normal 2. Mammogram 09/07/2015 No abnormalities right breast.  Survivorship: Discussed the importance of physical exercise in decreasing the likelihood of breast cancer recurrence. Recommended 30 mins daily 6 days a week of either brisk walking or cycling or swimming. Encouraged patient to eat more fruits and vegetables and decrease red meat.   Return to clinic once a year for follow-up

## 2016-09-10 ENCOUNTER — Ambulatory Visit (HOSPITAL_BASED_OUTPATIENT_CLINIC_OR_DEPARTMENT_OTHER): Payer: Commercial Managed Care - HMO | Admitting: Hematology and Oncology

## 2016-09-10 ENCOUNTER — Encounter: Payer: Self-pay | Admitting: Hematology and Oncology

## 2016-09-10 DIAGNOSIS — C50412 Malignant neoplasm of upper-outer quadrant of left female breast: Secondary | ICD-10-CM

## 2016-09-10 DIAGNOSIS — Z853 Personal history of malignant neoplasm of breast: Secondary | ICD-10-CM

## 2016-09-10 DIAGNOSIS — I509 Heart failure, unspecified: Secondary | ICD-10-CM

## 2016-09-10 DIAGNOSIS — Z170 Estrogen receptor positive status [ER+]: Principal | ICD-10-CM

## 2016-09-10 NOTE — Progress Notes (Signed)
Patient Care Team: Iona Beard, MD as PCP - General (Family Medicine) Nicholas Lose, MD as Consulting Physician (Hematology and Oncology) Jolaine Artist, MD as Consulting Physician (Cardiology)  DIAGNOSIS:  Encounter Diagnosis  Name Primary?  . Malignant neoplasm of upper-outer quadrant of left breast in female, estrogen receptor positive ([place])     SUMMARY OF ONCOLOGIC HISTORY:   Breast cancer of upper-outer quadrant of left female breast ([place])   11/08/2006 Initial Diagnosis    left breast invasive ductal carcinoma with DCIS intermediate to high-grade, ER 0%, PR 0%, HER-2 positive ratio 5.3, Ki-67 54%, inflammatory breast cancer with Peau de Orange, stage IIIB      12/03/2006 - 02/05/2007 Neo-Adjuvant Chemotherapy    neoadjuvant chemotherapy with AC X 4 (the entire treatment was given in [facility] by Dr. Jama Flavors      02/25/2007 Surgery    left mastectomy: IDC with DCIS 1 cm, lymphovascular invasion present, T1b N1 mic stage IB, 3/8 lymph nodes microscopic tumor less than 0.2 mm      04/02/2007 - 06/04/2007 Chemotherapy    Taxol 4 with Herceptin stopped early because of decreased ejection fraction to 15% in September 2008      06/24/2007 - 08/08/2007 Radiation Therapy    adjuvant radiation therapy treating supraclavicular and axillary areas and chest wall       Procedure    chronic heart failure under the care of Dr. Haroldine Laws, AICD ( at one point patient was evaluated for heart transplant in [facility])       CHIEF COMPLIANT: Surveillance of breast cancer  INTERVAL HISTORY: Claudia [name] is a 60 year old with above-mentioned history of left breast cancer treated with mastectomy followed by adjuvant chemotherapy and radiation. She had congestive heart failure and has been under the care of cardiology. She was informed that her heart has improved and that she does not need a heart transplant anymore. She denies any new lumps or nodules. She feels great. She denies any  health concerns.  REVIEW OF SYSTEMS:   Constitutional: Denies fevers, chills or abnormal weight loss Eyes: Denies blurriness of vision Ears, nose, mouth, throat, and face: Denies mucositis or sore throat Respiratory: Denies cough, dyspnea or wheezes Cardiovascular: Denies palpitation, chest discomfort Gastrointestinal:  Denies nausea, heartburn or change in bowel habits Skin: Denies abnormal skin rashes Lymphatics: Denies new lymphadenopathy or easy bruising Neurological:Denies numbness, tingling or new weaknesses Behavioral/Psych: Mood is stable, no new changes  Extremities: No lower extremity edema Breast:  denies any pain or lumps or nodules in either breasts All other systems were reviewed with the patient and are negative.  I have reviewed the past medical history, past surgical history, social history and family history with the patient and they are unchanged from previous note.  ALLERGIES:  has No Known Allergies.  MEDICATIONS:  Current Outpatient Prescriptions  Medication Sig Dispense Refill  . acetaminophen (TYLENOL) 325 MG tablet Take 650 mg by mouth every 6 (six) hours as needed.    . carvedilol (COREG) 25 MG tablet Take 1 tablet (25 mg total) by mouth 2 (two) times daily.    Marland Kitchen eplerenone (INSPRA) 25 MG tablet Take 25 mg by mouth daily.    . furosemide (LASIX) 40 MG tablet TAKE 1 TABLET IN THE MORNING AND TAKE 1/2 TABLET   IN THE EVENING 135 tablet 3  . hydrALAZINE (APRESOLINE) 25 MG tablet Take 37.5 mg by mouth 3 (three) times daily. Take 1 and 1/2 tablet daily by mouth    .  isosorbide mononitrate (IMDUR) 30 MG 24 hr tablet Take one tablet (30 mg) by mouth at night 90 tablet 3  . lisinopril (PRINIVIL,ZESTRIL) 10 MG tablet TAKE 1 TABLET EVERY DAY 90 tablet 3  . magnesium oxide (MAG-OX) 400 (241.3 Mg) MG tablet Take 1 tablet (400 mg total) by mouth daily. 30 tablet 6  . XARELTO 20 MG TABS tablet TAKE 1 TABLET EVERY DAY  WITH  SUPPER 90 tablet 3   No current  facility-administered medications for this visit.     PHYSICAL EXAMINATION: ECOG PERFORMANCE STATUS: 0 - Asymptomatic  Vitals:   09/10/16 1504  BP: 136/73  Pulse: 71  Resp: 16  Temp: 98.1 F (36.7 C)   Filed Weights   09/10/16 1504  Weight: 198 lb 4.8 oz (89.9 kg)    GENERAL:alert, no distress and comfortable SKIN: skin color, texture, turgor are normal, no rashes or significant lesions EYES: normal, Conjunctiva are pink and non-injected, sclera clear OROPHARYNX:no exudate, no erythema and lips, buccal mucosa, and tongue normal  NECK: supple, thyroid normal size, non-tender, without nodularity LYMPH:  no palpable lymphadenopathy in the cervical, axillary or inguinal LUNGS: clear to auscultation and percussion with normal breathing effort HEART: regular rate & rhythm and no murmurs and no lower extremity edema ABDOMEN:abdomen soft, non-tender and normal bowel sounds MUSCULOSKELETAL:no cyanosis of digits and no clubbing  NEURO: alert & oriented x 3 with fluent speech, no focal motor/sensory deficits EXTREMITIES: No lower extremity edema BREAST: No palpable lumps or nodules in the right breast or axilla. No palpable lumps in the left chest wall or axilla.. (exam performed in the presence of a chaperone)  LABORATORY DATA:  I have reviewed the data as listed   Chemistry      Component Value Date/Time   NA 137 12/16/2015 0420   K 4.2 12/16/2015 0420   CL 105 12/16/2015 0420   CO2 24 12/16/2015 0420   BUN 23 (H) 12/16/2015 0420   CREATININE 1.26 (H) 12/16/2015 0420      Component Value Date/Time   CALCIUM 9.2 12/16/2015 0420      Lab Results  Component Value Date   WBC 5.0 12/15/2015   HGB 11.1 (L) 12/15/2015   HCT 34.5 (L) 12/15/2015   MCV 82.5 12/15/2015   PLT 256 12/15/2015   NEUTROABS 2.9 12/15/2015    ASSESSMENT & PLAN:  Breast cancer of upper-outer quadrant of left female breast ([place]) left breast invasive ductal carcinoma with DCIS intermediate to  high-grade diagnosed February 2008, ER 0%, PR 0%, HER-2 positive ratio 5.3, Ki-67 54%, inflammatory breast cancer with Peau de Orange, stage IIIB clinical stage.  Status post neoadjuvant chemotherapy with AC 4 followed by mastectomy 02/25/2007, T1b N1 (stage IB pathologic stage); followed by Taxol-Herceptin4 (stopped early due to cardiomyopathy); adjuvant radiation completed 08/08/2007  Breast Cancer Surveillance: 1. Breast exam 09/10/2016 : Normal 2. Mammogram 09/07/2015 No abnormalities right breast.  Survivorship: Discussed the importance of physical exercise in decreasing the likelihood of breast cancer recurrence. Recommended 30 mins daily 6 days a week of either brisk walking or cycling or swimming. Encouraged patient to eat more fruits and vegetables and decrease red meat.   Return to clinic once a year for follow-up with survivorship NP.  Orders Placed This Encounter  Procedures  . Amb Referral to Survivorship Long term    Referral Priority:   Routine    Referral Type:   Consultation    Number of Visits Requested:   1   The patient has  a good understanding of the overall plan. she agrees with it. she will call with any problems that may develop before the next visit here.   Rulon Eisenmenger, MD 09/10/16

## 2016-09-28 ENCOUNTER — Other Ambulatory Visit (HOSPITAL_COMMUNITY): Payer: Self-pay | Admitting: Internal Medicine

## 2016-10-20 ENCOUNTER — Other Ambulatory Visit (HOSPITAL_COMMUNITY): Payer: Self-pay | Admitting: Adult Health

## 2016-11-01 ENCOUNTER — Other Ambulatory Visit (HOSPITAL_COMMUNITY): Payer: Self-pay | Admitting: Internal Medicine

## 2016-11-01 ENCOUNTER — Encounter (INDEPENDENT_AMBULATORY_CARE_PROVIDER_SITE_OTHER): Payer: Self-pay

## 2016-11-01 ENCOUNTER — Ambulatory Visit (INDEPENDENT_AMBULATORY_CARE_PROVIDER_SITE_OTHER): Payer: Medicare HMO | Admitting: *Deleted

## 2016-11-01 DIAGNOSIS — Z95810 Presence of automatic (implantable) cardiac defibrillator: Secondary | ICD-10-CM | POA: Diagnosis not present

## 2016-11-01 DIAGNOSIS — I472 Ventricular tachycardia: Secondary | ICD-10-CM

## 2016-11-01 DIAGNOSIS — I4729 Other ventricular tachycardia: Secondary | ICD-10-CM

## 2016-11-01 DIAGNOSIS — I5022 Chronic systolic (congestive) heart failure: Secondary | ICD-10-CM | POA: Diagnosis not present

## 2016-11-01 LAB — CUP PACEART INCLINIC DEVICE CHECK
Implantable Lead Implant Date: 20150819
Implantable Lead Location: 753862
Implantable Lead Model: 3010
Implantable Pulse Generator Implant Date: 20150819
MDC IDC PG SERIAL: 19497
MDC IDC SESS DTM: 20180201155248
Pulse Gen Model: 1010

## 2016-11-01 NOTE — Progress Notes (Signed)
Subcutaneous ICD check in clinic. 0 untreated episodes; 1 treated episode; 1 shock delivered 09/18/16. Reviewed with SK- similar to episodes March 2017- slow VT treated due to [facility]. Patient reports that she may have felt something but is unsure. Changed sensing configuration to secondary from primary. Alternate vector not an option. Electrode impedance status okay. Remaining longevity to ERI 58%. ROV with SK 02/12/17.

## 2016-11-28 DIAGNOSIS — Z853 Personal history of malignant neoplasm of breast: Secondary | ICD-10-CM | POA: Diagnosis not present

## 2016-11-28 DIAGNOSIS — Z1231 Encounter for screening mammogram for malignant neoplasm of breast: Secondary | ICD-10-CM | POA: Diagnosis not present

## 2016-12-10 ENCOUNTER — Other Ambulatory Visit (HOSPITAL_COMMUNITY): Payer: Self-pay | Admitting: Cardiology

## 2016-12-10 MED ORDER — ISOSORBIDE MONONITRATE ER 30 MG PO TB24: ORAL_TABLET | ORAL | 3 refills | Status: DC

## 2016-12-25 DIAGNOSIS — N183 Chronic kidney disease, stage 3 (moderate): Secondary | ICD-10-CM | POA: Diagnosis not present

## 2016-12-25 DIAGNOSIS — I5042 Chronic combined systolic (congestive) and diastolic (congestive) heart failure: Secondary | ICD-10-CM | POA: Diagnosis not present

## 2016-12-25 DIAGNOSIS — I480 Paroxysmal atrial fibrillation: Secondary | ICD-10-CM | POA: Diagnosis not present

## 2017-01-04 ENCOUNTER — Other Ambulatory Visit (HOSPITAL_COMMUNITY): Payer: Self-pay | Admitting: Internal Medicine

## 2017-01-22 ENCOUNTER — Encounter: Payer: Self-pay | Admitting: Internal Medicine

## 2017-01-29 ENCOUNTER — Other Ambulatory Visit (HOSPITAL_COMMUNITY): Payer: Self-pay | Admitting: Internal Medicine

## 2017-02-12 ENCOUNTER — Encounter: Payer: Medicare HMO | Admitting: Internal Medicine

## 2017-04-19 ENCOUNTER — Other Ambulatory Visit (HOSPITAL_COMMUNITY): Payer: Self-pay | Admitting: Internal Medicine

## 2017-04-25 ENCOUNTER — Encounter: Payer: Self-pay | Admitting: Internal Medicine

## 2017-04-25 ENCOUNTER — Ambulatory Visit (INDEPENDENT_AMBULATORY_CARE_PROVIDER_SITE_OTHER): Payer: Medicare HMO | Admitting: Internal Medicine

## 2017-04-25 VITALS — BP 122/72 | HR 68 | Ht 66.0 in | Wt 201.0 lb

## 2017-04-25 DIAGNOSIS — I472 Ventricular tachycardia, unspecified: Secondary | ICD-10-CM

## 2017-04-25 DIAGNOSIS — I480 Paroxysmal atrial fibrillation: Secondary | ICD-10-CM | POA: Diagnosis not present

## 2017-04-25 DIAGNOSIS — Z95810 Presence of automatic (implantable) cardiac defibrillator: Secondary | ICD-10-CM | POA: Diagnosis not present

## 2017-04-25 DIAGNOSIS — I428 Other cardiomyopathies: Secondary | ICD-10-CM | POA: Diagnosis not present

## 2017-04-25 DIAGNOSIS — I5022 Chronic systolic (congestive) heart failure: Secondary | ICD-10-CM

## 2017-04-25 NOTE — Patient Instructions (Signed)
Medication Instructions:  Your physician recommends that you continue on your current medications as directed. Please refer to the Current Medication list given to you today.  --- If you need a refill on your cardiac medications before your next appointment, please call your pharmacy. ---  Labwork: None ordered  Testing/Procedures: None ordered  Follow-Up: Your physician recommends that you schedule a follow-up appointment in: 3 months with device clinic.  Your physician wants you to follow-up in: 1 year with Dr. Caryl Comes.  You will receive a reminder letter in the mail two months in advance. If you don't receive a letter, please call our office to schedule the follow-up appointment.   Any Other Special Instructions Will Be Listed Below (If Applicable).    Thank you for choosing [facility]!!

## 2017-04-25 NOTE — Progress Notes (Signed)
Patient Care Team: Iona Beard, MD as PCP - General (Family Medicine) Nicholas Lose, MD as Consulting Physician (Hematology and Oncology) Bensimhon, Shaune Pascal, MD as Consulting Physician (Cardiology)   HPI  Claudia [name] is a 61 y.o. female seen in follow-up for ICD implanted for primary prevention for nonischemic cardiomyopathy. She had previously implanted transvenous device removed because of infection.   She suffered appropriate ICD therapy 3/17.  Interestingly, she has slow ventricular tachycardia associated with T-wave oversensing which prompted the device therapy.  Beta blockers were increased;  She is doing well on increased dose of coreg  The patient denies chest pain, shortness of breath, nocturnal dyspnea, orthopnea or peripheral edema.  There have been no palpitations, lightheadedness or syncope.   She has sleep disordered breathing   She has awakened herself snoring, but does not have daytime somnolence.  Previously dx with OSA but does not know where CPAP is--  Records and Results Reviewed   Past Medical History:  Diagnosis Date  . AICD (automatic cardioverter/defibrillator) present 09/15/2014  . Breast cancer ([place])   . CHF (congestive heart failure) ([place])   . 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 CHOLECYSTECTOMY    . device removal     defib  . fiber tumor    . masectomy      Current Outpatient Prescriptions  Medication Sig Dispense Refill  . acetaminophen (TYLENOL) 325 MG tablet Take 650 mg by mouth every 6 (six) hours as needed.    . carvedilol (COREG) 25 MG tablet Take 1 tablet (25 mg total) by mouth 2 (two) times daily with a meal. Needs office visit 60 tablet 3  . eplerenone (INSPRA) 25 MG tablet Take 25 mg by mouth daily.    . furosemide (LASIX) 40 MG tablet TAKE 1 TABLET EVERY MORNING  AND TAKE 1/2 TABLET EVERY EVENING 135 tablet 3  . hydrALAZINE  (APRESOLINE) 25 MG tablet Take 37.5 mg by mouth 3 (three) times daily. Take 1 and 1/2 tablet daily by mouth    . isosorbide mononitrate (IMDUR) 30 MG 24 hr tablet Take one tablet (30 mg) by mouth at night 90 tablet 3  . lisinopril (PRINIVIL,ZESTRIL) 10 MG tablet TAKE 1 TABLET EVERY DAY 90 tablet 3  . rivaroxaban (XARELTO) 20 MG TABS tablet Take 1 tablet (20 mg total) by mouth daily with supper. Needs office visit 90 tablet 0   No current facility-administered medications for this visit.     No Known Allergies    Review of Systems negative except from HPI and PMH  Physical Exam BP 122/72   Pulse 68   Ht 5\' 6"  (1.676 m)   Wt 201 lb (91.2 kg)   BMI 32.44 kg/m   Well developed and nourished in no acute distress HENT normal Neck supple with JVP-flat Carotids brisk and full without bruits Clear Regular rate and rhythm,T resumed back on 2/6 m Abd-soft with active BS without hepatomegaly No Clubbing cyanosis edema Skin-warm and dry A & Oriented  Grossly normal sensory and motor function  ECG from 316 demonstrates sinus rhythm with probable limb lead reversal  Assessment and  Plan Nonischemic cardiomyopathy  S ICD  Ventricular tachycardia-   Atrial fibrillation-paroxysmal  DVT  Sleep disordered breathing  Euvolemic continue current meds  On Anticoagulation;  No bleeding issues   No intercurrent Ventricular tachycardia  She prob has sleep apnea with prev diagnosis but less  clear without fatigue daytime somnolence

## 2017-04-26 ENCOUNTER — Other Ambulatory Visit (HOSPITAL_COMMUNITY): Payer: Self-pay | Admitting: Cardiology

## 2017-04-26 LAB — CUP PACEART INCLINIC DEVICE CHECK
Implantable Lead Implant Date: 20150819
Implantable Lead Model: 3010
Implantable Pulse Generator Implant Date: 20150819
MDC IDC LEAD LOCATION: 753862
MDC IDC SESS DTM: 20180727100257
Pulse Gen Serial Number: 19497

## 2017-04-26 MED ORDER — LISINOPRIL 10 MG PO TABS: 10.0000 mg | ORAL_TABLET | Freq: Every day | ORAL | 3 refills | Status: DC

## 2017-04-26 NOTE — Addendum Note (Signed)
Addended by: Marlis Edelson C on: 04/26/2017 01:11 PM   Modules accepted: Orders

## 2017-05-17 ENCOUNTER — Other Ambulatory Visit (HOSPITAL_COMMUNITY): Payer: Self-pay | Admitting: *Deleted

## 2017-05-17 MED ORDER — CARVEDILOL 25 MG PO TABS: 25.0000 mg | ORAL_TABLET | Freq: Two times a day (BID) | ORAL | 3 refills | Status: DC

## 2017-05-17 MED ORDER — RIVAROXABAN 20 MG PO TABS: 20.0000 mg | ORAL_TABLET | Freq: Every day | ORAL | 0 refills | Status: DC

## 2017-05-20 DIAGNOSIS — Z0000 Encounter for general adult medical examination without abnormal findings: Secondary | ICD-10-CM | POA: Diagnosis not present

## 2017-05-20 DIAGNOSIS — Z95810 Presence of automatic (implantable) cardiac defibrillator: Secondary | ICD-10-CM | POA: Diagnosis not present

## 2017-05-20 DIAGNOSIS — I5042 Chronic combined systolic (congestive) and diastolic (congestive) heart failure: Secondary | ICD-10-CM | POA: Diagnosis not present

## 2017-05-20 DIAGNOSIS — N183 Chronic kidney disease, stage 3 (moderate): Secondary | ICD-10-CM | POA: Diagnosis not present

## 2017-05-20 DIAGNOSIS — I480 Paroxysmal atrial fibrillation: Secondary | ICD-10-CM | POA: Diagnosis not present

## 2017-07-15 DIAGNOSIS — Z23 Encounter for immunization: Secondary | ICD-10-CM | POA: Diagnosis not present

## 2017-07-25 ENCOUNTER — Ambulatory Visit (INDEPENDENT_AMBULATORY_CARE_PROVIDER_SITE_OTHER): Payer: Medicare HMO | Admitting: *Deleted

## 2017-07-25 DIAGNOSIS — Z95810 Presence of automatic (implantable) cardiac defibrillator: Secondary | ICD-10-CM

## 2017-07-25 DIAGNOSIS — I472 Ventricular tachycardia, unspecified: Secondary | ICD-10-CM

## 2017-07-25 DIAGNOSIS — I420 Dilated cardiomyopathy: Secondary | ICD-10-CM | POA: Diagnosis not present

## 2017-07-25 LAB — CUP PACEART INCLINIC DEVICE CHECK
Date Time Interrogation Session: 20181025114924
Implantable Lead Implant Date: 20150819
Implantable Lead Model: 3010
MDC IDC LEAD LOCATION: 753862
MDC IDC PG IMPLANT DT: 20150819
Pulse Gen Serial Number: 19497

## 2017-07-25 NOTE — Patient Instructions (Signed)
Your physician wants you to follow-up in: July 2019 with Dr. Caryl Comes. You will receive a reminder letter in the mail two months in advance. If you don't receive a letter, please call our office to schedule the follow-up appointment.

## 2017-07-25 NOTE — Progress Notes (Signed)
Subcutaneous ICD check in clinic. No untreated episodes; no treated episodes; no shocks delivered. Electrode impedance status okay. No programming changes. Remaining longevity to ERI 45%. ROV with Device Clinic in 3 months and ROV with SK in 03/2018.

## 2017-07-29 ENCOUNTER — Other Ambulatory Visit (HOSPITAL_COMMUNITY): Payer: Self-pay | Admitting: Adult Health

## 2017-08-16 ENCOUNTER — Other Ambulatory Visit (HOSPITAL_COMMUNITY): Payer: Self-pay | Admitting: Adult Health

## 2017-08-30 ENCOUNTER — Other Ambulatory Visit (HOSPITAL_COMMUNITY): Payer: Self-pay | Admitting: Pharmacist

## 2017-08-30 MED ORDER — RIVAROXABAN 20 MG PO TABS: 20.0000 mg | ORAL_TABLET | Freq: Every day | ORAL | 3 refills | Status: DC

## 2017-09-09 ENCOUNTER — Encounter: Payer: Commercial Managed Care - HMO | Admitting: Adult Health

## 2017-09-09 ENCOUNTER — Encounter: Payer: Medicare HMO | Admitting: Adult Health

## 2017-09-12 ENCOUNTER — Other Ambulatory Visit: Payer: Self-pay | Admitting: *Deleted

## 2017-09-13 MED ORDER — FUROSEMIDE 40 MG PO TABS: ORAL_TABLET | ORAL | 0 refills | Status: DC

## 2017-09-13 MED ORDER — CARVEDILOL 25 MG PO TABS: 25.0000 mg | ORAL_TABLET | Freq: Two times a day (BID) | ORAL | 0 refills | Status: DC

## 2017-09-27 ENCOUNTER — Ambulatory Visit (HOSPITAL_BASED_OUTPATIENT_CLINIC_OR_DEPARTMENT_OTHER): Payer: Medicare HMO | Admitting: Adult Health

## 2017-09-27 ENCOUNTER — Telehealth: Payer: Self-pay | Admitting: Adult Health

## 2017-09-27 VITALS — BP 128/90 | HR 88 | Temp 98.2°F | Resp 20 | Wt 212.3 lb

## 2017-09-27 DIAGNOSIS — I5089 Other heart failure: Secondary | ICD-10-CM | POA: Diagnosis not present

## 2017-09-27 DIAGNOSIS — Z853 Personal history of malignant neoplasm of breast: Secondary | ICD-10-CM | POA: Diagnosis not present

## 2017-09-27 DIAGNOSIS — Z170 Estrogen receptor positive status [ER+]: Principal | ICD-10-CM

## 2017-09-27 DIAGNOSIS — C50412 Malignant neoplasm of upper-outer quadrant of left female breast: Secondary | ICD-10-CM

## 2017-09-27 NOTE — Progress Notes (Signed)
CLINIC:  Survivorship   REASON FOR VISIT:  Routine follow-up for history of breast cancer.   BRIEF ONCOLOGIC HISTORY:    Breast cancer of upper-outer quadrant of left female breast ([place])   11/08/2006 Initial Diagnosis    left breast invasive ductal carcinoma with DCIS intermediate to high-grade, ER 0%, PR 0%, HER-2 positive ratio 5.3, Ki-67 54%, inflammatory breast cancer with [place], stage IIIB      12/03/2006 - 02/05/2007 Neo-Adjuvant Chemotherapy    neoadjuvant chemotherapy with AC X 4 (the entire treatment was given in [facility] by Dr. Jama Flavors      02/25/2007 Surgery    left mastectomy: IDC with DCIS 1 cm, lymphovascular invasion present, T1b N1 mic stage IB, 3/8 lymph nodes microscopic tumor less than 0.2 mm      04/02/2007 - 06/04/2007 Chemotherapy    Taxol 4 with Herceptin stopped early because of decreased ejection fraction to 15% in September 2008      06/24/2007 - 08/08/2007 Radiation Therapy    adjuvant radiation therapy treating supraclavicular and axillary areas and chest wall       Procedure    chronic heart failure under the care of Dr. Haroldine Laws, AICD ( at one point patient was evaluated for heart transplant in [facility])        INTERVAL HISTORY:  Claudia [name] presents to the [facility] today for routine follow-up for her history of breast cancer.  Overall, she reports feeling quite well. We reviewed her history of breast cancer, and her treatment related heart failure.  She is doing well from a cardiac standpoint.  She sees her PCP regularly, is up to date with her cancer screenings, and exercises.  She has no concerns as they relate to her breasts.  She has continued to have annual right breast mammograms in February every year.  She gets these done at [facility], so we do not have records of these.      REVIEW OF SYSTEMS:  Review of Systems  Constitutional: Negative for appetite change, chills, fatigue and fever.  HENT:   Negative for hearing  loss and lump/mass.   Eyes: Negative for eye problems and icterus.  Respiratory: Negative for chest tightness, cough and shortness of breath.   Cardiovascular: Negative for chest pain, leg swelling and palpitations.  Gastrointestinal: Negative for abdominal distention, abdominal pain, constipation, diarrhea and vomiting.  Endocrine: Negative for hot flashes.  Genitourinary: Negative for difficulty urinating.   Musculoskeletal: Negative for arthralgias.  Skin: Negative for itching and rash.  Neurological: Negative for dizziness, extremity weakness, headaches and numbness.  Hematological: Negative for adenopathy. Does not bruise/bleed easily.  Psychiatric/Behavioral: Negative for depression. The patient is not nervous/anxious.   Breast: Denies any new nodularity, masses, tenderness, nipple changes, or nipple discharge.       PAST MEDICAL/SURGICAL HISTORY:  Past Medical History:  Diagnosis Date  . AICD (automatic cardioverter/defibrillator) present 09/15/2014  . Breast cancer ([place])   . CHF (congestive heart failure) ([place])   . Implantable cardioverter-defibrillator infection  s/p extraction 10/07/2014   Past Surgical History:  Procedure Laterality Date  . CHOLECYSTECTOMY    . device removal     defib  . fiber tumor    . masectomy       ALLERGIES:  No Known Allergies   CURRENT MEDICATIONS:  Outpatient Encounter Medications as of 09/27/2017  Medication Sig  . acetaminophen (TYLENOL) 325 MG tablet Take 650 mg by mouth every 6 (six) hours as needed.  Marland Kitchen  carvedilol (COREG) 25 MG tablet Take 1 tablet (25 mg total) by mouth 2 (two) times daily with a meal. Needs office visit  . eplerenone (INSPRA) 25 MG tablet Take 25 mg by mouth daily.  . furosemide (LASIX) 40 MG tablet TAKE 1 TABLET EVERY MORNING  AND TAKE 1/2 TABLET EVERY EVENING  . hydrALAZINE (APRESOLINE) 25 MG tablet Take 37.5 mg by mouth 3 (three) times daily. Take 1 and 1/2 tablet daily by mouth  . isosorbide mononitrate  (IMDUR) 30 MG 24 hr tablet Take one tablet (30 mg) by mouth at night  . lisinopril (PRINIVIL,ZESTRIL) 10 MG tablet Take 1 tablet (10 mg total) by mouth daily.  . rivaroxaban (XARELTO) 20 MG TABS tablet Take 1 tablet (20 mg total) by mouth daily with supper.   No facility-administered encounter medications on file as of 09/27/2017.      ONCOLOGIC FAMILY HISTORY:  Family History  Problem Relation Age of Onset  . Diabetes Mother   . Cancer Father     GENETIC COUNSELING/TESTING: Not indicated  SOCIAL HISTORY:  Claudia [name] is divorced and lives alone in [place], [place].  She has 3 children and they live in [place].  Claudia [name] is currently on disability due to her cardiac issues.  She denies any current or history of tobacco, alcohol, or illicit drug use.     PHYSICAL EXAMINATION:  Vital Signs: Vitals:   09/27/17 1503  BP: 128/90  Pulse: 88  Resp: 20  Temp: 98.2 F (36.8 C)  SpO2: 100%   Filed Weights   09/27/17 1503  Weight: 212 lb 4.8 oz (96.3 kg)   General: Well-nourished, well-appearing female in no acute distress.  Unaccompanied today.   HEENT: Head is normocephalic.  Pupils equal and reactive to light. Conjunctivae clear without exudate.  Sclerae anicteric. Oral mucosa is pink, moist.  Oropharynx is pink without lesions or erythema.  Lymph: No cervical, supraclavicular, or infraclavicular lymphadenopathy noted on palpation.  Cardiovascular: Regular rate and rhythm.Marland Kitchen Respiratory: Clear to auscultation bilaterally. Chest expansion symmetric; breathing non-labored.  Breast Exam:  -Left breast: s/p mastectomy, no nodules, masses or sign of recurrence. -Right breast: No appreciable masses on palpation. No skin redness, thickening, or peau d'orange appearance; no nipple retraction or nipple discharge;  -Axilla: No axillary adenopathy bilaterally.  GI: Abdomen soft and round; non-tender, non-distended. Bowel sounds normoactive. No hepatosplenomegaly.   GU:  Deferred.  Neuro: No focal deficits. Steady gait.  Psych: Mood and affect normal and appropriate for situation.  MSK: No focal spinal tenderness to palpation, full range of motion in bilateral upper extremities Extremities: No edema. Skin: Warm and dry.  LABORATORY DATA:  None for this visit   DIAGNOSTIC IMAGING:  Most recent mammogram: requested from [place]:  Ms.. [name] is a pleasant 61 y.o. female with history of Stage IIIB left breast invasive ductal carcinoma, ER-/PR-/HER2+, diagnosed in 11/2006, treated with neoadjuvant chemotherpay, left mastectomy, adjuvant chemoetherapy with maintenance trastuzumab (stopped early due to decline in EF), adjuvant radiation therapy.  She presents to the Survivorship Clinic for surveillance and routine follow-up.   1. History of breast cancer:  Claudia [name] is currently clinically and radiographically without evidence of disease or recurrence of breast cancer. She will be due for mammogram in 11/2017.  I encouraged her to call me with any questions or concerns before her next visit at the cancer center, and I would be happy to see her sooner, if needed.    2.  Treatment related heart failure: She is a patient of Dr. Haroldine Laws and she tells me that from a cardiac standpoint she is doing really well.  She is tolerating her medications well and has a good quality of life.    3. Bone health:  Given Claudia [name]'s age, history of breast cancer, she is at risk for bone demineralization.  She was given education on specific food and activities to promote bone health.  4. Cancer screening:  Due to Claudia [name]'s history and her age, she should receive screening for skin cancers, colon cancer, and gynecologic cancers. She was encouraged to follow-up with her PCP for appropriate cancer screenings.   5. Health maintenance and wellness promotion: Claudia [name] was encouraged to consume 5-7 servings of fruits and vegetables per day. She was also encouraged to  engage in moderate to vigorous exercise for 30 minutes per day most days of the week. She was instructed to limit her alcohol consumption and continue to abstain from tobacco use.    Dispo:  -Return to cancer center in one year for LTS follow up -Mammogram in 11/2017   A total of (30) minutes of face-to-face time was spent with this patient with greater than 50% of that time in counseling and care-coordination.   Gardenia Phlegm, NP Survivorship Program [facility] [phone]   Note: PRIMARY CARE PROVIDER Iona Beard, [place] [phone]

## 2017-09-27 NOTE — Telephone Encounter (Signed)
Gave patient AVS and calendar of upcoming December 2019 appointments.  °

## 2017-09-28 ENCOUNTER — Encounter: Payer: Self-pay | Admitting: Adult Health

## 2017-09-28 DIAGNOSIS — I5042 Chronic combined systolic (congestive) and diastolic (congestive) heart failure: Secondary | ICD-10-CM | POA: Diagnosis not present

## 2017-09-28 DIAGNOSIS — I480 Paroxysmal atrial fibrillation: Secondary | ICD-10-CM | POA: Diagnosis not present

## 2017-09-28 DIAGNOSIS — Z7901 Long term (current) use of anticoagulants: Secondary | ICD-10-CM | POA: Diagnosis not present

## 2017-09-28 DIAGNOSIS — N183 Chronic kidney disease, stage 3 (moderate): Secondary | ICD-10-CM | POA: Diagnosis not present

## 2017-09-28 DIAGNOSIS — R1013 Epigastric pain: Secondary | ICD-10-CM | POA: Diagnosis not present

## 2017-09-28 DIAGNOSIS — Z95810 Presence of automatic (implantable) cardiac defibrillator: Secondary | ICD-10-CM | POA: Diagnosis not present

## 2017-10-17 ENCOUNTER — Other Ambulatory Visit: Payer: Self-pay | Admitting: Cardiology

## 2017-10-31 ENCOUNTER — Ambulatory Visit (INDEPENDENT_AMBULATORY_CARE_PROVIDER_SITE_OTHER): Payer: Medicare HMO | Admitting: *Deleted

## 2017-10-31 ENCOUNTER — Other Ambulatory Visit (HOSPITAL_COMMUNITY): Payer: Self-pay | Admitting: Pharmacist

## 2017-10-31 DIAGNOSIS — I4729 Other ventricular tachycardia: Secondary | ICD-10-CM

## 2017-10-31 DIAGNOSIS — I472 Ventricular tachycardia: Secondary | ICD-10-CM

## 2017-10-31 LAB — CUP PACEART INCLINIC DEVICE CHECK
Implantable Lead Implant Date: 20150819
Implantable Lead Model: 3010
MDC IDC LEAD LOCATION: 753862
MDC IDC PG IMPLANT DT: 20150819
MDC IDC PG SERIAL: 19497
MDC IDC SESS DTM: 20190131150742

## 2017-10-31 MED ORDER — EPLERENONE 25 MG PO TABS: 25.0000 mg | ORAL_TABLET | Freq: Every day | ORAL | 5 refills | Status: DC

## 2017-10-31 MED ORDER — CARVEDILOL 25 MG PO TABS: 25.0000 mg | ORAL_TABLET | Freq: Two times a day (BID) | ORAL | 5 refills | Status: DC

## 2017-10-31 MED ORDER — FUROSEMIDE 40 MG PO TABS: ORAL_TABLET | ORAL | 5 refills | Status: DC

## 2017-10-31 NOTE — Progress Notes (Signed)
Subcutaneous ICD check in clinic. 0  untreated episodes; 0 treated episodes;  0 shocks delivered. Electrode impedance status okay. No programming changes. Remaining longevity to ERI  41%. ROV 01/30/18 for device check.

## 2017-11-02 ENCOUNTER — Other Ambulatory Visit: Payer: Self-pay | Admitting: Cardiology

## 2017-11-06 DIAGNOSIS — C50912 Malignant neoplasm of unspecified site of left female breast: Secondary | ICD-10-CM | POA: Diagnosis not present

## 2017-11-28 ENCOUNTER — Encounter: Payer: Self-pay | Admitting: Adult Health

## 2017-11-28 DIAGNOSIS — Z853 Personal history of malignant neoplasm of breast: Secondary | ICD-10-CM | POA: Diagnosis not present

## 2017-11-28 DIAGNOSIS — Z1231 Encounter for screening mammogram for malignant neoplasm of breast: Secondary | ICD-10-CM | POA: Diagnosis not present

## 2017-12-04 ENCOUNTER — Ambulatory Visit (HOSPITAL_COMMUNITY)
Admission: RE | Admit: 2017-12-04 | Discharge: 2017-12-04 | Disposition: A | Discharge status: Home / Self Care | Payer: Medicare HMO | Source: Ambulatory Visit | Attending: Internal Medicine | Admitting: Internal Medicine

## 2017-12-04 ENCOUNTER — Encounter (HOSPITAL_COMMUNITY): Payer: Self-pay | Admitting: Internal Medicine

## 2017-12-04 VITALS — BP 142/90 | HR 75 | Wt 211.8 lb

## 2017-12-04 DIAGNOSIS — Z853 Personal history of malignant neoplasm of breast: Secondary | ICD-10-CM | POA: Diagnosis not present

## 2017-12-04 DIAGNOSIS — Z9221 Personal history of antineoplastic chemotherapy: Secondary | ICD-10-CM | POA: Diagnosis not present

## 2017-12-04 DIAGNOSIS — Z87891 Personal history of nicotine dependence: Secondary | ICD-10-CM | POA: Diagnosis not present

## 2017-12-04 DIAGNOSIS — I5022 Chronic systolic (congestive) heart failure: Secondary | ICD-10-CM | POA: Insufficient documentation

## 2017-12-04 DIAGNOSIS — E785 Hyperlipidemia, unspecified: Secondary | ICD-10-CM | POA: Diagnosis not present

## 2017-12-04 DIAGNOSIS — Z833 Family history of diabetes mellitus: Secondary | ICD-10-CM | POA: Insufficient documentation

## 2017-12-04 DIAGNOSIS — Z79899 Other long term (current) drug therapy: Secondary | ICD-10-CM | POA: Insufficient documentation

## 2017-12-04 DIAGNOSIS — Z7902 Long term (current) use of antithrombotics/antiplatelets: Secondary | ICD-10-CM | POA: Insufficient documentation

## 2017-12-04 DIAGNOSIS — Z95810 Presence of automatic (implantable) cardiac defibrillator: Secondary | ICD-10-CM | POA: Insufficient documentation

## 2017-12-04 DIAGNOSIS — Z9889 Other specified postprocedural states: Secondary | ICD-10-CM | POA: Insufficient documentation

## 2017-12-04 DIAGNOSIS — I10 Essential (primary) hypertension: Secondary | ICD-10-CM | POA: Diagnosis not present

## 2017-12-04 DIAGNOSIS — Z86718 Personal history of other venous thrombosis and embolism: Secondary | ICD-10-CM | POA: Insufficient documentation

## 2017-12-04 DIAGNOSIS — Z809 Family history of malignant neoplasm, unspecified: Secondary | ICD-10-CM | POA: Diagnosis not present

## 2017-12-04 DIAGNOSIS — I480 Paroxysmal atrial fibrillation: Secondary | ICD-10-CM | POA: Diagnosis not present

## 2017-12-04 DIAGNOSIS — I110 Hypertensive heart disease with heart failure: Secondary | ICD-10-CM | POA: Insufficient documentation

## 2017-12-04 LAB — CBC
HCT: 39.7 % (ref 36.0–46.0)
HEMOGLOBIN: 12.6 g/dL (ref 12.0–15.0)
MCH: 26.8 pg (ref 26.0–34.0)
MCHC: 31.7 g/dL (ref 30.0–36.0)
MCV: 84.3 fL (ref 78.0–100.0)
Platelets: 269 10*3/uL (ref 150–400)
RBC: 4.71 MIL/uL (ref 3.87–5.11)
RDW: 13.5 % (ref 11.5–15.5)
WBC: 4.3 10*3/uL (ref 4.0–10.5)

## 2017-12-04 LAB — BASIC METABOLIC PANEL
ANION GAP: 11 (ref 5–15)
BUN: 15 mg/dL (ref 6–20)
CALCIUM: 9.4 mg/dL (ref 8.9–10.3)
CO2: 24 mmol/L (ref 22–32)
Chloride: 100 mmol/L — ABNORMAL LOW (ref 101–111)
Creatinine, Ser: 1.14 mg/dL — ABNORMAL HIGH (ref 0.44–1.00)
GFR calc non Af Amer: 50 mL/min — ABNORMAL LOW (ref >60)
GFR, EST AFRICAN AMERICAN: 58 mL/min — AB (ref >60)
GLUCOSE: 105 mg/dL — AB (ref 65–99)
Potassium: 4.6 mmol/L (ref 3.5–5.1)
SODIUM: 135 mmol/L (ref 135–145)

## 2017-12-04 NOTE — Addendum Note (Signed)
Encounter addended by: Scarlette Calico, RN on: 12/04/2017 12:11 PM  Actions taken: Order list changed, Diagnosis association updated, Sign clinical note

## 2017-12-04 NOTE — Patient Instructions (Signed)
We will contact you in 6 months to schedule your next appointment and echocardiogram  

## 2017-12-04 NOTE — Progress Notes (Signed)
Patient ID: Claudia [name], female   DOB: 29-Sep-1956, 62 y.o.   MRN: [id number]  Primary Cardiologist: Dr Claudia [name] PCP: Dr Claudia [name]  Oncology: Dr Claudia [name].  EP: Dr Claudia [name]  HPI: Ms. Claudia [name] is a 62 y/o woman with h/o breast cancer in 1308 and systolic HF due to chemotoxicity. She also h/o PAF and previous DVT for which she is on chronic anticoagulation. She moved from [place] in 07/2014 and saw Dr. Johnsie [name].   She developed breast cancer in 2008 and underwent chemotherapy and in the wake of this, she developed congestive heart failure with EF 20%. Cardiac cath with normal coronaries. She received an ICD It became infected in the spring of 2015 with staph and she underwent extraction. She has undergone S ICD implantation following prolonged use of a LifeVest. She was ultimately transferred to [facility] in [place] in 01/2014 for consideration of heart transplantation. They diuresed her over 60 pounds and she improved markedly.   March 2017 after  ICD fired x3. Device was reprogrammed and bb was increased 18.75 mg twice a day. ECHO was repeated EF 45%. Dischaege weight 184 pounds.   Today she returns for HF follow up we have not seen her in almost 2 years. Exercising 4-5x/week with water aerobics and Zumba. No CP or SOB. No edema. No orthopnea or PND.Taking all meds without problems. No further ICD firings.    ECHO 11/2015: EF 45% Grade I DD ECHO 10/07/14 and EF 40-45% with normal RV.   Labs: 08/24/14: Potassium 4.2 Creatinine 1.07 hgb 13.1 01/31/2015: K 4.0 Creatinine 1.28   ROS: All systems negative except as listed in HPI, PMH and Problem List.  SH:  Social History   Socioeconomic History  . Marital status: Divorced    Spouse name: Not on file  . Number of children: Not on file  . Years of education: Not on file  . Highest education level: Not on file  Social Needs  . Financial resource strain: Not on file  . Food insecurity - worry: Not on file  . Food insecurity - inability: Not on file  .  Transportation needs - medical: Not on file  . Transportation needs - non-medical: Not on file  Occupational History  . Not on file  Tobacco Use  . Smoking status: Former Research scientist (life sciences)  . Smokeless tobacco: Never Used  Substance and Sexual Activity  . Alcohol use: No  . Drug use: No  . Sexual activity: Not on file  Other Topics Concern  . Not on file  Social History Narrative  . Not on file    FH:  Family History  Problem Relation Age of Onset  . Diabetes Claudia [name]   . Cancer Claudia [name]     Past Medical History:  Diagnosis Date  . AICD (automatic cardioverter/defibrillator) present 09/15/2014  . Breast cancer (Claudia [name])   . CHF (congestive heart failure) ([place])   . Implantable cardioverter-defibrillator infection  s/p extraction 10/07/2014    Current Outpatient Medications  Medication Sig Dispense Refill  . acetaminophen (TYLENOL) 325 MG tablet Take 650 mg by mouth every 6 (six) hours as needed.    . carvedilol (COREG) 25 MG tablet TAKE 1 TABLET (25 MG TOTAL) BY MOUTH 2 (TWO) TIMES DAILY WITH A MEAL. NEEDS OFFICE VISIT 60 tablet 0  . cyclobenzaprine (FLEXERIL) 5 MG tablet TK 1 T PO QHS FOR SPASM  2  . eplerenone (INSPRA) 25 MG tablet Take 1 tablet (25 mg total) by mouth daily. 30 tablet 5  . furosemide (LASIX)  40 MG tablet TAKE 1 TABLET EVERY MORNING  AND TAKE 1/2 TABLET EVERY EVENING 45 tablet 0  . hydrALAZINE (APRESOLINE) 25 MG tablet Take 37.5 mg by mouth 3 (three) times daily. Take 1 and 1/2 tablet daily by mouth    . isosorbide mononitrate (IMDUR) 30 MG 24 hr tablet Take one tablet (30 mg) by mouth at night 90 tablet 3  . lisinopril (PRINIVIL,ZESTRIL) 10 MG tablet Take 1 tablet (10 mg total) by mouth daily. 90 tablet 3  . rivaroxaban (XARELTO) 20 MG TABS tablet Take 1 tablet (20 mg total) by mouth daily with supper. 90 tablet 3   No current facility-administered medications for this encounter.     Vitals:   12/04/17 1109  BP: (!) 142/90  Pulse: 75  SpO2: 97%  Weight: 211 lb 12.8 oz  (96.1 kg)    PHYSICAL EXAM:  General:  Well appearing. No resp difficulty HEENT: normal Neck: supple. JVP 7. Carotids 2+ bilat; no bruits. No lymphadenopathy or thryomegaly appreciated. Cor: PMI nondisplaced. Regular rate & rhythm. No rubs, gallops or murmurs. Lungs: clear Abdomen: obese soft, nontender, nondistended. No hepatosplenomegaly. No bruits or masses. Good bowel sounds. Extremities: no cyanosis, clubbing, rash, tr edema Neuro: alert & orientedx3, cranial nerves grossly intact. moves all 4 extremities w/o difficulty. Affect pleasant   ASSESSMENT & PLAN:  1. Chronic systolic HF - due to chemotoxicity from breast CA. EF in January 2016 40-45% . Cath normal in [place].  - NYHA I. Doing very well - Continue current dose of carvedilol, inspra, lisinopril, hydralazine, and imdur. EF > 40% so will not switch to Entresto. Says BP runs low at home so cannot titrate.  - Will repeat echo. If EF dropping will need PYP scan to further evaluate.  - Check labs today Check BMET today  2. Breast CA  - s/p treatment 2008. No evidence of recurrence 3. Paroxysmal AF  - In NSR today On Xarelto. No Bleeding problem.  4. H/o DVT- on xarelto . No bleeding problems.  5. Hyperlipidemia - on crestor. Followed by Dr. Criss [name] 6. H/O VT ICD discharge x3 11/2015. Device reprogrammed and BB increased. She has follow up with Dr. Caryl [name].   7. HTN - BP high here but low at home. Will not change.   Claudia Bickers MD 11:42 AM

## 2017-12-30 DIAGNOSIS — Z7901 Long term (current) use of anticoagulants: Secondary | ICD-10-CM | POA: Diagnosis not present

## 2017-12-30 DIAGNOSIS — I5042 Chronic combined systolic (congestive) and diastolic (congestive) heart failure: Secondary | ICD-10-CM | POA: Diagnosis not present

## 2017-12-30 DIAGNOSIS — N183 Chronic kidney disease, stage 3 (moderate): Secondary | ICD-10-CM | POA: Diagnosis not present

## 2017-12-30 DIAGNOSIS — I480 Paroxysmal atrial fibrillation: Secondary | ICD-10-CM | POA: Diagnosis not present

## 2018-01-11 ENCOUNTER — Other Ambulatory Visit (HOSPITAL_COMMUNITY): Payer: Self-pay | Admitting: Internal Medicine

## 2018-01-30 ENCOUNTER — Ambulatory Visit (INDEPENDENT_AMBULATORY_CARE_PROVIDER_SITE_OTHER): Payer: Medicare HMO | Admitting: *Deleted

## 2018-01-30 DIAGNOSIS — I420 Dilated cardiomyopathy: Secondary | ICD-10-CM

## 2018-01-30 DIAGNOSIS — I472 Ventricular tachycardia, unspecified: Secondary | ICD-10-CM

## 2018-01-30 DIAGNOSIS — Z95810 Presence of automatic (implantable) cardiac defibrillator: Secondary | ICD-10-CM | POA: Diagnosis not present

## 2018-01-30 LAB — CUP PACEART INCLINIC DEVICE CHECK
Date Time Interrogation Session: 20190502152621
Implantable Lead Location: 753862
Implantable Pulse Generator Implant Date: 20150819
MDC IDC LEAD IMPLANT DT: 20150819
Pulse Gen Model: 1010
Pulse Gen Serial Number: 19497

## 2018-01-30 NOTE — Progress Notes (Signed)
Subcutaneous ICD check in clinic. No untreated episodes; no treated episodes; no shocks delivered. Electrode impedance status okay. No programming changes. Remaining longevity to ERI 41%. ROV with SK on 05/15/18.

## 2018-02-06 ENCOUNTER — Ambulatory Visit (HOSPITAL_COMMUNITY)
Admission: EM | Admit: 2018-02-06 | Discharge: 2018-02-06 | Disposition: A | Discharge status: Home / Self Care | Payer: Medicare HMO | Attending: Family Medicine | Admitting: Family Medicine

## 2018-02-06 ENCOUNTER — Encounter (HOSPITAL_COMMUNITY): Payer: Self-pay | Admitting: Emergency Medicine

## 2018-02-06 DIAGNOSIS — S0501XA Injury of conjunctiva and corneal abrasion without foreign body, right eye, initial encounter: Secondary | ICD-10-CM

## 2018-02-06 MED ORDER — ERYTHROMYCIN 5 MG/GM OP OINT: TOPICAL_OINTMENT | OPHTHALMIC | 0 refills | Status: DC

## 2018-02-06 NOTE — ED Provider Notes (Signed)
[name]    CSN: [id number] Arrival date & time: 02/06/18  1009     History   Chief Complaint Chief Complaint  Patient presents with  . Itchy Eye    HPI Claudia [name] is a 62 y.o. female.   HPI Patient woke up with eye irritation yesterday.  It is worse today.  This morning her eye was matted shut with yellow crusty discharge.  Is very photophobic.  It is very irritated and feels like there might be something in it.  She does not remember any trauma. She states that her vision in that eye is normal. No underlying eye disease. Visual acuity is normal. Past Medical History:  Diagnosis Date  . AICD (automatic cardioverter/defibrillator) present 09/15/2014  . Breast cancer ([place])   . CHF (congestive heart failure) ([place])   . Implantable cardioverter-defibrillator infection  s/p extraction 10/07/2014    Patient Active Problem List   Diagnosis Date Noted  . Paroxysmal ventricular tachycardia ([place]) 12/16/2015  . Elevated troponin 12/16/2015  . ICD (implantable cardioverter-defibrillator) discharge 12/15/2015  . Breast cancer of upper-outer quadrant of left female breast ([place]) 09/08/2015  . History of DVT (deep vein thrombosis) 08/02/2015  . History of breast cancer 08/02/2015  . Nonischemic cardiomyopathy ([place]) 10/07/2014  . Implantable cardioverter-defibrillator infection  s/p extraction 10/07/2014  . Chronic systolic heart failure ([place]) 09/15/2014  . PAF (paroxysmal atrial fibrillation) ([place]) 09/15/2014  . AICD (automatic cardioverter/defibrillator) present 09/15/2014    Past Surgical History:  Procedure Laterality Date  . CHOLECYSTECTOMY    . device removal     defib  . fiber tumor    . masectomy      OB History   None      Home Medications    Prior to Admission medications   Medication Sig Start Date End Date Taking? Authorizing Provider  acetaminophen (TYLENOL) 325 MG tablet Take 650 mg by mouth every 6 (six) hours as needed.    [provider]  carvedilol (COREG) 25 MG tablet TAKE 1 TABLET (25 MG TOTAL) BY MOUTH 2 (TWO) TIMES DAILY WITH A MEAL. NEEDS OFFICE VISIT 11/04/17   Bensimhon, Shaune Pascal, MD  cyclobenzaprine (FLEXERIL) 5 MG tablet TK 1 T PO QHS FOR SPASM 08/06/17   [provider]  eplerenone (INSPRA) 25 MG tablet Take 1 tablet (25 mg total) by mouth daily. 10/31/17   Larey Dresser, MD  erythromycin ophthalmic ointment Place a 1/2 inch ribbon of ointment into the lower eyelid. Every 4 hours while awake.  Apply at bedtime 02/06/18   Raylene Everts, MD  furosemide (LASIX) 40 MG tablet TAKE 1 TABLET EVERY MORNING  AND TAKE 1/2 TABLET EVERY EVENING 11/04/17   Bensimhon, Shaune Pascal, MD  hydrALAZINE (APRESOLINE) 25 MG tablet Take 37.5 mg by mouth 3 (three) times daily. Take 1 and 1/2 tablet daily by mouth    [provider]  isosorbide mononitrate (IMDUR) 30 MG 24 hr tablet Take one tablet (30 mg) by mouth at night 12/10/16   Bensimhon, Shaune Pascal, MD  isosorbide mononitrate (IMDUR) 30 MG 24 hr tablet TAKE 1 TABLET AT NIGHT 01/13/18   Bensimhon, Shaune Pascal, MD  lisinopril (PRINIVIL,ZESTRIL) 10 MG tablet Take 1 tablet (10 mg total) by mouth daily. 04/26/17   Larey Dresser, MD  rivaroxaban (XARELTO) 20 MG TABS tablet Take 1 tablet (20 mg total) by mouth daily with supper. 08/30/17   Deboraha Sprang, MD    Family History Family History  Problem Relation Age of Onset  . Diabetes Mother   . Cancer Father     Social History Social History   Tobacco Use  . Smoking status: Former Research scientist (life sciences)  . Smokeless tobacco: Never Used  Substance Use Topics  . Alcohol use: No  . Drug use: No     Allergies   Patient has no known allergies.   Review of Systems Review of Systems  Constitutional: Negative for chills and fever.  HENT: Negative for ear pain and sore throat.   Eyes: Positive for photophobia, discharge, redness and itching. Negative for pain and visual disturbance.  Respiratory: Negative for cough and  shortness of breath.   Cardiovascular: Negative for chest pain and palpitations.  Gastrointestinal: Negative for abdominal pain and vomiting.  Genitourinary: Negative for dysuria and hematuria.  Musculoskeletal: Negative for arthralgias and back pain.  Skin: Negative for color change and rash.  Neurological: Negative for seizures and syncope.  All other systems reviewed and are negative.    Physical Exam Triage Vital Signs ED Triage Vitals [02/06/18 1045]  Enc Vitals Group     BP (!) 146/118     Pulse Rate 68     Resp 16     Temp 98 F (36.7 C)     Temp Source Oral     SpO2 97 %     Weight      Height      Head Circumference      Peak Flow      Pain Score      Pain Loc      Pain Edu?      Excl. in [place]?    No data found.  Updated Vital Signs BP (!) 146/118 (BP Location: Right Wrist)   Pulse 68   Temp 98 F (36.7 C) (Oral)   Resp 16   SpO2 97%       Physical Exam  Constitutional: She appears well-developed and well-nourished. No distress.  HENT:  Head: Normocephalic and atraumatic.  Eyes: Pupils are equal, round, and reactive to light. EOM are normal. Lids are everted and swept, no foreign bodies found. No foreign body present in the right eye. No foreign body present in the left eye. Right conjunctiva is injected.    Neck: Neck supple.  Cardiovascular: Normal rate and regular rhythm.  No murmur heard. Pulmonary/Chest: Effort normal and breath sounds normal. No respiratory distress.  Musculoskeletal: She exhibits no edema.  Neurological: She is alert.  Skin: Skin is warm and dry.  Psychiatric: She has a normal mood and affect.  Nursing note and vitals reviewed.    UC Treatments / Results  Labs (all labs ordered are listed, but only abnormal results are displayed) Labs Reviewed - No data to display  EKG None  Radiology No results found.  Procedures Procedures (including critical care time)  Medications Ordered in UC Medications - No data to  display  Initial Impression / Assessment and Plan / UC Course  I have reviewed the triage vital signs and the nursing notes.  Pertinent labs & imaging results that were available during my care of the patient were reviewed by me and considered in my medical decision making (see chart for details).      Final Clinical Impressions(s) / UC Diagnoses   Final diagnoses:  Conjunctival abrasion, right, initial encounter     Discharge Instructions     Apply ointment to eye every 4 hours while awake Apply just bedtime to last overnight Quiet activities  at home for a couple of days Return or see eye doctor if not improving by Monday.     ED Prescriptions    Medication Sig Dispense Auth. Provider   erythromycin ophthalmic ointment Place a 1/2 inch ribbon of ointment into the lower eyelid. Every 4 hours while awake.  Apply at bedtime 1 g Raylene Everts, MD     Controlled Substance Prescriptions [employer] Controlled Substance Registry consulted? Not Applicable   Raylene Everts, MD 02/06/18 1208

## 2018-02-06 NOTE — Discharge Instructions (Addendum)
Apply ointment to eye every 4 hours while awake Apply just bedtime to last overnight Quiet activities at home for a couple of days Return or see eye doctor if not improving by Monday.

## 2018-02-06 NOTE — ED Triage Notes (Signed)
Pt c/o itchy irritated R eye x2 days.

## 2018-02-16 ENCOUNTER — Encounter (HOSPITAL_COMMUNITY): Payer: Self-pay | Admitting: Emergency Medicine

## 2018-02-16 ENCOUNTER — Ambulatory Visit (HOSPITAL_COMMUNITY)
Admission: EM | Admit: 2018-02-16 | Discharge: 2018-02-16 | Disposition: A | Discharge status: Home / Self Care | Payer: Medicare HMO | Attending: Family Medicine | Admitting: Family Medicine

## 2018-02-16 ENCOUNTER — Other Ambulatory Visit: Payer: Self-pay

## 2018-02-16 DIAGNOSIS — H1032 Unspecified acute conjunctivitis, left eye: Secondary | ICD-10-CM | POA: Diagnosis not present

## 2018-02-16 MED ORDER — ERYTHROMYCIN 5 MG/GM OP OINT: TOPICAL_OINTMENT | OPHTHALMIC | 0 refills | Status: DC

## 2018-02-16 NOTE — Discharge Instructions (Addendum)
Apply warm compress to the affected side. Use the antibiotic as prescribed.

## 2018-02-16 NOTE — ED Provider Notes (Signed)
02/16/2018 1:33 PM   DOB: 1956/05/08 / MRN: [id number]  SUBJECTIVE:  Claudia [name] is a 62 y.o. female presenting for left eye conjunctivitis after having similar symptom last week in the right eye.  No photophobia, vision changes, contact lens use, frank eye pain or foreign body sensation.  She has No Known Allergies.   She  has a past medical history of AICD (automatic cardioverter/defibrillator) present (09/15/2014), Breast cancer ([place]), CHF (congestive heart failure) ([place]), and Implantable cardioverter-defibrillator infection  s/p extraction (10/07/2014).    She  reports that she has quit smoking. She has never used smokeless tobacco. She reports that she does not drink alcohol or use drugs. She  has no sexual activity history on file. The patient  has a past surgical history that includes masectomy; Cholecystectomy; fiber tumor; and device removal.  Her family history includes Cancer in her father; Diabetes in her mother.  Review of Systems  Skin: Negative for rash.  Neurological: Negative for dizziness and headaches.    OBJECTIVE:  BP (!) 148/79 (BP Location: Right Arm) Comment (BP Location): large cuff  Pulse 70   Temp 98.6 F (37 C) (Oral)   SpO2 100%   Wt Readings from Last 3 Encounters:  12/04/17 211 lb 12.8 oz (96.1 kg)  09/27/17 212 lb 4.8 oz (96.3 kg)  04/25/17 201 lb (91.2 kg)   Temp Readings from Last 3 Encounters:  02/16/18 98.6 F (37 C) (Oral)  02/06/18 98 F (36.7 C) (Oral)  09/27/17 98.2 F (36.8 C) (Oral)   BP Readings from Last 3 Encounters:  02/16/18 (!) 148/79  02/06/18 (!) 146/118  12/04/17 (!) 142/90   Pulse Readings from Last 3 Encounters:  02/16/18 70  02/06/18 68  12/04/17 75    Physical Exam  Constitutional: She is oriented to person, place, and time. She appears well-nourished. No distress.  Eyes: Pupils are equal, round, and reactive to light. EOM are normal.  Cardiovascular: Normal rate.  Pulmonary/Chest: Effort normal.  Abdominal:  She exhibits no distension.  Neurological: She is alert and oriented to person, place, and time. No cranial nerve deficit. Gait normal.  Skin: Skin is dry. She is not diaphoretic.  Psychiatric: She has a normal mood and affect.  Vitals reviewed.   No results found for this or any previous visit (from the past 72 hour(s)).  No results found.  ASSESSMENT AND PLAN:   Acute conjunctivitis of left eye, unspecified acute conjunctivitis type: No red flags. See avs.     Discharge Instructions     Apply warm compress to the affected side. Use the antibiotic as prescribed.        The patient is advised to call or return to clinic if she does not see an improvement in symptoms, or to seek the care of the closest emergency department if she worsens with the above plan.   Philis Fendt, MHS, PA-C 02/16/2018 1:33 PM   Tereasa Coop, PA-C 02/16/18 1334

## 2018-02-16 NOTE — ED Triage Notes (Signed)
Left eye swelling started Thursday night.  Patient said she was recently treated for an abrasion in right eye just prior to this issue with the left eye.  Left eye swelling, redness and irritated.

## 2018-03-31 DIAGNOSIS — N183 Chronic kidney disease, stage 3 (moderate): Secondary | ICD-10-CM | POA: Diagnosis not present

## 2018-03-31 DIAGNOSIS — I480 Paroxysmal atrial fibrillation: Secondary | ICD-10-CM | POA: Diagnosis not present

## 2018-03-31 DIAGNOSIS — Z7901 Long term (current) use of anticoagulants: Secondary | ICD-10-CM | POA: Diagnosis not present

## 2018-03-31 DIAGNOSIS — Z95810 Presence of automatic (implantable) cardiac defibrillator: Secondary | ICD-10-CM | POA: Diagnosis not present

## 2018-03-31 DIAGNOSIS — I5042 Chronic combined systolic (congestive) and diastolic (congestive) heart failure: Secondary | ICD-10-CM | POA: Diagnosis not present

## 2018-04-23 ENCOUNTER — Other Ambulatory Visit: Payer: Self-pay | Admitting: Cardiology

## 2018-05-14 DIAGNOSIS — H524 Presbyopia: Secondary | ICD-10-CM | POA: Diagnosis not present

## 2018-05-14 DIAGNOSIS — H52209 Unspecified astigmatism, unspecified eye: Secondary | ICD-10-CM | POA: Diagnosis not present

## 2018-05-14 DIAGNOSIS — H5213 Myopia, bilateral: Secondary | ICD-10-CM | POA: Diagnosis not present

## 2018-05-15 ENCOUNTER — Encounter: Payer: Self-pay | Admitting: Internal Medicine

## 2018-05-15 ENCOUNTER — Ambulatory Visit (INDEPENDENT_AMBULATORY_CARE_PROVIDER_SITE_OTHER): Payer: Medicare HMO | Admitting: Internal Medicine

## 2018-05-15 VITALS — BP 122/66 | HR 65 | Ht 66.0 in | Wt 214.0 lb

## 2018-05-15 DIAGNOSIS — I472 Ventricular tachycardia, unspecified: Secondary | ICD-10-CM

## 2018-05-15 DIAGNOSIS — Z95810 Presence of automatic (implantable) cardiac defibrillator: Secondary | ICD-10-CM | POA: Diagnosis not present

## 2018-05-15 DIAGNOSIS — I420 Dilated cardiomyopathy: Secondary | ICD-10-CM

## 2018-05-15 LAB — CUP PACEART INCLINIC DEVICE CHECK
Implantable Lead Implant Date: 20150819
Implantable Lead Location: 753862
Implantable Lead Model: 3010
Implantable Pulse Generator Implant Date: 20150819
MDC IDC SESS DTM: 20190815201320
Pulse Gen Serial Number: 19497

## 2018-05-15 NOTE — Progress Notes (Signed)
Patient Care Team: Iona Beard, MD as PCP - General (Family Medicine) Nicholas Lose, MD as Consulting Physician (Hematology and Oncology) Bensimhon, Shaune Pascal, MD as Consulting Physician (Cardiology)   HPI  Claudia [name] is a 62 y.o. female seen in follow-up for ICD implanted for primary prevention for nonischemic cardiomyopathy. She had previously implanted transvenous device removed because of infection.   She suffered appropriate ICD therapy 3/17.  Interestingly, she has slow ventricular tachycardia associated with T-wave oversensing which prompted the device therapy.  Beta blockers were increased;  She is doing well on increased dose of coreg  The patient denies chest pain, shortness of breath, nocturnal dyspnea, orthopnea or peripheral edema.  There have been no palpitations, lightheadedness or syncope.   On Anticoagulation;  No bleeding issues   Records and Results Reviewed   Past Medical History:  Diagnosis Date  . AICD (automatic cardioverter/defibrillator) present 09/15/2014  . Breast cancer ([place])   . CHF (congestive heart failure) ([place])   . 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 CHOLECYSTECTOMY    . device removal     defib  . fiber tumor    . masectomy      Current Outpatient Medications  Medication Sig Dispense Refill  . acetaminophen (TYLENOL) 325 MG tablet Take 650 mg by mouth every 6 (six) hours as needed.    . carvedilol (COREG) 25 MG tablet TAKE 1 TABLET TWICE DAILY WITH MEALS 180 tablet 0  . cyclobenzaprine (FLEXERIL) 5 MG tablet TK 1 T PO QHS FOR SPASM  2  . eplerenone (INSPRA) 25 MG tablet Take 1 tablet (25 mg total) by mouth daily. 30 tablet 5  . erythromycin ophthalmic ointment Place a 1/2 inch ribbon of ointment into the lower eyelid. Every 4 hours while awake.  Apply at bedtime 1 g 0  . furosemide (LASIX) 40 MG tablet TAKE 1 TABLET EVERY MORNING AND  TAKE 1/2 TABLET EVERY EVENING 135 tablet 0  . hydrALAZINE (APRESOLINE) 25 MG tablet Take 37.5 mg by mouth 3 (three) times daily. Take 1 and 1/2 tablet daily by mouth    . isosorbide mononitrate (IMDUR) 30 MG 24 hr tablet Take one tablet (30 mg) by mouth at night 90 tablet 3  . isosorbide mononitrate (IMDUR) 30 MG 24 hr tablet TAKE 1 TABLET AT NIGHT 90 tablet 3  . lisinopril (PRINIVIL,ZESTRIL) 10 MG tablet Take 1 tablet (10 mg total) by mouth daily. 90 tablet 3  . rivaroxaban (XARELTO) 20 MG TABS tablet Take 1 tablet (20 mg total) by mouth daily with supper. 90 tablet 3   No current facility-administered medications for this visit.     No Known Allergies    Review of Systems negative except from HPI and PMH  Physical Exam BP 122/66   Pulse 65   Ht 5\' 6"  (1.676 m)   Wt 214 lb (97.1 kg)   SpO2 98%   BMI 34.54 kg/m  Well developed and nourished in no acute distress HENT normal Neck supple with JVP-flat Clear Regular rate and rhythm, no murmurs or gallops Abd-soft with active BS No Clubbing cyanosis edema Skin-warm and dry A & Oriented  Grossly normal sensory and motor function   ECG 65 18/10/42  Assessment and  Plan Nonischemic cardiomyopathy  S ICD  The patient's device was interrogated.  The information was reviewed. No changes were made in the programming.     Ventricular  tachycardia-   Atrial fibrillation-paroxysmal  DVT  Sleep disordered breathing    Euvolemic continue current meds  On Anticoagulation;  No bleeding issues   No intercurrent Ventricular tachycardia

## 2018-05-15 NOTE — Patient Instructions (Signed)
Medication Instructions:  Your physician recommends that you continue on your current medications as directed. Please refer to the Current Medication list given to you today.  Labwork: None ordered.  Testing/Procedures: None ordered.  Follow-Up: Your physician recommends that you schedule a follow-up appointment in:  One Year with Tommye Standard, PA  Keep your follow up plan with Dr Haroldine Laws in September.  Any Other Special Instructions Will Be Listed Below (If Applicable).     If you need a refill on your cardiac medications before your next appointment, please call your pharmacy.

## 2018-05-19 NOTE — Addendum Note (Signed)
Addended by: Mady Haagensen on: 05/19/2018 07:57 AM   Modules accepted: Orders

## 2018-06-14 ENCOUNTER — Other Ambulatory Visit: Payer: Self-pay | Admitting: Cardiology

## 2018-06-30 DIAGNOSIS — I480 Paroxysmal atrial fibrillation: Secondary | ICD-10-CM | POA: Diagnosis not present

## 2018-06-30 DIAGNOSIS — Z6833 Body mass index (BMI) 33.0-33.9, adult: Secondary | ICD-10-CM | POA: Diagnosis not present

## 2018-06-30 DIAGNOSIS — I5042 Chronic combined systolic (congestive) and diastolic (congestive) heart failure: Secondary | ICD-10-CM | POA: Diagnosis not present

## 2018-06-30 DIAGNOSIS — Z7901 Long term (current) use of anticoagulants: Secondary | ICD-10-CM | POA: Diagnosis not present

## 2018-06-30 DIAGNOSIS — Z95810 Presence of automatic (implantable) cardiac defibrillator: Secondary | ICD-10-CM | POA: Diagnosis not present

## 2018-06-30 DIAGNOSIS — N183 Chronic kidney disease, stage 3 (moderate): Secondary | ICD-10-CM | POA: Diagnosis not present

## 2018-06-30 DIAGNOSIS — M1831 Unilateral post-traumatic osteoarthritis of first carpometacarpal joint, right hand: Secondary | ICD-10-CM | POA: Diagnosis not present

## 2018-08-07 ENCOUNTER — Other Ambulatory Visit: Payer: Self-pay | Admitting: Cardiology

## 2018-08-21 ENCOUNTER — Ambulatory Visit (INDEPENDENT_AMBULATORY_CARE_PROVIDER_SITE_OTHER): Payer: Medicare HMO | Admitting: *Deleted

## 2018-08-21 DIAGNOSIS — Z95810 Presence of automatic (implantable) cardiac defibrillator: Secondary | ICD-10-CM

## 2018-08-21 DIAGNOSIS — I420 Dilated cardiomyopathy: Secondary | ICD-10-CM | POA: Diagnosis not present

## 2018-08-21 DIAGNOSIS — I5022 Chronic systolic (congestive) heart failure: Secondary | ICD-10-CM

## 2018-08-21 DIAGNOSIS — I472 Ventricular tachycardia, unspecified: Secondary | ICD-10-CM

## 2018-08-21 LAB — CUP PACEART INCLINIC DEVICE CHECK
Implantable Lead Implant Date: 20150819
Implantable Lead Location: 753862
Implantable Lead Model: 3010
Implantable Pulse Generator Implant Date: 20150819
MDC IDC SESS DTM: 20191121143737
Pulse Gen Serial Number: 19497

## 2018-08-21 NOTE — Progress Notes (Signed)
Subcutaneous ICD check in clinic. 0 untreated episodes; 0 treated episodes; 0 shocks delivered. Electrode impedance status okay. No programming changes. Remaining longevity to ERI 41%. ROV with DC 11/20/18.

## 2018-08-22 ENCOUNTER — Other Ambulatory Visit: Payer: Self-pay | Admitting: Internal Medicine

## 2018-08-22 ENCOUNTER — Other Ambulatory Visit: Payer: Self-pay | Admitting: Cardiology

## 2018-10-03 ENCOUNTER — Encounter: Payer: Self-pay | Admitting: Adult Health

## 2018-10-03 ENCOUNTER — Inpatient Hospital Stay: Payer: Medicare HMO | Attending: Adult Health | Admitting: Adult Health

## 2018-10-03 VITALS — BP 107/75 | HR 75 | Temp 98.3°F | Resp 20 | Ht 66.0 in | Wt 214.4 lb

## 2018-10-03 DIAGNOSIS — Z7901 Long term (current) use of anticoagulants: Secondary | ICD-10-CM | POA: Diagnosis not present

## 2018-10-03 DIAGNOSIS — Z79899 Other long term (current) drug therapy: Secondary | ICD-10-CM | POA: Diagnosis not present

## 2018-10-03 DIAGNOSIS — Z170 Estrogen receptor positive status [ER+]: Secondary | ICD-10-CM

## 2018-10-03 DIAGNOSIS — Z853 Personal history of malignant neoplasm of breast: Secondary | ICD-10-CM | POA: Diagnosis not present

## 2018-10-03 DIAGNOSIS — Z923 Personal history of irradiation: Secondary | ICD-10-CM

## 2018-10-03 DIAGNOSIS — C50412 Malignant neoplasm of upper-outer quadrant of left female breast: Secondary | ICD-10-CM

## 2018-10-03 DIAGNOSIS — Z9221 Personal history of antineoplastic chemotherapy: Secondary | ICD-10-CM

## 2018-10-03 NOTE — Progress Notes (Signed)
CLINIC:  Survivorship   REASON FOR VISIT:  Routine follow-up for history of breast cancer.   BRIEF ONCOLOGIC HISTORY:    Breast cancer of upper-outer quadrant of left female breast ([place])   11/08/2006 Initial Diagnosis    left breast invasive ductal carcinoma with DCIS intermediate to high-grade, ER 0%, PR 0%, HER-2 positive ratio 5.3, Ki-67 54%, inflammatory breast cancer with [place], stage IIIB    12/03/2006 - 02/05/2007 Neo-Adjuvant Chemotherapy    neoadjuvant chemotherapy with AC X 4 (the entire treatment was given in [facility] by Dr. Jama Flavors    02/25/2007 Surgery    left mastectomy: IDC with DCIS 1 cm, lymphovascular invasion present, T1b N1 mic stage IB, 3/8 lymph nodes microscopic tumor less than 0.2 mm    04/02/2007 - 06/04/2007 Chemotherapy    Taxol 4 with Herceptin stopped early because of decreased ejection fraction to 15% in September 2008    06/24/2007 - 08/08/2007 Radiation Therapy    adjuvant radiation therapy treating supraclavicular and axillary areas and chest wall     Procedure    chronic heart failure under the care of Dr. Haroldine Laws, AICD ( at one point patient was evaluated for heart transplant in [facility])      INTERVAL HISTORY:  Claudia [name] presents to the [facility] today for routine follow-up for her history of breast cancer.  She is doing well today.  Since her last visit she underwent right breast 3d screening mammogram that showed no evidence of malignancy and breast density category B.    Claudia [name] is doing well.  She continues to see Dr. Haroldine Laws regularly.  She has no new medical issues since I saw her last year.  She sees her PCP regularly.  She says she is due for her pap smear.  She is up to date with other screenings.  She exercises Monday through Friday at the [facility].     REVIEW OF SYSTEMS:  Review of Systems  Constitutional: Negative for appetite change, chills, fatigue, fever and unexpected weight change.  HENT:   Negative for  hearing loss, lump/mass and trouble swallowing.   Eyes: Negative for eye problems and icterus.  Respiratory: Negative for chest tightness, cough and shortness of breath.   Cardiovascular: Negative for chest pain, leg swelling and palpitations.  Gastrointestinal: Negative for abdominal distention, abdominal pain, constipation, diarrhea, nausea and vomiting.  Endocrine: Negative for hot flashes.  Musculoskeletal: Negative for arthralgias.  Skin: Negative for itching and rash.  Neurological: Negative for dizziness, extremity weakness, headaches and numbness.  Hematological: Negative for adenopathy. Does not bruise/bleed easily.  Psychiatric/Behavioral: Negative for depression. The patient is not nervous/anxious.   Breast: Denies any new nodularity, masses, tenderness, nipple changes, or nipple discharge.       PAST MEDICAL/SURGICAL HISTORY:  Past Medical History:  Diagnosis Date  . AICD (automatic cardioverter/defibrillator) present 09/15/2014  . Breast cancer ([place])   . CHF (congestive heart failure) ([place])   . Implantable cardioverter-defibrillator infection  s/p extraction 10/07/2014   Past Surgical History:  Procedure Laterality Date  . CHOLECYSTECTOMY    . device removal     defib  . fiber tumor    . masectomy       ALLERGIES:  No Known Allergies   CURRENT MEDICATIONS:  Outpatient Encounter Medications as of 10/03/2018  Medication Sig  . acetaminophen (TYLENOL) 325 MG tablet Take 650 mg by mouth every 6 (six) hours as needed.  . carvedilol (COREG) 25 MG tablet TAKE 1  TABLET TWICE DAILY WITH MEALS  . cyclobenzaprine (FLEXERIL) 5 MG tablet TK 1 T PO QHS FOR SPASM  . eplerenone (INSPRA) 25 MG tablet Take 1 tablet (25 mg total) by mouth daily.  Marland Kitchen erythromycin ophthalmic ointment Place a 1/2 inch ribbon of ointment into the lower eyelid. Every 4 hours while awake.  Apply at bedtime  . furosemide (LASIX) 40 MG tablet TAKE 1 TABLET EVERY MORNING AND TAKE 1/2 TABLET EVERY EVENING    . hydrALAZINE (APRESOLINE) 25 MG tablet Take 37.5 mg by mouth 3 (three) times daily. Take 1 and 1/2 tablet daily by mouth  . isosorbide mononitrate (IMDUR) 30 MG 24 hr tablet Take one tablet (30 mg) by mouth at night  . lisinopril (PRINIVIL,ZESTRIL) 10 MG tablet TAKE 1 TABLET EVERY DAY  . XARELTO 20 MG TABS tablet TAKE 1 TABLET (20 MG TOTAL) BY MOUTH DAILY WITH SUPPER.   No facility-administered encounter medications on file as of 10/03/2018.      ONCOLOGIC FAMILY HISTORY:  Family History  Problem Relation Age of Onset  . Diabetes Mother   . Cancer Father     GENETIC COUNSELING/TESTING: Not indicated  SOCIAL HISTORY: (reviewed 10/03/2018) Claudia [name] is divorced and lives alone in [place], [place].  She has 1 child and she lives in [place].  One child lives in [place], and the other in [place].  Claudia [name] is currently on disability due to her cardiac issues.  She denies any current or history of tobacco, alcohol, or illicit drug use.     PHYSICAL EXAMINATION:  Vital Signs: Vitals:   10/03/18 1404  BP: 107/75  Pulse: 75  Resp: 20  Temp: 98.3 F (36.8 C)  SpO2: 100%   Filed Weights   10/03/18 1404  Weight: 214 lb 6.4 oz (97.3 kg)   General: Well-nourished, well-appearing female in no acute distress.  Unaccompanied today.   HEENT: Head is normocephalic.  Pupils equal and reactive to light. Conjunctivae clear without exudate.  Sclerae anicteric. Oral mucosa is pink, moist.  Oropharynx is pink without lesions or erythema.  Lymph: No cervical, supraclavicular, or infraclavicular lymphadenopathy noted on palpation.  Cardiovascular: Regular rate and rhythm.Marland Kitchen Respiratory: Clear to auscultation bilaterally. Chest expansion symmetric; breathing non-labored.  Breast Exam:  -Left breast: s/p mastectomy, no nodules, masses or sign of recurrence. -Right breast: No appreciable masses on palpation. No skin redness, thickening, or peau d'orange appearance; no nipple retraction  or nipple discharge;  -Axilla: No axillary adenopathy bilaterally.  GI: Abdomen soft and round; non-tender, non-distended. Bowel sounds normoactive. No hepatosplenomegaly.   GU: Deferred.  Neuro: No focal deficits. Steady gait.  Psych: Mood and affect normal and appropriate for situation.  MSK: No focal spinal tenderness to palpation, full range of motion in bilateral upper extremities Extremities: No edema. Skin: Warm and dry.  LABORATORY DATA:  None for this visit   DIAGNOSTIC IMAGING:  Most recent mammogram:  Pending scan into chart   ASSESSMENT AND PLAN:  Ms.. Shirkey is a pleasant 63 y.o. female with history of Stage IIIB left breast invasive ductal carcinoma, ER-/PR-/HER2+, diagnosed in 11/2006, treated with neoadjuvant chemotherpay, left mastectomy, adjuvant chemoetherapy with maintenance trastuzumab (stopped early due to decline in EF), adjuvant radiation therapy.  She presents to the Survivorship Clinic for surveillance and routine follow-up.   1. History of breast cancer:  Ms. Deguzman is currently clinically and radiographically without evidence of disease or recurrence of breast cancer.   She is doing well and has remained stable over  the past year, so she can transition to seeing [place] on an as needed basis.  I recommended that she see her PCP or gyn for her breast exams and to order her right breast screening mammograms.  I encouraged her to call me with any questions or concerns before her next visit at the cancer center, and I would be happy to see her sooner, if needed.    2. Treatment related heart failure: well controlled, following regularly with Dr. Haroldine Laws.  3. Bone health:  Given Ms. Pelley's age, history of breast cancer, she is at risk for bone demineralization.  She was given education on specific food and activities to promote bone health.  4. Cancer screening:  Due to Ms. Lascano's history and her age, she should receive screening for skin cancers, colon cancer, and  gynecologic cancers. She was encouraged to follow-up with her PCP for appropriate cancer screenings.   5. Health maintenance and wellness promotion: Ms. Ambroise was encouraged to consume 5-7 servings of fruits and vegetables per day. She was also encouraged to engage in moderate to vigorous exercise for 30 minutes per day most days of the week. She was instructed to limit her alcohol consumption and continue to abstain from tobacco use.    Dispo:  -Return to cancer center in one year for LTS follow up   A total of (30) minutes of face-to-face time was spent with this patient with greater than 50% of that time in counseling and care-coordination.   Gardenia Phlegm, NP Survivorship Program [facility] [phone]   Note: PRIMARY CARE PROVIDER Iona Beard, [place] [phone]

## 2018-10-06 ENCOUNTER — Telehealth: Payer: Self-pay | Admitting: Adult Health

## 2018-10-06 DIAGNOSIS — Z95810 Presence of automatic (implantable) cardiac defibrillator: Secondary | ICD-10-CM | POA: Diagnosis not present

## 2018-10-06 DIAGNOSIS — Z6834 Body mass index (BMI) 34.0-34.9, adult: Secondary | ICD-10-CM | POA: Diagnosis not present

## 2018-10-06 DIAGNOSIS — I5042 Chronic combined systolic (congestive) and diastolic (congestive) heart failure: Secondary | ICD-10-CM | POA: Diagnosis not present

## 2018-10-06 DIAGNOSIS — N183 Chronic kidney disease, stage 3 (moderate): Secondary | ICD-10-CM | POA: Diagnosis not present

## 2018-10-06 DIAGNOSIS — I480 Paroxysmal atrial fibrillation: Secondary | ICD-10-CM | POA: Diagnosis not present

## 2018-10-06 DIAGNOSIS — Z7901 Long term (current) use of anticoagulants: Secondary | ICD-10-CM | POA: Diagnosis not present

## 2018-10-06 NOTE — Telephone Encounter (Signed)
Per 1/3 no los

## 2018-11-03 DIAGNOSIS — R0981 Nasal congestion: Secondary | ICD-10-CM | POA: Diagnosis not present

## 2018-11-05 ENCOUNTER — Other Ambulatory Visit: Payer: Self-pay | Admitting: Cardiology

## 2018-11-06 NOTE — Telephone Encounter (Signed)
Pt needs appt for refills [phone] 

## 2018-11-14 ENCOUNTER — Other Ambulatory Visit: Payer: Self-pay | Admitting: Cardiology

## 2018-11-14 ENCOUNTER — Other Ambulatory Visit: Payer: Self-pay | Admitting: Internal Medicine

## 2018-11-20 ENCOUNTER — Ambulatory Visit (INDEPENDENT_AMBULATORY_CARE_PROVIDER_SITE_OTHER): Payer: Medicare HMO | Admitting: Nurse Practitioner

## 2018-11-20 DIAGNOSIS — I472 Ventricular tachycardia, unspecified: Secondary | ICD-10-CM

## 2018-11-20 LAB — CUP PACEART INCLINIC DEVICE CHECK
Implantable Lead Implant Date: 20150819
Implantable Lead Location: 753862
Implantable Lead Model: 3010
Implantable Pulse Generator Implant Date: 20150819
MDC IDC PG SERIAL: 19497
MDC IDC SESS DTM: 20200220110531

## 2018-11-20 NOTE — Progress Notes (Signed)
SICD check in clinic. Normal device function. No episodes

## 2018-12-05 DIAGNOSIS — Z1231 Encounter for screening mammogram for malignant neoplasm of breast: Secondary | ICD-10-CM | POA: Diagnosis not present

## 2018-12-05 DIAGNOSIS — Z853 Personal history of malignant neoplasm of breast: Secondary | ICD-10-CM | POA: Diagnosis not present

## 2019-01-16 ENCOUNTER — Other Ambulatory Visit: Payer: Self-pay

## 2019-01-16 NOTE — Patient Outreach (Signed)
  [facility]) Care Management Chronic Special Needs Program  01/16/2019  Name: KEYONDA BICKLE DOB: 1956/08/08  MRN: [id number]  Ms. Min Tunnell is enrolled in a chronic special needs plan for Heart Failure. Client called with no answer No answer and HIPAA compliant message left. Plan for 2nd outreach call in one week Chronic care management coordinator will attempt outreach in one week .   Peter Garter RN, Jackquline Denmark, CDE Chronic Care Management Coordinator [place] [facility] Care Management [phone]

## 2019-01-23 ENCOUNTER — Other Ambulatory Visit: Payer: Self-pay

## 2019-01-23 NOTE — Patient Outreach (Signed)
  [facility]) Care Management Chronic Special Needs Program  01/23/2019  Name: Claudia [name] DOB: Apr 10, 1956  MRN: [id number]  Claudia [name] is enrolled in a chronic special needs plan for Heart Failure. Client called with no answer No answer and HIPAA compliant message left. Plan for 3rd outreach call in one week Chronic care management coordinator will attempt outreach in one week.   Peter Garter RN, Jackquline Denmark, CDE Chronic Care Management Coordinator [facility] [facility] Care Management [phone]

## 2019-01-27 ENCOUNTER — Other Ambulatory Visit: Payer: Self-pay

## 2019-01-27 NOTE — Patient Outreach (Signed)
  [facility]) Care Management Chronic Special Needs Program  01/27/2019  Name: Claudia [name] DOB: [birth year]  MRN: [id number]  Ms. Claudia [name] is enrolled in a chronic special needs plan for Heart Failure. Chronic Care Management Coordinator telephoned client to review health risk assessment and to develop individualized care plan.  Introduced the chronic care management program, importance of client participation, and taking their care plan to all provider appointments and inpatient facilities.  Reviewed the transition of care process and possible referral to community care management.  Subjective: Client states that her heart failure has gotten better over the last few years since she has been exercising.  States she is missing going to the [facility] due to Covid 19 but she is walking at least 30 minutes now.  States she can afford food but she marked the food question because she did not want to go out to buy food.  States that she now goes to shop during the special hours for Seniors and she wears her mask and gloves.  States she weights daily but she does not always write it down as her weight has been very stable.    Goals Addressed            This Visit's Progress   . Advanced Care Planning complete by next 9 months       [facility] [employer] worker will contact you about Financial controller     . Client understands the importance of follow-up with providers by attending scheduled visits      . Client will report weighing and recording weights daily within the next 6 months      . Decrease inpatient Heart Failure admissions/ readmissions with in the year      . Decrease the use of hospital emergency department related to heart failure within the next year      . Maintain timely refills of Heart Failure medication as prescribed within the year       . Obtain annual  Lipid Profile, LDL-C      . Visit Primary Care Provider or Cardiologist at least 2 times per year        Client verbalizes good teach back on s/s to call MD for her HF. Reports following low sodium diet and exercising daily.  Client agreeable to referral to pharmacy for >8 medications and questions about mail order.  Agreeable to referral to Social work for [place] cause, symptoms, precautions (social distancing, stay at home order, hand washing), confirmed client knows how to contact provider.  Plan:  Send successful outreach letter with a copy of their individualized care plan, Send individual care plan to provider and Send educational material-HF  Chronic care management coordination will outreach in:  6 Months  Will refer client to:  Social Work and [place] RN, [place], [place] Management [phone]

## 2019-01-28 ENCOUNTER — Other Ambulatory Visit: Payer: Self-pay | Admitting: Pharmacist

## 2019-01-28 ENCOUNTER — Other Ambulatory Visit: Payer: Self-pay

## 2019-01-28 ENCOUNTER — Other Ambulatory Visit (HOSPITAL_COMMUNITY): Payer: Self-pay | Admitting: *Deleted

## 2019-01-28 MED ORDER — RIVAROXABAN 20 MG PO TABS: ORAL_TABLET | ORAL | 3 refills | Status: DC

## 2019-01-28 MED ORDER — CARVEDILOL 25 MG PO TABS: 25.0000 mg | ORAL_TABLET | Freq: Two times a day (BID) | ORAL | 3 refills | Status: DC

## 2019-01-28 MED ORDER — ISOSORBIDE MONONITRATE ER 30 MG PO TB24: ORAL_TABLET | ORAL | 3 refills | Status: DC

## 2019-01-28 MED ORDER — FUROSEMIDE 40 MG PO TABS
ORAL_TABLET | ORAL | 3 refills | Status: DC
Start: 2019-01-28 — End: 2019-07-23

## 2019-01-28 MED ORDER — HYDRALAZINE HCL 25 MG PO TABS: 37.5000 mg | ORAL_TABLET | Freq: Three times a day (TID) | ORAL | 3 refills | Status: DC

## 2019-01-28 MED ORDER — LISINOPRIL 10 MG PO TABS: 10.0000 mg | ORAL_TABLET | Freq: Every day | ORAL | 3 refills | Status: DC

## 2019-01-28 MED ORDER — EPLERENONE 25 MG PO TABS: 25.0000 mg | ORAL_TABLET | Freq: Every day | ORAL | 3 refills | Status: DC

## 2019-01-28 NOTE — Patient Outreach (Signed)
[facility]) Care Management  01/28/2019  Claudia [name] Sep 16, 1956 [id number]  Successful outreach to patient regarding social work referral for food insecurity and assistance with Advance Directives.   Patient reports that she is able to afford food but is concerned about being in public due to [id number].  BSW informed her of [employer] which allows patient to receive seven prepared meals each week.  Patient agreed to referral. Referral was submitted and patient will receive first delivery on Friday, 02/06/19. BSW and patient discussed HC POA and Living Will.  BSW will mail Advance Directive EMMI and packet. Will follow up with patient within the next two weeks to ensure receipt and review further.  Ronn Melena, BSW Social Worker [phone]

## 2019-01-28 NOTE — Patient Outreach (Signed)
[facility]) Care Management  [place]   01/28/2019  Claudia [name] 1956-05-22 [id number]  Reason for referral: Medication Review, Medication Management  Referral source: [employer] Care Manager with [facility] Current insurance: [employer]  Outreach:  Unsuccessful telephone call attempt #1 to patient. HIPAA compliant voicemail left requesting a return call  Plan:  -I will mail patient an unsuccessful outreach letter.  -I will make another outreach attempt to patient within 3-4 business days.    Ralene Bathe, PharmD, [place] [phone]

## 2019-01-29 MED ORDER — HYDRALAZINE HCL 25 MG PO TABS: 37.5000 mg | ORAL_TABLET | Freq: Three times a day (TID) | ORAL | 3 refills | Status: DC

## 2019-01-29 NOTE — Addendum Note (Signed)
Addended by: Valeda Malm on: 01/29/2019 09:49 AM   Modules accepted: Orders

## 2019-01-29 NOTE — Telephone Encounter (Signed)
Received call from [facility] re: hydralazine. Prescription had 2 sets of direction for 25mg  1.5 tabs TID and 1.5 tabs daily.  Order clarified for TID and 90 day supply adjusted up to 405 tabs.

## 2019-02-05 DIAGNOSIS — Z20828 Contact with and (suspected) exposure to other viral communicable diseases: Secondary | ICD-10-CM | POA: Diagnosis not present

## 2019-02-09 ENCOUNTER — Other Ambulatory Visit: Payer: Self-pay

## 2019-02-09 NOTE — Patient Outreach (Signed)
[facility]) Care Management  02/09/2019  Claudia [name] 03-Sep-1956 [id number]   Successful follow up call to patient today.  Patient is receiving delivered meals through [facility] and reported being pleased with program.  Patient has not received Advance Directive EMMI and Packet that were mailed on 01/28/19 so BSW is mailing again today. BSW will follow up within the next two weeks to ensure receipt.  Ronn Melena, BSW Social Worker [phone]

## 2019-02-10 ENCOUNTER — Ambulatory Visit: Payer: Self-pay | Admitting: Pharmacist

## 2019-02-10 ENCOUNTER — Other Ambulatory Visit: Payer: Self-pay | Admitting: Pharmacist

## 2019-02-10 NOTE — Patient Outreach (Signed)
[facility]) Care Management  [place]   02/10/2019  BERT GIVANS 09-Jun-1956 [id number]  Reason for referral: Medication Review  Referral source: [employer] C-SNP Care Manager with [facility] Current insurance: Health Team [employer] C-SNP  PMHx includes but not limited to:  NICM with AICD, heart failure (EF 45% 3/17) , HTN, PAF, hx DVT, mitral and tricuspid regurgitation, hx breast cancer  Outreach:  Successful telephone call with Ms. Langlinais.  HIPAA identifiers verified.   Subjective:  Patient reports she manages her own medications and feels that they are all working well for her.  She denies having any questions or concerns about her medications.  She states she has no issues paying for medications and uses mail order pharmacy [facility]) for most medications. Patient agreeable to review medications.   Objective: Lab Results  Component Value Date   CREATININE 1.14 (H) 12/04/2017   CREATININE 1.26 (H) 12/16/2015   CREATININE 1.18 (H) 12/15/2015    No results found for: HGBA1C  Lipid Panel  No results found for: CHOL, TRIG, HDL, CHOLHDL, VLDL, LDLCALC, LDLDIRECT  BP Readings from Last 3 Encounters:  10/03/18 107/75  05/15/18 122/66  02/16/18 (!) 148/79    No Known Allergies  Medications Reviewed Today    Reviewed by Gardenia Phlegm, NP (Nurse Practitioner) on 10/03/18 at 1412  Med List Status: <None>  Medication Order Taking? Sig Documenting Provider Last Dose Status Informant  acetaminophen (TYLENOL) 325 MG tablet [id number] Yes Take 650 mg by mouth every 6 (six) hours as needed. [provider] Taking Active Self  carvedilol (COREG) 25 MG tablet [id number] Yes TAKE 1 TABLET TWICE DAILY WITH MEALS Larey Dresser, MD Taking Active   cyclobenzaprine (FLEXERIL) 5 MG tablet [id number] Yes TK 1 T PO QHS FOR SPASM [provider] Taking Active   eplerenone (INSPRA) 25 MG tablet [id number] Yes Take 1 tablet (25 mg total)  by mouth daily. Larey Dresser, MD Taking Active   erythromycin ophthalmic ointment [id number] Yes Place a 1/2 inch ribbon of ointment into the lower eyelid. Every 4 hours while awake.  Apply at bedtime Tereasa Coop, PA-C Taking Active   furosemide (LASIX) 40 MG tablet [id number] Yes TAKE 1 TABLET EVERY MORNING AND TAKE 1/2 TABLET EVERY EVENING Larey Dresser, MD Taking Active   hydrALAZINE (APRESOLINE) 25 MG tablet [id number] Yes Take 37.5 mg by mouth 3 (three) times daily. Take 1 and 1/2 tablet daily by mouth [provider] Taking Active Self  isosorbide mononitrate (IMDUR) 30 MG 24 hr tablet [id number] Yes Take one tablet (30 mg) by mouth at night Bensimhon, Shaune Pascal, MD Taking Active   lisinopril (PRINIVIL,ZESTRIL) 10 MG tablet [id number] Yes TAKE 1 TABLET EVERY DAY Bensimhon, Shaune Pascal, MD Taking Active   XARELTO 20 MG TABS tablet [id number] Yes TAKE 1 TABLET (20 MG TOTAL) BY MOUTH DAILY WITH SUPPER. Deboraha Sprang, MD Taking Active           Assessment:  Drugs sorted by system:  Hematologic: rivaroxaban  Cardiovascular: carvedilol, eplerenone, furosemide, hydralazine, isosorbide mononitrate, lisinopril  Pain: acetaminophen, cyclobenzaprine  Medication Review Findings:  . Taking hydralazine 25mg  (1 tablet) TID rather than 37.5mg  (1.5 tablets) TID per medication list.   o Reviewed current instructions with patient who states she has never taken more than 1 tablet at a time of hydralazine.  She states she monitors her BP daily and it is never > 120/80.  She will discuss dose discrepancy with  cardiology at next visit.    Medication Assistance Findings:  No medication assistance needs identified  Plan: . Will route note to cardiologist.  . Will close [place] case as no further medication needs identified at this time.  Am happy to assist in the future as needed.     Ralene Bathe, PharmD, [facility] [place] [phone]

## 2019-02-16 ENCOUNTER — Other Ambulatory Visit: Payer: Self-pay

## 2019-02-16 NOTE — Patient Outreach (Signed)
[facility]) Care Management  02/16/2019  Claudia [name] Mar 14, 1956 [id number]   Patient linked to [employer] for meal delivery.  No other social work needs identified.  BSW closing case.  Ronn Melena, BSW Social Worker [phone]

## 2019-02-19 ENCOUNTER — Other Ambulatory Visit: Payer: Self-pay

## 2019-02-19 NOTE — Patient Outreach (Signed)
[facility]) Care Management  02/19/2019  Claudia [name] September 15, 1956 [id number]   BSW mistakenly removed self from care team prior to ensuring receipt of Advance Directive EMMI and packet that were mailed to patient on 02/09/19.  BSW talked with patient today and she did ensure receipt.  BSW answered questions that she had about completion of documents.  BSW encouraged her to call if additional questions arise.   Ronn Melena, BSW Social Worker [phone]

## 2019-04-30 DIAGNOSIS — H25813 Combined forms of age-related cataract, bilateral: Secondary | ICD-10-CM | POA: Diagnosis not present

## 2019-04-30 DIAGNOSIS — H52203 Unspecified astigmatism, bilateral: Secondary | ICD-10-CM | POA: Diagnosis not present

## 2019-04-30 DIAGNOSIS — H5213 Myopia, bilateral: Secondary | ICD-10-CM | POA: Diagnosis not present

## 2019-04-30 DIAGNOSIS — H524 Presbyopia: Secondary | ICD-10-CM | POA: Diagnosis not present

## 2019-05-23 NOTE — Progress Notes (Signed)
Cardiology Office Note Date:  05/23/2019  Patient ID:  Claudia [name], Getting [birth year], MRN [id number] PCP:  Iona Beard, MD  Cardiologist:  Dr. Haroldine Laws Electrophysiologist: Dr. Caryl Comes   Chief Complaint:  Device check visit  History of Present Illness: Claudia [name] is a 63 y.o. female with history of NICM, chronic CHF (systolic), breast cancer threated with chemi resulting in her CM, PAFib and DVT hx.   Records note: She developed breast cancer in 2008 and underwent chemotherapy and in the wake of this, she developed congestive heart failure with EF 20%. Cardiac cath with normal coronaries. She received an ICD It became infected in the spring of 2015 with staph and she underwent extraction. She has undergone S ICD implantation following prolonged use of a LifeVest. She was ultimately transferred to [facility] in [place] in 01/2014 for consideration of heart transplantation. They diuresed her over 60 pounds and she improved markedly, and here f/u with the CHF team.  She comes in today to be seen for Dr. Caryl Comes.  Last seen by him Aug 2019.  At that time doing well, no changes were made to her device or medicines.  She is doing well.  [facility], she is now walking in the park in the mornings, no changes to her exertional capacity, sometimes hip pain slows her.  No CP, palpitations.  No rest SOB, she sleeps with 2 pillows for as long as she can remember, denies symptoms of orthopnea or PND.  No near syncope or syncope, no shocks.  She denies bleeding, signs of bleeding with her Xarelto   DEVICE history: BSCi single chamber S-ICD, implanted 05/19/14, Dr. Caryl Comes Device was implanted following removal of a transvenous device secondary to infection Shocked x3 in March 2017 for slow VT with undersensing to start, and oversensing T waves resulting in shock therapy as otherwise would have been below the detection zone, changed detection to 250 so as to decrease the  likelihood that her VT and overcounting (BB was increased as well)   Past Medical History:  Diagnosis Date  . AICD (automatic cardioverter/defibrillator) present 09/15/2014  . Breast cancer ([place])   . CHF (congestive heart failure) ([place])   . 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 CHOLECYSTECTOMY    . device removal     defib  . fiber tumor    . masectomy      Current Outpatient Medications  Medication Sig Dispense Refill  . acetaminophen (TYLENOL) 325 MG tablet Take 650 mg by mouth every 6 (six) hours as needed.    . carvedilol (COREG) 25 MG tablet Take 1 tablet (25 mg total) by mouth 2 (two) times daily with a meal. 180 tablet 3  . cyclobenzaprine (FLEXERIL) 5 MG tablet Take 5 mg by mouth at bedtime.   2  . eplerenone (INSPRA) 25 MG tablet Take 1 tablet (25 mg total) by mouth daily. 90 tablet 3  . furosemide (LASIX) 40 MG tablet TAKE 1 TABLET EVERY MORNING AND TAKE 1/2 TABLET EVERY EVENING 135 tablet 3  . hydrALAZINE (APRESOLINE) 25 MG tablet Take 1.5 tablets (37.5 mg total) by mouth 3 (three) times daily. 405 tablet 3  . isosorbide mononitrate (IMDUR) 30 MG 24 hr tablet Take one tablet (30 mg) by mouth at night 90 tablet 3  . lisinopril (ZESTRIL) 10 MG tablet Take 1 tablet (10 mg total) by mouth  daily. 90 tablet 3  . rivaroxaban (XARELTO) 20 MG TABS tablet TAKE 1 TABLET EVERY DAY WITH SUPPER 90 tablet 3   No current facility-administered medications for this visit.     Allergies:   Patient has no known allergies.   Social History:  The patient  reports that she has quit smoking. She has never used smokeless tobacco. She reports that she does not drink alcohol or use drugs.   Family History:  The patient's family history includes Cancer in her father; Diabetes in her mother.  ROS:  Please see the history of present illness. All other systems are reviewed and otherwise negative.    PHYSICAL EXAM:  VS:  There were no vitals taken for this visit. BMI: There is no height or weight on file to calculate BMI. Well nourished, well developed, in no acute distress  HEENT: normocephalic, atraumatic  Neck: no JVD, carotid bruits or masses Cardiac: RRR; no significant murmurs, no rubs, or gallops Lungs:  CTA b/l, no wheezing, rhonchi or rales  Abd: soft, nontender MS: no deformity or atrophy Ext: no edema  Skin: warm and dry, no rash Neuro:  No gross deficits appreciated Psych: euthymic mood, full affect  S-ICD site is stable, no tethering or discomfort   EKG:  Done today and reviewed by myself SR 66bpm, normal intervals.  T changes appear unchanged from prior ICD interrogation today and reviewed by myself: battery status 21%, lead/electrode impedance is OK.  No episodes  ECHO 11/2015: EF 45% Grade I DD ECHO 10/07/14 and EF 40-45% with normal RV.  Recent Labs: No results found for requested labs within last 8760 hours.  No results found for requested labs within last 8760 hours.   CrCl cannot be calculated (Patient's most recent lab result is older than the maximum 21 days allowed.).   Wt Readings from Last 3 Encounters:  10/03/18 214 lb 6.4 oz (97.3 kg)  05/15/18 214 lb (97.1 kg)  12/04/17 211 lb 12.8 oz (96.1 kg)     Other studies reviewed: Additional studies/records reviewed today include: summarized above  ASSESSMENT AND PLAN:  1. NICM w/ICD     normal device f/u, no changes     In d/w BSCi rep, battery life approx 1-1.5 years  2. Chronic CHF (systolic)     Last echo with EF 45%     exam is euvolemnic     She is overdue to see AHF team,. She will call to make an appointment     will update her echo   3. PAFib (and hx of DVT)     CHA2DS2Vasc is at least 2, on Xarelto, appropriately dosed by last available labs     Update labs today    Disposition: Device clinic in 3 months, Dr. Belva Chimes is 6mo.  She is over due to see AHF team.  They had wanted to  update her echo at her last visit.  Will get that done and she will call them to schedule a visit.  BMET, CBC today given her xarelto.     Current medicines are reviewed at length with the patient today.  The patient did not have any concerns regarding medicines.  Haywood Lasso, PA-C 05/23/2019 5:41 PM     [place] [place] [address] [phone] (office)  [phone] (fax)

## 2019-05-25 ENCOUNTER — Ambulatory Visit (INDEPENDENT_AMBULATORY_CARE_PROVIDER_SITE_OTHER): Payer: HMO | Admitting: Physician Assistant

## 2019-05-25 ENCOUNTER — Encounter (INDEPENDENT_AMBULATORY_CARE_PROVIDER_SITE_OTHER): Payer: Self-pay

## 2019-05-25 ENCOUNTER — Other Ambulatory Visit: Payer: Self-pay

## 2019-05-25 ENCOUNTER — Encounter: Payer: Self-pay | Admitting: Physician Assistant

## 2019-05-25 VITALS — BP 124/68 | HR 66 | Ht 66.0 in | Wt 213.0 lb

## 2019-05-25 DIAGNOSIS — I480 Paroxysmal atrial fibrillation: Secondary | ICD-10-CM | POA: Diagnosis not present

## 2019-05-25 DIAGNOSIS — I5022 Chronic systolic (congestive) heart failure: Secondary | ICD-10-CM

## 2019-05-25 DIAGNOSIS — Z95810 Presence of automatic (implantable) cardiac defibrillator: Secondary | ICD-10-CM | POA: Diagnosis not present

## 2019-05-25 DIAGNOSIS — I420 Dilated cardiomyopathy: Secondary | ICD-10-CM | POA: Diagnosis not present

## 2019-05-25 NOTE — Patient Instructions (Addendum)
Medication Instructions:  Your physician recommends that you continue on your current medications as directed. Please refer to the Current Medication list given to you today.  If you need a refill on your cardiac medications before your next appointment, please call your pharmacy.   Lab work:   BMET AND CBC TODAY   If you have labs (blood work) drawn today and your tests are completely normal, you will receive your results only by: Marland Kitchen MyChart Message (if you have MyChart) OR . A paper copy in the mail If you have any lab test that is abnormal or we need to change your treatment, we will call you to review the results.  Testing/Procedures: Your physician has requested that you have an echocardiogram. Echocardiography is a painless test that uses sound waves to create images of your heart. It provides your doctor with information about the size and shape of your heart and how well your heart's chambers and valves are working. This procedure takes approximately one hour. There are no restrictions for this procedure.  Follow-Up:  WITH DEVICE CLINIC IN 3 MONTHS FOR DEFIB CHECK   At [facility], you and your health needs are our priority.  As part of our continuing mission to provide you with exceptional heart care, we have created designated Provider Care Teams.  These Care Teams include your primary Cardiologist (physician) and Advanced Practice Providers (APPs -  Physician Assistants and Nurse Practitioners) who all work together to provide you with the care you need, when you need it. You will need a follow up appointment in 6 months. Please call our office 2 months in advance to schedule this appointment.  You may see Dr. Caryl Comes  or one of the following Advanced Practice Providers on your designated Care Team:   Chanetta Marshall, NP . Tommye Standard, PA-C  Any Other Special Instructions Will Be Listed Below (If Applicable).

## 2019-05-26 DIAGNOSIS — N183 Chronic kidney disease, stage 3 (moderate): Secondary | ICD-10-CM | POA: Diagnosis not present

## 2019-05-26 DIAGNOSIS — M25552 Pain in left hip: Secondary | ICD-10-CM | POA: Diagnosis not present

## 2019-05-26 DIAGNOSIS — Z95810 Presence of automatic (implantable) cardiac defibrillator: Secondary | ICD-10-CM | POA: Diagnosis not present

## 2019-05-26 DIAGNOSIS — I5040 Unspecified combined systolic (congestive) and diastolic (congestive) heart failure: Secondary | ICD-10-CM | POA: Diagnosis not present

## 2019-05-26 DIAGNOSIS — Z7901 Long term (current) use of anticoagulants: Secondary | ICD-10-CM | POA: Diagnosis not present

## 2019-05-26 LAB — BASIC METABOLIC PANEL
BUN/Creatinine Ratio: 12 (ref 12–28)
BUN: 16 mg/dL (ref 8–27)
CO2: 25 mmol/L (ref 20–29)
Calcium: 9.7 mg/dL (ref 8.7–10.3)
Chloride: 98 mmol/L (ref 96–106)
Creatinine, Ser: 1.3 mg/dL — ABNORMAL HIGH (ref 0.57–1.00)
GFR calc Af Amer: 50 mL/min/{1.73_m2} — ABNORMAL LOW (ref >59)
GFR calc non Af Amer: 44 mL/min/{1.73_m2} — ABNORMAL LOW (ref >59)
Glucose: 100 mg/dL — ABNORMAL HIGH (ref 65–99)
Potassium: 4.2 mmol/L (ref 3.5–5.2)
Sodium: 139 mmol/L (ref 134–144)

## 2019-05-26 LAB — CBC
Hematocrit: 38.1 % (ref 34.0–46.6)
Hemoglobin: 12.4 g/dL (ref 11.1–15.9)
MCH: 27.1 pg (ref 26.6–33.0)
MCHC: 32.5 g/dL (ref 31.5–35.7)
MCV: 83 fL (ref 79–97)
Platelets: 326 10*3/uL (ref 150–450)
RBC: 4.57 x10E6/uL (ref 3.77–5.28)
RDW: 13.4 % (ref 11.7–15.4)
WBC: 4.8 10*3/uL (ref 3.4–10.8)

## 2019-05-29 ENCOUNTER — Telehealth: Payer: Self-pay | Admitting: Internal Medicine

## 2019-05-29 NOTE — Telephone Encounter (Signed)
New message:    Patient returning call concering lab results. Please call patient back.

## 2019-06-01 ENCOUNTER — Other Ambulatory Visit: Payer: Self-pay

## 2019-06-01 ENCOUNTER — Ambulatory Visit (HOSPITAL_COMMUNITY): Payer: HMO | Attending: Cardiovascular Disease

## 2019-06-01 DIAGNOSIS — I420 Dilated cardiomyopathy: Secondary | ICD-10-CM | POA: Insufficient documentation

## 2019-06-01 NOTE — Telephone Encounter (Signed)
Please call patient with lab results

## 2019-06-02 ENCOUNTER — Other Ambulatory Visit: Payer: Self-pay | Admitting: *Deleted

## 2019-06-02 DIAGNOSIS — I420 Dilated cardiomyopathy: Secondary | ICD-10-CM

## 2019-06-22 ENCOUNTER — Other Ambulatory Visit: Payer: HMO | Admitting: *Deleted

## 2019-06-22 ENCOUNTER — Other Ambulatory Visit: Payer: Self-pay

## 2019-06-22 DIAGNOSIS — I420 Dilated cardiomyopathy: Secondary | ICD-10-CM

## 2019-06-22 LAB — BASIC METABOLIC PANEL
BUN/Creatinine Ratio: 16 (ref 12–28)
BUN: 19 mg/dL (ref 8–27)
CO2: 25 mmol/L (ref 20–29)
Calcium: 9.6 mg/dL (ref 8.7–10.3)
Chloride: 98 mmol/L (ref 96–106)
Creatinine, Ser: 1.17 mg/dL — ABNORMAL HIGH (ref 0.57–1.00)
GFR calc Af Amer: 57 mL/min/{1.73_m2} — ABNORMAL LOW (ref >59)
GFR calc non Af Amer: 50 mL/min/{1.73_m2} — ABNORMAL LOW (ref >59)
Glucose: 125 mg/dL — ABNORMAL HIGH (ref 65–99)
Potassium: 4.1 mmol/L (ref 3.5–5.2)
Sodium: 138 mmol/L (ref 134–144)

## 2019-06-30 ENCOUNTER — Other Ambulatory Visit: Payer: Self-pay

## 2019-06-30 NOTE — Patient Outreach (Signed)
  [facility]) Care Management Chronic Special Needs Program  06/30/2019  Name: Claudia [name] DOB: [birth year]  MRN: [id number]  Ms. Claudia [name] is enrolled in a chronic special needs plan for Heart Failure. Client called with no answer No answer and HIPAA compliant message left. 1st attempt Plan for 2nd outreach call in one [name] Chronic care management coordinator will attempt outreach in one [name].   Claudia Garter RN, Claudia [name], CDE Chronic Care Management Coordinator [facility] Care Management [phone]

## 2019-07-03 ENCOUNTER — Other Ambulatory Visit: Payer: Self-pay

## 2019-07-03 NOTE — Patient Outreach (Signed)
  [facility]) Care Management Chronic Special Needs Program  07/03/2019  Name: Claudia [name] DOB: 1955-11-22  MRN: [id number]  Ms. [name] is enrolled in a chronic special needs plan for Heart Failure. Reviewed and updated care plan.  Subjective: Client states she has been feeling good.  Denies any SOB, swelling or chest pains.  States she has bee trying to exercise at home since she does not feel safe to go to the [facility] now.  States she follows a low salt diet and she is weighing daily.  States her weights do not vary very much.  States she is to go to the heart failure clinic in a few weeks for a check up.  Goals Addressed            This Visit's Progress   . Advanced Care Planning complete by next 9 months(continue 07/03/19)   On track   . Client understands the importance of follow-up with providers by attending scheduled visits   On track   . Client will report weighing and recording weights daily within the next 6 months(continue 07/03/19)   On track   . Decrease inpatient Heart Failure admissions/ readmissions with in the year   On track   . Decrease the use of hospital emergency department related to heart failure within the next year   On track   . Maintain timely refills of Heart Failure medication as prescribed within the year    On track   . Obtain annual  Lipid Profile, LDL-C   On track   . Visit Primary Care Provider or Cardiologist at least 2 times per year   On track    Client reports following a low sodium diet and doing daily wts.  She verbalizes s/s to call MD.  Client completed referrals with Social work and has forms for Advanced Directives but has not completed yet.  Pharmacy completed medication review Reviewed s/s of HF to call MD Reinforced to continue exercise as tolerated and to follow a low sodium diet Encouraged to complete her Advanced Directives Reviewed number for 24-hour nurse Line Reviewed COVID 19 precautions  Plan:  Send successful  outreach letter with a copy of their individualized care plan and Send individual care plan to provider  Chronic care management coordinator will outreach in:  4-6 Months     [place], [facility], [place] Management Coordinator [place] Management [phone]

## 2019-07-23 ENCOUNTER — Other Ambulatory Visit: Payer: Self-pay

## 2019-07-23 ENCOUNTER — Encounter (HOSPITAL_COMMUNITY): Payer: Self-pay | Admitting: Internal Medicine

## 2019-07-23 ENCOUNTER — Ambulatory Visit (HOSPITAL_COMMUNITY)
Admission: RE | Admit: 2019-07-23 | Discharge: 2019-07-23 | Disposition: A | Discharge status: Home / Self Care | Payer: HMO | Source: Ambulatory Visit | Attending: Internal Medicine | Admitting: Internal Medicine

## 2019-07-23 VITALS — BP 134/90 | HR 74 | Wt 212.4 lb

## 2019-07-23 DIAGNOSIS — Z7901 Long term (current) use of anticoagulants: Secondary | ICD-10-CM | POA: Insufficient documentation

## 2019-07-23 DIAGNOSIS — Z95810 Presence of automatic (implantable) cardiac defibrillator: Secondary | ICD-10-CM | POA: Insufficient documentation

## 2019-07-23 DIAGNOSIS — I10 Essential (primary) hypertension: Secondary | ICD-10-CM

## 2019-07-23 DIAGNOSIS — E785 Hyperlipidemia, unspecified: Secondary | ICD-10-CM | POA: Diagnosis not present

## 2019-07-23 DIAGNOSIS — Z79899 Other long term (current) drug therapy: Secondary | ICD-10-CM | POA: Insufficient documentation

## 2019-07-23 DIAGNOSIS — Z87891 Personal history of nicotine dependence: Secondary | ICD-10-CM | POA: Diagnosis not present

## 2019-07-23 DIAGNOSIS — I480 Paroxysmal atrial fibrillation: Secondary | ICD-10-CM | POA: Diagnosis not present

## 2019-07-23 DIAGNOSIS — Z86718 Personal history of other venous thrombosis and embolism: Secondary | ICD-10-CM | POA: Insufficient documentation

## 2019-07-23 DIAGNOSIS — I472 Ventricular tachycardia, unspecified: Secondary | ICD-10-CM

## 2019-07-23 DIAGNOSIS — I110 Hypertensive heart disease with heart failure: Secondary | ICD-10-CM | POA: Insufficient documentation

## 2019-07-23 DIAGNOSIS — Z853 Personal history of malignant neoplasm of breast: Secondary | ICD-10-CM | POA: Insufficient documentation

## 2019-07-23 DIAGNOSIS — I5022 Chronic systolic (congestive) heart failure: Secondary | ICD-10-CM | POA: Diagnosis not present

## 2019-07-23 LAB — BASIC METABOLIC PANEL
Anion gap: 14 (ref 5–15)
BUN: 15 mg/dL (ref 8–23)
CO2: 22 mmol/L (ref 22–32)
Calcium: 10.8 mg/dL — ABNORMAL HIGH (ref 8.9–10.3)
Chloride: 99 mmol/L (ref 98–111)
Creatinine, Ser: 1.05 mg/dL — ABNORMAL HIGH (ref 0.44–1.00)
GFR calc Af Amer: >60 mL/min (ref >60)
GFR calc non Af Amer: 56 mL/min — ABNORMAL LOW (ref >60)
Glucose, Bld: 103 mg/dL — ABNORMAL HIGH (ref 70–99)
Potassium: 4.1 mmol/L (ref 3.5–5.1)
Sodium: 135 mmol/L (ref 135–145)

## 2019-07-23 LAB — BRAIN NATRIURETIC PEPTIDE: B Natriuretic Peptide: 55.6 pg/mL (ref 0.0–100.0)

## 2019-07-23 MED ORDER — FUROSEMIDE 40 MG PO TABS: 40.0000 mg | ORAL_TABLET | Freq: Every day | ORAL | 5 refills | Status: DC

## 2019-07-23 MED ORDER — ENTRESTO 24-26 MG PO TABS: 1.0000 | ORAL_TABLET | Freq: Two times a day (BID) | ORAL | 5 refills | Status: DC

## 2019-07-23 NOTE — Progress Notes (Signed)
Patient ID: Claudia [name], female   DOB: 09/25/1956, 63 y.o.   MRN: [id number]  Primary Cardiologist: Dr Claudia [name] PCP: Dr Claudia [name]  Oncology: Dr Claudia [name].  EP: Dr Claudia [name]  HPI: Ms. Claudia [name] is a 63 y/o woman with h/o breast cancer in [id number] and systolic HF due to chemotoxicity. She also h/o PAF and previous DVT for which she is on chronic anticoagulation. She moved from [place] in 07/2014 and saw Dr. Johnsie [name].   She developed breast cancer in 2008 and underwent chemotherapy and in the wake of this, she developed congestive heart failure with EF 20%. Cardiac cath with normal coronaries. She received an ICD It became infected in the spring of 2015 with staph and she underwent extraction. She has undergone S ICD implantation following prolonged use of a LifeVest. She was ultimately transferred to [facility] in [place] in 01/2014 for consideration of heart transplantation. They diuresed her over 60 pounds and she improved markedly.   March 2017 after  ICD fired x3. Device was reprogrammed and bb was increased 18.75 mg twice a day. ECHO was repeated EF 45%. Dischaege weight 184 pounds.   Echo 8/20 EF 35-40%  Today she returns for HF follow up we have not seen her in almost 1.5 years. Says she feels very good. Walking for an hour at a time and exercising in the house. No SOB, CP, orthopnea or PND. BP runs 120-130/70. Compliant with all meds    ECHO 11/2015: EF 45% Grade I DD ECHO 10/07/14 and EF 40-45% with normal RV.   Labs: 08/24/14: Potassium 4.2 Creatinine 1.07 hgb 13.1 01/31/2015: K 4.0 Creatinine 1.28   ROS: All systems negative except as listed in HPI, PMH and Problem List.  SH:  Social History   Socioeconomic History   Marital status: Divorced    Spouse name: Not on file   Number of children: Not on file   Years of education: Not on file   Highest education level: Not on file  Occupational History   Not on file  Social Needs   Financial resource strain: Not on file   Food insecurity    Worry:  Never true    Inability: Never true   Transportation needs    Medical: No    Non-medical: No  Tobacco Use   Smoking status: Former Smoker   Smokeless tobacco: Never Used  Substance and Sexual Activity   Alcohol use: No   Drug use: No   Sexual activity: Not on file  Lifestyle   Physical activity    Days per week: Not on file    Minutes per session: Not on file   Stress: Not on file  Relationships   Social connections    Talks on phone: Not on file    Gets together: Not on file    Attends religious service: Not on file    Active member of club or organization: Not on file    Attends meetings of clubs or organizations: Not on file    Relationship status: Not on file   Intimate partner violence    Fear of current or ex partner: Not on file    Emotionally abused: Not on file    Physically abused: Not on file    Forced sexual activity: Not on file  Other Topics Concern   Not on file  Social History Narrative   Not on file    FH:  Family History  Problem Relation Age of Onset   Diabetes Mother  Cancer Father     Past Medical History:  Diagnosis Date   AICD (automatic cardioverter/defibrillator) present 09/15/2014   Breast cancer (HCC)    CHF (congestive heart failure) (H[facility])    Implantable cardioverter-defibrillator infection  s/p extraction 10/07/2014    Current Outpatient Medications  Medication Sig Dispense Refill   acetaminophen (TYLENOL) 325 MG tablet Take 650 mg by mouth every 6 (six) hours as needed.     carvedilol (COREG) 25 MG tablet Take 1 tablet (25 mg total) by mouth 2 (two) times daily with a meal. 180 tablet 3   cyclobenzaprine (FLEXERIL) 5 MG tablet Take 5 mg by mouth at bedtime.   2   eplerenone (INSPRA) 25 MG tablet Take 12.5 mg by mouth daily.     furosemide (LASIX) 40 MG tablet TAKE 1 TABLET EVERY MORNING AND TAKE 1/2 TABLET EVERY EVENING 135 tablet 3   hydrALAZINE (APRESOLINE) 25 MG tablet Take 1.5 tablets (37.5 mg total)  by mouth 3 (three) times daily. 405 tablet 3   isosorbide mononitrate (IMDUR) 30 MG 24 hr tablet Take one tablet (30 mg) by mouth at night 90 tablet 3   lisinopril (ZESTRIL) 10 MG tablet Take 1 tablet (10 mg total) by mouth daily. 90 tablet 3   rivaroxaban (XARELTO) 20 MG TABS tablet TAKE 1 TABLET EVERY DAY WITH SUPPER 90 tablet 3   No current facility-administered medications for this encounter.     Vitals:   07/23/19 1220  BP: 134/90  Pulse: 74  SpO2: 98%  Weight: 96.3 kg (212 lb 6.4 oz)    PHYSICAL EXAM:  General:  Well appearing. No resp difficulty HEENT: normal Neck: supple. no JVD. Carotids 2+ bilat; no bruits. No lymphadenopathy or thryomegaly appreciated. Cor: PMI nondisplaced. Regular rate & rhythm. No rubs, gallops or murmurs. Lungs: clear Abdomen: obese soft, nontender, nondistended. No hepatosplenomegaly. No bruits or masses. Good bowel sounds. Extremities: no cyanosis, clubbing, rash, edema Neuro: alert & orientedx3, cranial nerves grossly intact. moves all 4 extremities w/o difficulty. Affect pleasant    ASSESSMENT & PLAN:  1. Chronic systolic HF - due to chemotoxicity from breast CA. EF in January 2016 40-45% . Cath normal in [place].  - s/p ICD - Echo 8/20 EF 35-40%. Personally reviewed - Doing well. NYHA I - Continue current dose of carvedilol, inspra, hydralazine, and imdur.  - Switch lisinopril to Entresto 24/26 bid with 36 hour washout - Check labs today  2. Breast CA  - s/p treatment 2008. No evidence of recurrence  3. Paroxysmal AF  - In NSR today.On Xarelto. No bleeding   4. H/o DVT - on xarelto . No bleeding problems.   5. Hyperlipidemia  - on crestor. Followed by Dr. Criss [name]  6. H/O VT ICD discharge x3 11/2015. - VT quiescent - Device reprogrammed and BB increased. She has follow up with Dr. Caryl [name].    7. HTN - BP high here but controlled at home  Glori Bickers MD 12:25 PM

## 2019-07-23 NOTE — Patient Instructions (Signed)
Labs done today. We will contact you only if your labs are abnormal.  STOP Lisinopril  CHANGE Lasix. Only take one tablet by mouth every morning.   START Entresto 24-26mg (1 tab) by mouth two times daily.   Your physician recommends that you schedule a follow-up appointment in: 6 months. We will contact you to schedule an appointment.   At the [facility], you and your health needs are our priority. As part of our continuing mission to provide you with exceptional heart care, we have created designated Provider Care Teams. These Care Teams include your primary Cardiologist (physician) and Advanced Practice Providers (APPs- Physician Assistants and Nurse Practitioners) who all work together to provide you with the care you need, when you need it.   You may see any of the following providers on your designated Care Team at your next follow up: Marland Kitchen Dr Glori Bickers . Dr Loralie Champagne . Darrick Grinder, NP . Lyda Jester, PA   Please be sure to bring in all your medications bottles to every appointment.

## 2019-07-24 ENCOUNTER — Telehealth (HOSPITAL_COMMUNITY): Payer: Self-pay | Admitting: Pharmacy Technician

## 2019-07-24 NOTE — Telephone Encounter (Signed)
Patient Advocate Encounter   Received notification from [facility] that prior authorization for Claudia [name] is required.   PA submitted on CoverMyMeds Key  [id number] Status is pending   Will continue to follow.  Charlann Boxer, CPhT

## 2019-07-27 NOTE — Telephone Encounter (Signed)
Received notification from [facility] that the request for prior authorization for Claudia [name] has been denied due to NYHA class 1 (needs to be between 2 and 4 along with EF less than or equal to 40%).    This determination is currently being appealed.  The appeal packet will be faxed to [phone] upon completion.   This encounter will continue to be updated until final determination.     Called and spoke with patient. She is aware that we are going to have to appeal the denial of Entresto. Called and gave her pharmacy [facility]) information for 30 day free card. They are getting the medication ready for her to pick up. Patient is aware.  Phone # provided for [employer]: [place], CPhT

## 2019-08-03 NOTE — Telephone Encounter (Signed)
Sent Entresto appeal to [employer]. Will follow up.  Charlann Boxer, CPhT

## 2019-08-05 NOTE — Telephone Encounter (Signed)
Advanced Heart Failure Patient Advocate Encounter  Appeal for Entresto 24-26mg  authorization has been approved.     Effective dates: 08/05/2019 through 08/03/2020  Patients co-pay is $8.95  Called and informed patient of approval.    Charlann Boxer, CPhT

## 2019-08-06 ENCOUNTER — Other Ambulatory Visit (HOSPITAL_COMMUNITY): Payer: Self-pay

## 2019-08-06 MED ORDER — ENTRESTO 24-26 MG PO TABS: 1.0000 | ORAL_TABLET | Freq: Two times a day (BID) | ORAL | 1 refills | Status: DC

## 2019-08-25 ENCOUNTER — Ambulatory Visit (INDEPENDENT_AMBULATORY_CARE_PROVIDER_SITE_OTHER): Payer: HMO | Admitting: *Deleted

## 2019-08-25 ENCOUNTER — Other Ambulatory Visit: Payer: Self-pay

## 2019-08-25 DIAGNOSIS — I428 Other cardiomyopathies: Secondary | ICD-10-CM | POA: Diagnosis not present

## 2019-08-25 LAB — CUP PACEART INCLINIC DEVICE CHECK
Date Time Interrogation Session: 20201124171208
Implantable Lead Implant Date: 20150819
Implantable Lead Location: 753862
Implantable Lead Model: 3010
Implantable Pulse Generator Implant Date: 20150819
Pulse Gen Model: 1010
Pulse Gen Serial Number: 19497

## 2019-08-25 NOTE — Progress Notes (Signed)
Subcutaneous ICD check in clinic. 0 untreated episodes; 0 treated episodes; o shocks delivered. Electrode impedance status okay. No programming changes. Remaining longevity to ERI 16%. In clinic check at follow up visit 11/24/19 with DR Caryl Comes.

## 2019-09-01 DIAGNOSIS — I480 Paroxysmal atrial fibrillation: Secondary | ICD-10-CM | POA: Diagnosis not present

## 2019-09-01 DIAGNOSIS — M1830 Unilateral post-traumatic osteoarthritis of first carpometacarpal joint, unspecified hand: Secondary | ICD-10-CM | POA: Diagnosis not present

## 2019-09-01 DIAGNOSIS — I5042 Chronic combined systolic (congestive) and diastolic (congestive) heart failure: Secondary | ICD-10-CM | POA: Diagnosis not present

## 2019-09-01 DIAGNOSIS — Z7901 Long term (current) use of anticoagulants: Secondary | ICD-10-CM | POA: Diagnosis not present

## 2019-09-01 DIAGNOSIS — N183 Chronic kidney disease, stage 3 unspecified: Secondary | ICD-10-CM | POA: Diagnosis not present

## 2019-09-01 DIAGNOSIS — I427 Cardiomyopathy due to drug and external agent: Secondary | ICD-10-CM | POA: Diagnosis not present

## 2019-09-01 DIAGNOSIS — Z7189 Other specified counseling: Secondary | ICD-10-CM | POA: Diagnosis not present

## 2019-09-01 DIAGNOSIS — Z6835 Body mass index (BMI) 35.0-35.9, adult: Secondary | ICD-10-CM | POA: Diagnosis not present

## 2019-11-04 ENCOUNTER — Other Ambulatory Visit: Payer: Self-pay

## 2019-11-04 NOTE — Patient Outreach (Signed)
  [facility]) Care Management Chronic Special Needs Program  11/04/2019  Name: JACOB WOLAK DOB: 02/16/1956  MRN: [id number]  Ms. Tyyanna Gaskins is enrolled in a chronic special needs plan for Heart Failure. Client called with no answer No answer and HIPAA compliant message left. 1st attempt Plan for 2nd outreach call in one week Chronic care management coordinator will attempt outreach in one week.   Peter Garter RN, Jackquline Denmark, CDE Chronic Care Management Coordinator [facility] [phone]

## 2019-11-11 ENCOUNTER — Other Ambulatory Visit: Payer: Self-pay

## 2019-11-11 NOTE — Patient Outreach (Signed)
[facility]) Care Management Chronic Special Needs Program  11/11/2019  Name: Claudia [name] DOB: [birth year]  MRN: [id number]  Claudia [name] is enrolled in a chronic special needs plan for Heart Failure. Chronic Care Management Coordinator telephoned client to review health risk assessment and to develop individualized care plan.  Reviewed the chronic care management program, importance of client participation, and taking their care plan to all provider appointments and inpatient facilities.  Reviewed the transition of care process and possible referral to community care management.  Subjective: Client states she has been doing good.  States she is weighting daily and she knows when to take extra Lasix and when to call her doctor.  States she saw her heart failure doctor in October.  States she is to see her primary care doctor on 11/30/19.  States she is to see her defibrillator doctor in a few weeks.  States she thinks she will need a new battery for it this year.  Denies any shortness of breath, swelling or chest pains.  States she is exercising daily doing cardio and stretching daily for about 30 minutes.  States she does not feel safe going back to the gym yet.  Denies any issues with affording her medications.  States she does not wish to complete her Advanced Directives at this time.  Sttes she is watching the sodium in her diet.    Goals Addressed            This Visit's Progress   . COMPLETED: Advanced Care Planning complete by next 9 months(continue 07/03/19)       Client has forms Client does not wish to complete Advanced Directives  at this time Goal not completed 11/11/19 per clients request     . Client understands the importance of follow-up with providers by attending scheduled visits   On track    Plan to keep scheduled appointments with providers Reinforced to keep scheduled appointments with providers    . Client will report no worsening of symptoms related to  heart disease within the next 6 months       Signs and symptoms of heart failure reviewed Education reviewed for self-management of Heart failure including heart failure action plan in her Health Team [employer](HTA) calendar. Advised to notify doctor for symptoms Call 911 for severe symptoms Weigh daily and record weighs Follow a low salt diet Sent EMMI:Heart Failure exercise guide    . Client will report weighing and recording weights daily within the next 6 months(continue 11/11/19) (pt-stated)   On track    Reports weighting daily Encouraged to record weights daily in Health Team Advantage(HTA) calendar    . COMPLETED: Decrease inpatient Heart Failure admissions/ readmissions with in the year   On track    No admissions for 2020    . COMPLETED: Decrease the use of hospital emergency department related to heart failure within the next year   On track    No ED visits for 2020    . Maintain timely refills of Heart Failure medication as prescribed within the year    On track    Maintaining timely refills of medications per dispense report Reinforced importance of getting medications refilled on time    . Obtain annual  Lipid Profile, LDL-C   On track    The goal for LDL is less than 70 mg/dL as you are at high risk for complications    . Visit Primary Care Provider or Cardiologist at least 2 times  per year   On track    Last visit with cardiology 07/22/20 Plan to keep scheduled primary care appointment on 11/30/19     Client reports no symptoms of heart failure and weighing daily Client verbalizes good teach back of s/sx of heart failure to call her doctor Reviewed number for 24-hour [place] precautions Encouraged to sign up for COVID vaccination when possible Plan:  Send successful outreach letter with a copy of their individualized care plan, Send individual care plan to provider and Send educational material Client has received Health Team Advantage(HTA)  calendar send EMMI:Heart Failure exercise guide   Chronic care management coordination will outreach in:  6 Months     [place], [facility], [place] Management Coordinator [facility] [phone]

## 2019-11-24 ENCOUNTER — Other Ambulatory Visit: Payer: Self-pay

## 2019-11-24 ENCOUNTER — Ambulatory Visit (INDEPENDENT_AMBULATORY_CARE_PROVIDER_SITE_OTHER): Payer: HMO | Admitting: Internal Medicine

## 2019-11-24 VITALS — BP 120/78 | HR 94 | Ht 66.0 in | Wt 217.8 lb

## 2019-11-24 DIAGNOSIS — I480 Paroxysmal atrial fibrillation: Secondary | ICD-10-CM | POA: Diagnosis not present

## 2019-11-24 DIAGNOSIS — I472 Ventricular tachycardia: Secondary | ICD-10-CM | POA: Diagnosis not present

## 2019-11-24 DIAGNOSIS — Z4502 Encounter for adjustment and management of automatic implantable cardiac defibrillator: Secondary | ICD-10-CM | POA: Diagnosis not present

## 2019-11-24 DIAGNOSIS — I4729 Other ventricular tachycardia: Secondary | ICD-10-CM

## 2019-11-24 DIAGNOSIS — I428 Other cardiomyopathies: Secondary | ICD-10-CM

## 2019-11-24 NOTE — Patient Instructions (Addendum)
Medication Instructions:  Your physician recommends that you continue on your current medications as directed. Please refer to the Current Medication list given to you today.  Labwork: None ordered.  Testing/Procedures: None ordered.  Follow-Up: Your physician wants you to follow-up in: Tuesday, 01/26/2020 at 3pm with device clinic.You will receive a reminder letter in the mail two months in advance. If you don't receive a letter, please call our office to schedule the follow-up appointment.  Remote monitoring is used to monitor your Pacemaker of ICD from home. This monitoring reduces the number of office visits required to check your device to one time per year. It allows [place] to keep an eye on the functioning of your device to ensure it is working properly.  Any Other Special Instructions Will Be Listed Below (If Applicable).  If you need a refill on your cardiac medications before your next appointment, please call your pharmacy.

## 2019-11-24 NOTE — Progress Notes (Signed)
Patient Care Team: Iona Beard, MD as PCP - General (Family Medicine) Nicholas Lose, MD as Consulting Physician (Hematology and Oncology) Bensimhon, Shaune Pascal, MD as Consulting Physician (Cardiology) Dimitri Ped, RN as [place] Management   HPI  Claudia [name] is a 64 y.o. female seen in follow-up for ICD implanted for primary prevention for nonischemic cardiomyopathy. She had previously implanted transvenous device removed because of infection.   She suffered appropriate ICD therapy 3/17.  Interestingly, also has slow ventricular tachycardia associated with T-wave oversensing which prompted the device therapy. Beta blockers were increased;    DATE TEST EF   4/15 Echo   15-20 %   8/20 Echo   35-40 %         Date Cr K Hgb  10/20 1.05 4.1 12.4         No interval palps  The patient denies chest pain, shortness of breath, nocturnal dyspnea, orthopnea or peripheral edema.  There have been no palpitations, lightheadedness or syncope.     Records and Results Reviewed   Past Medical History:  Diagnosis Date  . AICD (automatic cardioverter/defibrillator) present 09/15/2014  . Breast cancer ([place])   . CHF (congestive heart failure) ([place])   . 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 CHOLECYSTECTOMY    . device removal     defib  . fiber tumor    . masectomy      Current Outpatient Medications  Medication Sig Dispense Refill  . acetaminophen (TYLENOL) 325 MG tablet Take 650 mg by mouth every 6 (six) hours as needed.    . carvedilol (COREG) 25 MG tablet Take 1 tablet (25 mg total) by mouth 2 (two) times daily with a meal. 180 tablet 3  . cyclobenzaprine (FLEXERIL) 5 MG tablet Take 5 mg by mouth at bedtime.   2  . eplerenone (INSPRA) 25 MG tablet Take 12.5 mg by mouth daily.    . furosemide (LASIX) 40 MG tablet Take 1 tablet (40 mg total) by mouth daily.  30 tablet 5  . hydrALAZINE (APRESOLINE) 25 MG tablet Take 1.5 tablets (37.5 mg total) by mouth 3 (three) times daily. 405 tablet 3  . isosorbide mononitrate (IMDUR) 30 MG 24 hr tablet Take one tablet (30 mg) by mouth at night 90 tablet 3  . rivaroxaban (XARELTO) 20 MG TABS tablet TAKE 1 TABLET EVERY DAY WITH SUPPER 90 tablet 3  . sacubitril-valsartan (ENTRESTO) 24-26 MG Take 1 tablet by mouth 2 (two) times daily. 180 tablet 1   No current facility-administered medications for this visit.    No Known Allergies    Review of Systems negative except from HPI and PMH  Physical Exam BP 120/78   Pulse 94   Ht 5\' 6"  (1.676 m)   Wt 217 lb 12.8 oz (98.8 kg)   SpO2 99%   BMI 35.15 kg/m  Well developed and well nourished in no acute distress HENT normal Neck supple with JVP-flat Clear Device pocket well healed; without hematoma or erythema.  There is no tethering  Regular rate and rhythm, no   murmur Abd-soft with active BS No Clubbing cyanosis * edema Skin-warm and dry A & Oriented  Grossly normal sensory and motor function  ECG sinus @ 90 17/09/38  Assessment and  Plan Nonischemic cardiomyopathy  S ICD  The patient's device was interrogated.  The information was reviewed. No changes were  made in the programming.     Ventricular tachycardia-   Atrial fibrillation-paroxysmal  DVT  Sleep disordered breathing  CHF chronic Systolic   On Anticoagulation;  No bleeding issues   No palps  Euvolemic continue current meds  At last CHF clinic entresto started-- will defer SGLT2i to them  BP well controlled  Device approaching ERI (10%)_

## 2019-11-26 LAB — CUP PACEART INCLINIC DEVICE CHECK
Date Time Interrogation Session: 20210223185331
Implantable Lead Implant Date: 20150819
Implantable Lead Location: 753862
Implantable Lead Model: 3010
Implantable Pulse Generator Implant Date: 20150819
Pulse Gen Model: 1010
Pulse Gen Serial Number: 19497

## 2019-11-30 DIAGNOSIS — I5040 Unspecified combined systolic (congestive) and diastolic (congestive) heart failure: Secondary | ICD-10-CM | POA: Diagnosis not present

## 2019-11-30 DIAGNOSIS — I480 Paroxysmal atrial fibrillation: Secondary | ICD-10-CM | POA: Diagnosis not present

## 2019-11-30 DIAGNOSIS — Z7901 Long term (current) use of anticoagulants: Secondary | ICD-10-CM | POA: Diagnosis not present

## 2019-11-30 DIAGNOSIS — Z95810 Presence of automatic (implantable) cardiac defibrillator: Secondary | ICD-10-CM | POA: Diagnosis not present

## 2019-11-30 DIAGNOSIS — Z7189 Other specified counseling: Secondary | ICD-10-CM | POA: Diagnosis not present

## 2019-11-30 DIAGNOSIS — I427 Cardiomyopathy due to drug and external agent: Secondary | ICD-10-CM | POA: Diagnosis not present

## 2019-12-07 ENCOUNTER — Other Ambulatory Visit: Payer: Self-pay

## 2019-12-07 ENCOUNTER — Ambulatory Visit (INDEPENDENT_AMBULATORY_CARE_PROVIDER_SITE_OTHER): Payer: HMO

## 2019-12-07 ENCOUNTER — Encounter: Payer: Self-pay | Admitting: Podiatry

## 2019-12-07 ENCOUNTER — Ambulatory Visit (INDEPENDENT_AMBULATORY_CARE_PROVIDER_SITE_OTHER): Payer: HMO | Admitting: Podiatry

## 2019-12-07 VITALS — BP 149/97 | HR 83 | Temp 98.0°F

## 2019-12-07 DIAGNOSIS — M79671 Pain in right foot: Secondary | ICD-10-CM

## 2019-12-07 DIAGNOSIS — M79672 Pain in left foot: Secondary | ICD-10-CM | POA: Diagnosis not present

## 2019-12-07 DIAGNOSIS — M722 Plantar fascial fibromatosis: Secondary | ICD-10-CM

## 2019-12-07 NOTE — Patient Instructions (Signed)

## 2019-12-09 NOTE — Progress Notes (Signed)
Subjective:   Patient ID: Claudia [name], female   DOB: 64 y.o.   MRN: [id number]   HPI Patient presents stating that she has had trouble with her ambulation and affect and feels like it is the way she walks with high arches and she is concerned about this and feels like she needs support.  Feels like the left foot is the one that has collapsed does not smoke and would like to be more active   Review of Systems  All other systems reviewed and are negative.       Objective:  Physical Exam Vitals and nursing note reviewed.  Constitutional:      Appearance: She is well-developed.  Pulmonary:     Effort: Pulmonary effort is normal.  Musculoskeletal:        General: Normal range of motion.  Skin:    General: Skin is warm.  Neurological:     Mental Status: She is alert.     Neurovascular status found to be intact muscle strength was found to be adequate range of motion within normal limits.  Patient is found to have discomfort in the midfoot area left with collapse of medial longitudinal arch and stress on the foot lower leg and into the hip.  Patient has good digital perfusion found to be well oriented x3 and I did not note any muscle deficit upon testing     Assessment:  Moderate flatfoot deformity with arch structure occurring and changing over the years with possibility relationship to leg and hip issues     Plan:  H&P reviewed all conditions and for the left I did dispense a fascial brace to try to hold up the arch take pressure off this and discussed the possibility for long-term orthotics if improvement is obtained.  Patient will be seen back to recheck and is encouraged to call with questions concerns  X-rays indicate that there is moderate depression of the arch left with cavus foot structure residual still noted right over left

## 2019-12-11 DIAGNOSIS — Z1231 Encounter for screening mammogram for malignant neoplasm of breast: Secondary | ICD-10-CM | POA: Diagnosis not present

## 2019-12-15 ENCOUNTER — Other Ambulatory Visit: Payer: Self-pay | Admitting: Podiatry

## 2019-12-15 DIAGNOSIS — M722 Plantar fascial fibromatosis: Secondary | ICD-10-CM

## 2019-12-30 ENCOUNTER — Encounter (HOSPITAL_COMMUNITY): Payer: Self-pay

## 2020-01-18 ENCOUNTER — Ambulatory Visit: Payer: HMO | Admitting: Podiatry

## 2020-01-22 ENCOUNTER — Telehealth (HOSPITAL_COMMUNITY): Payer: Self-pay | Admitting: Cardiology

## 2020-01-22 ENCOUNTER — Other Ambulatory Visit: Payer: Self-pay

## 2020-01-22 ENCOUNTER — Encounter (HOSPITAL_COMMUNITY): Payer: Self-pay | Admitting: Internal Medicine

## 2020-01-22 ENCOUNTER — Ambulatory Visit (HOSPITAL_COMMUNITY)
Admission: RE | Admit: 2020-01-22 | Discharge: 2020-01-22 | Disposition: A | Discharge status: Home / Self Care | Payer: HMO | Source: Ambulatory Visit | Attending: Internal Medicine | Admitting: Internal Medicine

## 2020-01-22 VITALS — BP 132/84 | HR 76 | Wt 219.0 lb

## 2020-01-22 DIAGNOSIS — Z7901 Long term (current) use of anticoagulants: Secondary | ICD-10-CM | POA: Insufficient documentation

## 2020-01-22 DIAGNOSIS — I480 Paroxysmal atrial fibrillation: Secondary | ICD-10-CM | POA: Diagnosis not present

## 2020-01-22 DIAGNOSIS — Z95810 Presence of automatic (implantable) cardiac defibrillator: Secondary | ICD-10-CM | POA: Insufficient documentation

## 2020-01-22 DIAGNOSIS — Z853 Personal history of malignant neoplasm of breast: Secondary | ICD-10-CM | POA: Diagnosis not present

## 2020-01-22 DIAGNOSIS — Z87891 Personal history of nicotine dependence: Secondary | ICD-10-CM | POA: Diagnosis not present

## 2020-01-22 DIAGNOSIS — I472 Ventricular tachycardia, unspecified: Secondary | ICD-10-CM

## 2020-01-22 DIAGNOSIS — I110 Hypertensive heart disease with heart failure: Secondary | ICD-10-CM | POA: Diagnosis not present

## 2020-01-22 DIAGNOSIS — I10 Essential (primary) hypertension: Secondary | ICD-10-CM

## 2020-01-22 DIAGNOSIS — Z86718 Personal history of other venous thrombosis and embolism: Secondary | ICD-10-CM | POA: Diagnosis not present

## 2020-01-22 DIAGNOSIS — E785 Hyperlipidemia, unspecified: Secondary | ICD-10-CM | POA: Diagnosis not present

## 2020-01-22 DIAGNOSIS — Z79899 Other long term (current) drug therapy: Secondary | ICD-10-CM | POA: Insufficient documentation

## 2020-01-22 DIAGNOSIS — I5022 Chronic systolic (congestive) heart failure: Secondary | ICD-10-CM | POA: Diagnosis not present

## 2020-01-22 DIAGNOSIS — G4733 Obstructive sleep apnea (adult) (pediatric): Secondary | ICD-10-CM | POA: Diagnosis not present

## 2020-01-22 LAB — CBC
HCT: 41.1 % (ref 36.0–46.0)
Hemoglobin: 12.7 g/dL (ref 12.0–15.0)
MCH: 26.9 pg (ref 26.0–34.0)
MCHC: 30.9 g/dL (ref 30.0–36.0)
MCV: 87.1 fL (ref 80.0–100.0)
Platelets: 308 10*3/uL (ref 150–400)
RBC: 4.72 MIL/uL (ref 3.87–5.11)
RDW: 13.5 % (ref 11.5–15.5)
WBC: 3.3 10*3/uL — ABNORMAL LOW (ref 4.0–10.5)
nRBC: 0 % (ref 0.0–0.2)

## 2020-01-22 LAB — BASIC METABOLIC PANEL
Anion gap: 10 (ref 5–15)
BUN: 10 mg/dL (ref 8–23)
CO2: 22 mmol/L (ref 22–32)
Calcium: 9.4 mg/dL (ref 8.9–10.3)
Chloride: 104 mmol/L (ref 98–111)
Creatinine, Ser: 1.02 mg/dL — ABNORMAL HIGH (ref 0.44–1.00)
GFR calc Af Amer: >60 mL/min (ref >60)
GFR calc non Af Amer: 58 mL/min — ABNORMAL LOW (ref >60)
Glucose, Bld: 103 mg/dL — ABNORMAL HIGH (ref 70–99)
Potassium: 4.3 mmol/L (ref 3.5–5.1)
Sodium: 136 mmol/L (ref 135–145)

## 2020-01-22 LAB — BRAIN NATRIURETIC PEPTIDE: B Natriuretic Peptide: 81.1 pg/mL (ref 0.0–100.0)

## 2020-01-22 MED ORDER — ENTRESTO 49-51 MG PO TABS
1.0000 | ORAL_TABLET | Freq: Two times a day (BID) | ORAL | 6 refills | Status: DC
Start: 2020-01-22 — End: 2020-01-27

## 2020-01-22 NOTE — Progress Notes (Addendum)
Patient Name: Claudia [name]        DOB: 1956-02-15      Height: 219    Weight: 5 6  Office Name:[facility]         Referring Provider:Dr Bensimhon  Today's Date: 01/22/2020  Date:   STOP BANG RISK ASSESSMENT S (snore) Have you been told that you snore?     YES   T (tired) Are you often tired, fatigued, or sleepy during the day?   YES  O (obstruction) Do you stop breathing, choke, or gasp during sleep? NO   P (pressure) Do you have or are you being treated for high blood pressure? NO   B (BMI) Is your body index greater than 35 kg/m? YES   A (age) Are you 65 years old or older? YES   N (neck) Do you have a neck circumference greater than 16 inches?   NO   G (gender) Are you a female? NO   TOTAL STOP/BANG "YES" ANSWERS                                                                        For Office Use Only              Procedure Order Form    YES to 3+ Stop Bang questions OR two clinical symptoms - patient qualifies for WatchPAT (CPT 95800)     Submit: This Form + Patient Face Sheet + Clinical Note via CloudPAT or Fax: [phone]         Clinical Notes: Will consult Sleep Specialist and refer for management of therapy due to patient increased risk of Sleep Apnea. Ordering a sleep study due to the following two clinical symptoms: Excessive daytime sleepiness G47.10/ Loud snoring R06.83    I understand that I am proceeding with a home sleep apnea test as ordered by my treating physician. I understand that untreated sleep apnea is a serious cardiovascular risk factor and it is my responsibility to perform the test and seek management for sleep apnea. I will be contacted with the results and be managed for sleep apnea by a local sleep physician. I will be receiving equipment and further instructions from [facility]. I shall promptly ship back the equipment via the included mailing label. I understand my insurance will be billed for the test and as the patient I  am responsible for any insurance related out-of-pocket costs incurred. I have been provided with written instructions and can call for additional video or telephonic instruction, with 24-hour availability of qualified personnel to answer any questions: Patient Help Desk [phone].  Patient Signature ______________________________________________________   Date______________________ Patient Telemedicine Verbal Consent

## 2020-01-22 NOTE — Telephone Encounter (Signed)
Order, OV note, stop bang and demographics all faxed to [employer] at [phone]  

## 2020-01-22 NOTE — Addendum Note (Signed)
Encounter addended by: Kerry Dory, CMA on: 01/22/2020 12:19 PM  Actions taken: Order list changed, Diagnosis association updated, Clinical Note Signed, Charge Capture section accepted

## 2020-01-22 NOTE — Patient Instructions (Signed)
INCREASE Entresto to 49/51mg , one tab twice daily  Labs today We will only contact you if something comes back abnormal or we need to make some changes. Otherwise no news is good news!  Your provider has recommended that you have a home sleep study.  [employer] is the company that does these test.  They will contact you by phone and must speak with you before they can ship the equipment.  Once they have spoken with you they will send the equipment right to your home with instructions on how to set it up.  Once you have completed the test simply box all the equipment back up and mail back to the company.  IF you have any questions or issues with the equipment please call the company directly at [phone].  If your test is positive for sleep apnea and you need a home CPAP machine you will be contacted by Dr Theodosia Blender office [facility]) to set this up.   Your physician recommends that you schedule a follow-up appointment in: 6 months with Dr Haroldine Laws, please call our office in September 2021.   Do the following things EVERYDAY: 1) Weigh yourself in the morning before breakfast. Write it down and keep it in a log. 2) Take your medicines as prescribed 3) Eat low salt foods--Limit salt (sodium) to 2000 mg per day.  4) Stay as active as you can everyday 5) Limit all fluids for the day to less than 2 liters   At the [facility], you and your health needs are our priority. As part of our continuing mission to provide you with exceptional heart care, we have created designated Provider Care Teams. These Care Teams include your primary Cardiologist (physician) and Advanced Practice Providers (APPs- Physician Assistants and Nurse Practitioners) who all work together to provide you with the care you need, when you need it.   You may see any of the following providers on your designated Care Team at your next follow up: Marland Kitchen Dr Glori Bickers . Dr Loralie Champagne . Darrick Grinder,  NP . Lyda Jester, PA . Audry Riles, PharmD   Please be sure to bring in all your medications bottles to every appointment.

## 2020-01-22 NOTE — Addendum Note (Signed)
Encounter addended by: Kerry Dory, CMA on: 01/22/2020 12:28 PM  Actions taken: Clinical Note Signed

## 2020-01-22 NOTE — Progress Notes (Signed)
Patient ID: Claudia [name], female   DOB: 1955-11-03, 64 y.o.   MRN: [id number]  Primary Cardiologist: Dr Haroldine Laws PCP: Dr Criss Rosales  Oncology: Dr Lindi Adie.  EP: Dr Lynelle Doctor  HPI: Ms. Wiedmann is a 64 y/o woman with h/o breast cancer in [id number] and systolic HF due to chemotoxicity. She also h/o PAF and previous DVT for which she is on chronic anticoagulation. She moved from [place] in 07/2014 and saw Dr. Johnsie Cancel.   She developed breast cancer in 2008 and underwent chemotherapy and in the wake of this, she developed congestive heart failure with EF 20%. Cardiac cath with normal coronaries. She received an ICD It became infected in the spring of 2015 with staph and she underwent extraction. She has undergone S ICD implantation following prolonged use of a LifeVest. She was ultimately transferred to [facility] in [place] in 01/2014 for consideration of heart transplantation. They diuresed her over 60 pounds and she improved markedly.   March 2017 after  ICD fired x3. Device was reprogrammed and bb was increased 18.75 mg twice a day. ECHO was repeated EF 45%. Dischaege weight 184 pounds.   Echo 8/20 EF 35-40%  Today she returns for HF follow up. At last visit we switched to [facility] 24/26 bid. Tolerating well. No low BP. Goes to [employer]. Rides bike 35 mins and does weight training. No CP, SOB, orthopnea or PND.    ECHO 11/2015: EF 45% Grade I DD ECHO 10/07/14 and EF 40-45% with normal RV.   Labs: 08/24/14: Potassium 4.2 Creatinine 1.07 hgb 13.1 01/31/2015: K 4.0 Creatinine 1.28   ROS: All systems negative except as listed in HPI, PMH and Problem List.  SH:  Social History   Socioeconomic History  . Marital status: Divorced    Spouse name: Not on file  . Number of children: Not on file  . Years of education: Not on file  . Highest education level: Not on file  Occupational History  . Not on file  Tobacco Use  . Smoking status: Former Research scientist (life sciences)  . Smokeless tobacco: Never Used  Substance and Sexual Activity    . Alcohol use: No  . Drug use: No  . Sexual activity: Not on file  Other Topics Concern  . Not on file  Social History Narrative  . Not on file   Social Determinants of Health   Financial Resource Strain:   . Difficulty of Paying Living Expenses:   Food Insecurity: No Food Insecurity  . Worried About Charity fundraiser in the Last Year: Never true  . Ran Out of Food in the Last Year: Never true  Transportation Needs: No Transportation Needs  . Lack of Transportation (Medical): No  . Lack of Transportation (Non-Medical): No  Physical Activity:   . Days of Exercise per Week:   . Minutes of Exercise per Session:   Stress:   . Feeling of Stress :   Social Connections:   . Frequency of Communication with Friends and Family:   . Frequency of Social Gatherings with Friends and Family:   . Attends Religious Services:   . Active Member of Clubs or Organizations:   . Attends Archivist Meetings:   Marland Kitchen Marital Status:   Intimate Partner Violence:   . Fear of Current or Ex-Partner:   . Emotionally Abused:   Marland Kitchen Physically Abused:   . Sexually Abused:     FH:  Family History  Problem Relation Age of Onset  . Diabetes Mother   .  Cancer Father     Past Medical History:  Diagnosis Date  . AICD (automatic cardioverter/defibrillator) present 09/15/2014  . Breast cancer ([place])   . CHF (congestive heart failure) ([place])   . Implantable cardioverter-defibrillator infection  s/p extraction 10/07/2014    Current Outpatient Medications  Medication Sig Dispense Refill  . acetaminophen (TYLENOL) 325 MG tablet Take 650 mg by mouth every 6 (six) hours as needed.    . carvedilol (COREG) 25 MG tablet Take 1 tablet (25 mg total) by mouth 2 (two) times daily with a meal. 180 tablet 3  . cyclobenzaprine (FLEXERIL) 5 MG tablet Take 5 mg by mouth at bedtime.   2  . eplerenone (INSPRA) 25 MG tablet Take 12.5 mg by mouth daily.    . furosemide (LASIX) 40 MG tablet Take 1 tablet (40 mg total)  by mouth daily. 30 tablet 5  . hydrALAZINE (APRESOLINE) 25 MG tablet Take 1.5 tablets (37.5 mg total) by mouth 3 (three) times daily. 405 tablet 3  . isosorbide mononitrate (IMDUR) 30 MG 24 hr tablet Take one tablet (30 mg) by mouth at night 90 tablet 3  . rivaroxaban (XARELTO) 20 MG TABS tablet TAKE 1 TABLET EVERY DAY WITH SUPPER 90 tablet 3  . sacubitril-valsartan (ENTRESTO) 24-26 MG Take 1 tablet by mouth 2 (two) times daily. 180 tablet 1   No current facility-administered medications for this encounter.    Vitals:   01/22/20 1120  BP: 132/84  Pulse: 76  SpO2: 100%  Weight: 99.3 kg (219 lb)    PHYSICAL EXAM:  General:  Well appearing. No resp difficulty HEENT: normal Neck: supple. no JVD. Carotids 2+ bilat; no bruits. No lymphadenopathy or thryomegaly appreciated. Cor: PMI nondisplaced. Regular rate & rhythm. No rubs, gallops or murmurs. Lungs: clear Abdomen: soft, nontender, nondistended. No hepatosplenomegaly. No bruits or masses. Good bowel sounds. Extremities: no cyanosis, clubbing, rash, edema Neuro: alert & orientedx3, cranial nerves grossly intact. moves all 4 extremities w/o difficulty. Affect pleasant   ASSESSMENT & PLAN:  1. Chronic systolic HF - due to chemotoxicity from breast CA. EF in January 2016 40-45% . Cath normal in [place].  - s/p ICD - Echo 8/20 EF 35-40%. Personally reviewed - Continues to do well. NYHA I-II. Volume status ok.  - Continue current dose of carvedilol, inspra, hydralazine, and imdur.  - Increase Entresto to 49/51 bid - Labs today  2. Breast CA  - s/p treatment 2008. No evidence of recurrence  3. Paroxysmal AF  - In NSR today. On Xarelto no bleeding   4. H/o DVT - on xarelto . No bleeding problems. Continue lifelong   5. Hyperlipidemia  - on crestor. Followed by Dr. Criss Rosales  6. H/O VT ICD discharge x3 11/2015. - VT quiescent - Device near EOL. Following with Dr. Caryl Comes  7. HTN - BP improving but not fully at goal. Will increase  Entresto  8. OSA - wore CPAP in past but has not worn in years - repeat PSG  Glori Bickers MD 12:04 PM

## 2020-01-25 ENCOUNTER — Other Ambulatory Visit (HOSPITAL_COMMUNITY): Payer: Self-pay

## 2020-01-25 MED ORDER — FUROSEMIDE 40 MG PO TABS: 40.0000 mg | ORAL_TABLET | Freq: Every day | ORAL | 5 refills | Status: DC

## 2020-01-26 ENCOUNTER — Ambulatory Visit (INDEPENDENT_AMBULATORY_CARE_PROVIDER_SITE_OTHER): Payer: HMO | Admitting: *Deleted

## 2020-01-26 ENCOUNTER — Other Ambulatory Visit: Payer: Self-pay

## 2020-01-26 DIAGNOSIS — I428 Other cardiomyopathies: Secondary | ICD-10-CM | POA: Diagnosis not present

## 2020-01-27 ENCOUNTER — Other Ambulatory Visit (HOSPITAL_COMMUNITY): Payer: Self-pay | Admitting: *Deleted

## 2020-01-27 ENCOUNTER — Encounter: Payer: Self-pay | Admitting: Podiatry

## 2020-01-27 ENCOUNTER — Ambulatory Visit: Payer: HMO | Admitting: Podiatry

## 2020-01-27 VITALS — Temp 97.3°F

## 2020-01-27 DIAGNOSIS — M79671 Pain in right foot: Secondary | ICD-10-CM | POA: Diagnosis not present

## 2020-01-27 DIAGNOSIS — M79672 Pain in left foot: Secondary | ICD-10-CM

## 2020-01-27 LAB — CUP PACEART INCLINIC DEVICE CHECK
Date Time Interrogation Session: 20210427163116
Implantable Lead Implant Date: 20150819
Implantable Lead Location: 753862
Implantable Lead Model: 3010
Implantable Pulse Generator Implant Date: 20150819
Pulse Gen Model: 1010
Pulse Gen Serial Number: 19497

## 2020-01-27 MED ORDER — ENTRESTO 49-51 MG PO TABS: 1.0000 | ORAL_TABLET | Freq: Two times a day (BID) | ORAL | 6 refills | Status: DC

## 2020-01-27 NOTE — Progress Notes (Signed)
Subjective:   Patient ID: Claudia [name], female   DOB: 64 y.o.   MRN: [id number]   HPI Patient states she is improving with pain still does noted upon deep palpation and states that the brace has been helpful   ROS      Objective:  Physical Exam  Neurovascular status intact with moderate depression of the arch with inflammation of the mid arch area which is improved and only painful upon deep palpation     Assessment:  Mid arch fasciitis left with structural changes creating problem     Plan:  H&P reviewed condition discussed treatment options that could be undertaken but at this point I recommended good support shoes continued brace usage physical therapy and oral anti-inflammatories and topical anti-inflammatories as needed.  Encouraged weight loss

## 2020-01-27 NOTE — Progress Notes (Signed)
Subcutaneous ICD check in clinic.  No untreated episodes; No treated episodes; No shocks delivered. Electrode impedance status okay. No programming changes. Remaining longevity to ERI 9%. Next scheduled Battery check in office 03/29/20.

## 2020-01-29 DIAGNOSIS — I480 Paroxysmal atrial fibrillation: Secondary | ICD-10-CM | POA: Diagnosis not present

## 2020-01-29 DIAGNOSIS — I130 Hypertensive heart and chronic kidney disease with heart failure and stage 1 through stage 4 chronic kidney disease, or unspecified chronic kidney disease: Secondary | ICD-10-CM | POA: Diagnosis not present

## 2020-01-29 DIAGNOSIS — N183 Chronic kidney disease, stage 3 unspecified: Secondary | ICD-10-CM | POA: Diagnosis not present

## 2020-01-29 DIAGNOSIS — I5042 Chronic combined systolic (congestive) and diastolic (congestive) heart failure: Secondary | ICD-10-CM | POA: Diagnosis not present

## 2020-02-12 ENCOUNTER — Ambulatory Visit: Payer: HMO | Admitting: Podiatry

## 2020-03-01 DIAGNOSIS — I5042 Chronic combined systolic (congestive) and diastolic (congestive) heart failure: Secondary | ICD-10-CM | POA: Diagnosis not present

## 2020-03-01 DIAGNOSIS — N183 Chronic kidney disease, stage 3 unspecified: Secondary | ICD-10-CM | POA: Diagnosis not present

## 2020-03-01 DIAGNOSIS — I480 Paroxysmal atrial fibrillation: Secondary | ICD-10-CM | POA: Diagnosis not present

## 2020-03-01 DIAGNOSIS — Z7901 Long term (current) use of anticoagulants: Secondary | ICD-10-CM | POA: Diagnosis not present

## 2020-03-11 ENCOUNTER — Other Ambulatory Visit: Payer: Self-pay | Admitting: Internal Medicine

## 2020-03-17 ENCOUNTER — Encounter (HOSPITAL_COMMUNITY): Payer: Self-pay | Admitting: Internal Medicine

## 2020-03-17 ENCOUNTER — Other Ambulatory Visit (HOSPITAL_COMMUNITY): Payer: Self-pay | Admitting: Internal Medicine

## 2020-03-17 MED ORDER — ISOSORBIDE MONONITRATE ER 30 MG PO TB24: ORAL_TABLET | ORAL | 1 refills | Status: DC

## 2020-03-17 MED ORDER — RIVAROXABAN 20 MG PO TABS: ORAL_TABLET | ORAL | 1 refills | Status: DC

## 2020-03-17 NOTE — Addendum Note (Signed)
Addended by: Benson Setting L on: 03/17/2020 09:47 AM   Modules accepted: Orders

## 2020-03-17 NOTE — Telephone Encounter (Signed)
This encounter was created in error - please disregard.

## 2020-03-22 ENCOUNTER — Telehealth (HOSPITAL_COMMUNITY): Payer: Self-pay | Admitting: Pharmacist

## 2020-03-22 MED ORDER — EPLERENONE 25 MG PO TABS: 12.5000 mg | ORAL_TABLET | Freq: Every day | ORAL | 3 refills | Status: DC

## 2020-03-22 NOTE — Telephone Encounter (Signed)
Eplerenone refill sent to pharmacy per patient request.

## 2020-03-29 ENCOUNTER — Ambulatory Visit (INDEPENDENT_AMBULATORY_CARE_PROVIDER_SITE_OTHER): Payer: HMO | Admitting: Emergency Medicine

## 2020-03-29 ENCOUNTER — Other Ambulatory Visit: Payer: Self-pay

## 2020-03-29 DIAGNOSIS — I472 Ventricular tachycardia, unspecified: Secondary | ICD-10-CM

## 2020-03-29 DIAGNOSIS — Z4502 Encounter for adjustment and management of automatic implantable cardiac defibrillator: Secondary | ICD-10-CM

## 2020-03-29 LAB — CUP PACEART INCLINIC DEVICE CHECK
Date Time Interrogation Session: 20210629144748
Implantable Lead Implant Date: 20150819
Implantable Lead Location: 753862
Implantable Lead Model: 3010
Implantable Pulse Generator Implant Date: 20150819
Pulse Gen Model: 1010
Pulse Gen Serial Number: 19497

## 2020-03-29 NOTE — Progress Notes (Signed)
Subcutaneous ICD battery check in clinic. No untreated episodes; no treated episodes; no shocks delivered. Electrode impedance status okay. No programming changes. Remaining longevity to ERI 7%. Next battery check (billable) on 05/31/20 per industry recommendation. Patient aware to call if auditory alert is heard in the interim.

## 2020-04-14 ENCOUNTER — Other Ambulatory Visit: Payer: Self-pay | Admitting: Nurse Practitioner

## 2020-05-02 ENCOUNTER — Other Ambulatory Visit: Payer: Self-pay

## 2020-05-02 NOTE — Patient Outreach (Signed)
  [facility]) Care Management Chronic Special Needs Program  05/02/2020  Name: Claudia [name] DOB: 1956/06/08  MRN: [id number]  Claudia [name] is enrolled in a chronic special needs plan for Heart Failure. Client called with no answer No answer and HIPAA compliant message left. 1st attempt Plan for 2nd outreach call in 3-4 days Chronic care management coordinator will attempt outreach in 3-4 days .   Peter Garter RN, Jackquline Denmark, CDE Chronic Care Management Coordinator [facility] Management [phone]

## 2020-05-04 ENCOUNTER — Other Ambulatory Visit: Payer: Self-pay

## 2020-05-04 NOTE — Patient Outreach (Signed)
  [facility] [facility]) Care Management Chronic Special Needs Program  05/04/2020  Name: Claudia [name] DOB: 10/01/1956  MRN: [id number]  Ms. Carlethia Mesquita is enrolled in a chronic special needs plan for  Heart Failure.  Client called with no answer and HIPAA compliant message left.  2rd attempt Client called back and states she is unable to speak complete call as she is in [place] and will not be back for several week.  Request call to review care plan later in the month Care plan due to be updated  RNCM will update care plan and will call to review with client later in the month  The client's individualized care plan was updated based on available data and  2021 Health Risk Assessment Goals Addressed              This Visit's Progress   .  COMPLETED: Client understands the importance of follow-up with providers by attending scheduled visits   On track     Keeping appointments Plan to keep scheduled appointments with providers Reinforced to keep scheduled appointments with providers Goal completed 05/04/20    .  Client will report no worsening of symptoms related to heart disease within the next 6 months(continue 05/04/20)   On track     Advised to notify doctor for symptoms of heart failure Call 911 for severe symptoms Weigh daily and record weighs Follow a low salt diet It is important to weigh daily and write down weights. Pay attention to your body if you have any shortness of breath, swelling in your feet, ankles, legs or your waistband gets tight.  Call your provider if you gain 3 pounds overnight or gain 5 pounds in a week.    .  Client will report weighing and recording weights daily within the next 6 months(continue 05/04/20) (pt-stated)   On track     It is important to weigh daily and write down weights. Pay attention to your body if you have any shortness of breath, swelling in your feet, ankles, legs or your waistband gets tight.  Call your provider if you gain 3 pounds  overnight or gain 5 pounds in a week.    .  COMPLETED: Maintain timely refills of Heart Failure medication as prescribed within the year    On track     Maintaining timely refills of medications per dispense report Reinforced importance of getting medications refilled on time Goal completed 05/04/20    .  COMPLETED: Obtain annual  Lipid Profile, LDL-C   On track     Completed 03/01/20 LDL 111 The goal for LDL is less than 70 mg/dL as you are at high risk for complications Try to avoid saturated fats, trans-fats and eat more fiber    .  Visit Primary Care Provider or Cardiologist at least 2 times per year   On track     Last visit with cardiology 01/22/20 Primary care appointment on 11/30/19 Please schedule your annual wellness visit        Plan:  . Send unsuccessful outreach letter with a copy of individualized care plan to client . Send individualized care plan to provider  Chronic care management coordinator will attempt outreach in 3-4 weeks  [place], [facility], [place] Management [phone]

## 2020-05-11 ENCOUNTER — Ambulatory Visit: Payer: Self-pay

## 2020-05-20 ENCOUNTER — Other Ambulatory Visit (HOSPITAL_COMMUNITY): Payer: Self-pay | Admitting: Internal Medicine

## 2020-05-23 DIAGNOSIS — H2513 Age-related nuclear cataract, bilateral: Secondary | ICD-10-CM | POA: Diagnosis not present

## 2020-05-23 DIAGNOSIS — H524 Presbyopia: Secondary | ICD-10-CM | POA: Diagnosis not present

## 2020-05-23 DIAGNOSIS — H52203 Unspecified astigmatism, bilateral: Secondary | ICD-10-CM | POA: Diagnosis not present

## 2020-05-23 DIAGNOSIS — H25013 Cortical age-related cataract, bilateral: Secondary | ICD-10-CM | POA: Diagnosis not present

## 2020-05-23 DIAGNOSIS — H5213 Myopia, bilateral: Secondary | ICD-10-CM | POA: Diagnosis not present

## 2020-05-30 ENCOUNTER — Other Ambulatory Visit: Payer: Self-pay

## 2020-05-30 NOTE — Patient Outreach (Signed)
  [place] [facility]) Care Management Chronic Special Needs Program  05/30/2020  Name: Claudia [name] DOB: 1956/09/18  MRN: [id number]  Ms. Claudia [name] is enrolled in a chronic special needs plan for Heart Failure. Reviewed and updated care plan.  Subjective: Client states that she has been doing very good.  States she had a good trip visiting her son.  States her weights have been very stable and she knows when to call her doctor. States she is weighing daily. States she is following a low sodium diet and she is eating more fruit.  States she is to have her defibrillator checked tomorrow.  States it's battery is about out and she will need to have it changed sometime soon.  States she is going to [employer] 3 times a week and she is there working out for 1-2 hours.  Denies any chest pains, shortness of breath or swelling.  States she has gotten both of her COVID shots.  States she does not wish to complete her Advanced Directives at this time. States she is affording her medications without any difficulty at this time  Goals Addressed              This Visit's Progress   .  Client will report no worsening of symptoms related to heart disease within the next 6 months(continue 05/30/20)   On track     No reports of worsening heart failure Reviewed signs and symptoms of heart failure Advised to notify doctor for symptoms of heart failure Call 911 for severe symptoms Weigh daily and record weighs Follow a low salt diet It is important to weigh daily and write down weights. Pay attention to your body if you have any shortness of breath, swelling in your feet, ankles, legs or your waistband gets tight.  Call your provider if you gain 3 pounds overnight or gain 5 pounds in a week.    .  Client will report weighing and recording weights daily within the next 6 months(continue 05/3020) (pt-stated)   On track     Reports weighing daily It is important to weigh daily and write down  weights. Pay attention to your body if you have any shortness of breath, swelling in your feet, ankles, legs or your waistband gets tight.  Call your provider if you gain 3 pounds overnight or gain 5 pounds in a week.    .  Visit Primary Care Provider or Cardiologist at least 2 times per year   On track     Last visit with cardiology 01/22/20 Primary care appointment on 11/30/19, 03/01/20 Please schedule your annual wellness visit       Plan:  Send successful outreach letter with a copy of their individualized care plan and Send individual care plan to provider  Client will be outreached by a [employer] ([facility]) RNCM in 6 months per tier level     Peter Garter RN, Jackquline Denmark, [place] Management Coordinator [place] Management [phone]

## 2020-05-31 ENCOUNTER — Ambulatory Visit (INDEPENDENT_AMBULATORY_CARE_PROVIDER_SITE_OTHER): Payer: HMO | Admitting: Emergency Medicine

## 2020-05-31 ENCOUNTER — Other Ambulatory Visit: Payer: Self-pay

## 2020-05-31 DIAGNOSIS — I472 Ventricular tachycardia, unspecified: Secondary | ICD-10-CM

## 2020-05-31 DIAGNOSIS — N182 Chronic kidney disease, stage 2 (mild): Secondary | ICD-10-CM | POA: Diagnosis not present

## 2020-05-31 DIAGNOSIS — I5042 Chronic combined systolic (congestive) and diastolic (congestive) heart failure: Secondary | ICD-10-CM | POA: Diagnosis not present

## 2020-05-31 DIAGNOSIS — I130 Hypertensive heart and chronic kidney disease with heart failure and stage 1 through stage 4 chronic kidney disease, or unspecified chronic kidney disease: Secondary | ICD-10-CM | POA: Diagnosis not present

## 2020-05-31 DIAGNOSIS — I480 Paroxysmal atrial fibrillation: Secondary | ICD-10-CM | POA: Diagnosis not present

## 2020-05-31 LAB — CUP PACEART INCLINIC DEVICE CHECK
Date Time Interrogation Session: 20210831145141
Implantable Lead Implant Date: 20150819
Implantable Lead Location: 753862
Implantable Lead Model: 3010
Implantable Pulse Generator Implant Date: 20150819
Pulse Gen Model: 1010
Pulse Gen Serial Number: 19497

## 2020-05-31 NOTE — Progress Notes (Signed)
Subcutaneous ICD check in clinic. 0 untreated episodes; 0 treated episodes; 0 shocks delivered.  No programming changes. Remaining longevity to ERI 5%.

## 2020-07-05 ENCOUNTER — Other Ambulatory Visit: Payer: Self-pay

## 2020-07-05 ENCOUNTER — Ambulatory Visit (INDEPENDENT_AMBULATORY_CARE_PROVIDER_SITE_OTHER): Payer: HMO | Admitting: Emergency Medicine

## 2020-07-05 DIAGNOSIS — Z95810 Presence of automatic (implantable) cardiac defibrillator: Secondary | ICD-10-CM

## 2020-07-05 NOTE — Progress Notes (Signed)
Subcutaneous ICD check in clinic. 0 untreated episodes; 0  treated episodes; 0 shocks delivered. Electrode impedance status okay. No programming changes. Remaining longevity to ERI 5%. Monthly in clinic battery check 08/07/20.

## 2020-07-05 NOTE — Patient Instructions (Signed)
Follow-up in 1 month for battery check.

## 2020-07-06 LAB — CUP PACEART INCLINIC DEVICE CHECK
Date Time Interrogation Session: 20211005142219
Implantable Lead Implant Date: 20150819
Implantable Lead Location: 753862
Implantable Lead Model: 3010
Implantable Pulse Generator Implant Date: 20150819
Pulse Gen Model: 1010
Pulse Gen Serial Number: 19497

## 2020-07-12 DIAGNOSIS — Z7901 Long term (current) use of anticoagulants: Secondary | ICD-10-CM | POA: Diagnosis not present

## 2020-07-12 DIAGNOSIS — I5042 Chronic combined systolic (congestive) and diastolic (congestive) heart failure: Secondary | ICD-10-CM | POA: Diagnosis not present

## 2020-07-12 DIAGNOSIS — N183 Chronic kidney disease, stage 3 unspecified: Secondary | ICD-10-CM | POA: Diagnosis not present

## 2020-07-12 DIAGNOSIS — I130 Hypertensive heart and chronic kidney disease with heart failure and stage 1 through stage 4 chronic kidney disease, or unspecified chronic kidney disease: Secondary | ICD-10-CM | POA: Diagnosis not present

## 2020-07-12 DIAGNOSIS — I480 Paroxysmal atrial fibrillation: Secondary | ICD-10-CM | POA: Diagnosis not present

## 2020-07-15 ENCOUNTER — Other Ambulatory Visit: Payer: Self-pay | Admitting: Internal Medicine

## 2020-07-28 ENCOUNTER — Other Ambulatory Visit (HOSPITAL_COMMUNITY): Payer: Self-pay | Admitting: Internal Medicine

## 2020-07-30 DIAGNOSIS — N182 Chronic kidney disease, stage 2 (mild): Secondary | ICD-10-CM | POA: Diagnosis not present

## 2020-07-30 DIAGNOSIS — I480 Paroxysmal atrial fibrillation: Secondary | ICD-10-CM | POA: Diagnosis not present

## 2020-07-30 DIAGNOSIS — I130 Hypertensive heart and chronic kidney disease with heart failure and stage 1 through stage 4 chronic kidney disease, or unspecified chronic kidney disease: Secondary | ICD-10-CM | POA: Diagnosis not present

## 2020-07-30 DIAGNOSIS — I5042 Chronic combined systolic (congestive) and diastolic (congestive) heart failure: Secondary | ICD-10-CM | POA: Diagnosis not present

## 2020-08-09 ENCOUNTER — Ambulatory Visit (INDEPENDENT_AMBULATORY_CARE_PROVIDER_SITE_OTHER): Payer: HMO | Admitting: Emergency Medicine

## 2020-08-09 ENCOUNTER — Other Ambulatory Visit: Payer: Self-pay

## 2020-08-09 DIAGNOSIS — I4729 Other ventricular tachycardia: Secondary | ICD-10-CM

## 2020-08-09 DIAGNOSIS — I472 Ventricular tachycardia: Secondary | ICD-10-CM

## 2020-08-09 LAB — CUP PACEART INCLINIC DEVICE CHECK
Date Time Interrogation Session: 20211109141458
Implantable Lead Implant Date: 20150819
Implantable Lead Location: 753862
Implantable Lead Model: 3010
Implantable Pulse Generator Implant Date: 20150819
Pulse Gen Model: 1010
Pulse Gen Serial Number: 19497

## 2020-08-09 NOTE — Progress Notes (Signed)
Subcutaneous ICD battery check in clinic. 0 untreated episodes; 0 treated episodes; 0 shocks delivered. . No programming changes. Remaining longevity to ERI 2%.  Next apt. made 09/08/20. See scanned report.

## 2020-08-11 ENCOUNTER — Other Ambulatory Visit: Payer: Self-pay | Admitting: Internal Medicine

## 2020-08-12 ENCOUNTER — Ambulatory Visit (HOSPITAL_COMMUNITY)
Admission: RE | Admit: 2020-08-12 | Discharge: 2020-08-12 | Disposition: A | Discharge status: Home / Self Care | Payer: HMO | Source: Ambulatory Visit | Attending: Internal Medicine | Admitting: Internal Medicine

## 2020-08-12 ENCOUNTER — Other Ambulatory Visit: Payer: Self-pay

## 2020-08-12 ENCOUNTER — Encounter (HOSPITAL_COMMUNITY): Payer: Self-pay | Admitting: Internal Medicine

## 2020-08-12 VITALS — BP 142/80 | HR 82 | Wt 216.6 lb

## 2020-08-12 DIAGNOSIS — Z9119 Patient's noncompliance with other medical treatment and regimen: Secondary | ICD-10-CM | POA: Diagnosis not present

## 2020-08-12 DIAGNOSIS — I110 Hypertensive heart disease with heart failure: Secondary | ICD-10-CM | POA: Diagnosis not present

## 2020-08-12 DIAGNOSIS — I10 Essential (primary) hypertension: Secondary | ICD-10-CM | POA: Diagnosis not present

## 2020-08-12 DIAGNOSIS — I472 Ventricular tachycardia, unspecified: Secondary | ICD-10-CM

## 2020-08-12 DIAGNOSIS — E785 Hyperlipidemia, unspecified: Secondary | ICD-10-CM | POA: Diagnosis not present

## 2020-08-12 DIAGNOSIS — I5022 Chronic systolic (congestive) heart failure: Secondary | ICD-10-CM

## 2020-08-12 DIAGNOSIS — Z95810 Presence of automatic (implantable) cardiac defibrillator: Secondary | ICD-10-CM | POA: Diagnosis not present

## 2020-08-12 DIAGNOSIS — G4733 Obstructive sleep apnea (adult) (pediatric): Secondary | ICD-10-CM | POA: Diagnosis not present

## 2020-08-12 DIAGNOSIS — Z87891 Personal history of nicotine dependence: Secondary | ICD-10-CM | POA: Insufficient documentation

## 2020-08-12 DIAGNOSIS — I480 Paroxysmal atrial fibrillation: Secondary | ICD-10-CM | POA: Insufficient documentation

## 2020-08-12 DIAGNOSIS — Z86718 Personal history of other venous thrombosis and embolism: Secondary | ICD-10-CM | POA: Insufficient documentation

## 2020-08-12 DIAGNOSIS — Z853 Personal history of malignant neoplasm of breast: Secondary | ICD-10-CM | POA: Insufficient documentation

## 2020-08-12 DIAGNOSIS — Z7901 Long term (current) use of anticoagulants: Secondary | ICD-10-CM | POA: Insufficient documentation

## 2020-08-12 DIAGNOSIS — Z79899 Other long term (current) drug therapy: Secondary | ICD-10-CM | POA: Diagnosis not present

## 2020-08-12 LAB — BASIC METABOLIC PANEL
Anion gap: 10 (ref 5–15)
BUN: 12 mg/dL (ref 8–23)
CO2: 23 mmol/L (ref 22–32)
Calcium: 9.3 mg/dL (ref 8.9–10.3)
Chloride: 104 mmol/L (ref 98–111)
Creatinine, Ser: 0.99 mg/dL (ref 0.44–1.00)
GFR, Estimated: >60 mL/min (ref >60)
Glucose, Bld: 97 mg/dL (ref 70–99)
Potassium: 3.9 mmol/L (ref 3.5–5.1)
Sodium: 137 mmol/L (ref 135–145)

## 2020-08-12 LAB — CBC
HCT: 40.1 % (ref 36.0–46.0)
Hemoglobin: 12.8 g/dL (ref 12.0–15.0)
MCH: 26.8 pg (ref 26.0–34.0)
MCHC: 31.9 g/dL (ref 30.0–36.0)
MCV: 84.1 fL (ref 80.0–100.0)
Platelets: 314 10*3/uL (ref 150–400)
RBC: 4.77 MIL/uL (ref 3.87–5.11)
RDW: 13.5 % (ref 11.5–15.5)
WBC: 3.5 10*3/uL — ABNORMAL LOW (ref 4.0–10.5)
nRBC: 0 % (ref 0.0–0.2)

## 2020-08-12 MED ORDER — ENTRESTO 97-103 MG PO TABS: 1.0000 | ORAL_TABLET | Freq: Two times a day (BID) | ORAL | 3 refills | Status: DC

## 2020-08-12 NOTE — Patient Instructions (Signed)
INCREASE Entresto 97/103mg  (1 tablet) twice daily  Labs done today, your results will be available in MyChart, we will contact you for abnormal readings.  Your physician has requested that you have an echocardiogram. Echocardiography is a painless test that uses sound waves to create images of your heart. It provides your doctor with information about the size and shape of your heart and how well your heart's chambers and valves are working. This procedure takes approximately one hour. There are no restrictions for this procedure.  Your physician has recommended that you have a sleep study. This test records several body functions during sleep, including: brain activity, eye movement, oxygen and carbon dioxide blood levels, heart rate and rhythm, breathing rate and rhythm, the flow of air through your mouth and nose, snoring, body muscle movements, and chest and belly movement. ONCE WE APPROVE WITH INSURANCE, WE WILL CONTACT YOU  Please call our office in May 2022 to schedule your follow up appointment  If you have any questions or concerns before your next appointment please send [place] a message through [place] or call our office at [phone].    TO LEAVE A MESSAGE FOR THE NURSE SELECT OPTION 2, PLEASE LEAVE A MESSAGE INCLUDING: . YOUR NAME . DATE OF BIRTH . CALL BACK NUMBER . REASON FOR CALL**this is important as we prioritize the call backs  YOU WILL RECEIVE A CALL BACK THE SAME DAY AS LONG AS YOU CALL BEFORE 4:00 PM

## 2020-08-12 NOTE — Progress Notes (Signed)
Patient ID: Claudia [name], female   DOB: Sep 03, 1956, 64 y.o.   MRN: [id number]  Primary Cardiologist: Dr Haroldine Laws PCP: Dr Criss Rosales  Oncology: Dr Lindi Adie.  EP: Dr Lynelle Doctor  HPI: [name] is a 64 y/o woman with h/o breast cancer in 5284 and systolic HF due to chemotoxicity. She also h/o PAF and previous DVT for which she is on chronic anticoagulation. She moved from [place] in 07/2014 and saw Dr. Johnsie Cancel.   She developed breast cancer in 2008 and underwent chemotherapy and in the wake of this, she developed congestive heart failure with EF 20%. Cardiac cath with normal coronaries. She received an ICD It became infected in the spring of 2015 with staph and she underwent extraction. She has undergone S ICD implantation following prolonged use of a LifeVest. She was ultimately transferred to [facility] in [place] in 01/2014 for consideration of heart transplantation. They diuresed her over 60 pounds and she improved markedly.   March 2017 after  ICD fired x3. Device was reprogrammed and bb was increased 18.75 mg twice a day. ECHO was repeated EF 45%. Dischaege weight 184 pounds.   Echo 8/20 EF 35-40%  Today she returns for HF follow up. Overall feeling well. Going to classes at the gym 3x/week, line dancing, cardio. No CP, SOB, edema, or orthopnea/PND. Has not taken BP meds today but home checks average 130/70. Watches salt in diet, weighs at home averages 214 lbs. Says she snores at night. Has not worn CPAP in some time.  ECHO 11/2015: EF 45% Grade I DD ECHO 10/07/14 and EF 40-45% with normal RV.   Labs: 08/24/14: Potassium 4.2 Creatinine 1.07 hgb 13.1 01/31/2015: K 4.0 Creatinine 1.28   ROS: All systems negative except as listed in HPI, PMH and Problem List.  SH:  Social History   Socioeconomic History  . Marital status: Divorced    Spouse name: Not on file  . Number of children: Not on file  . Years of education: Not on file  . Highest education level: Not on file  Occupational History  . Not on file    Tobacco Use  . Smoking status: Former Research scientist (life sciences)  . Smokeless tobacco: Never Used  Vaping Use  . Vaping Use: Never used  Substance and Sexual Activity  . Alcohol use: No  . Drug use: No  . Sexual activity: Not on file  Other Topics Concern  . Not on file  Social History Narrative  . Not on file   Social Determinants of Health   Financial Resource Strain:   . Difficulty of Paying Living Expenses: Not on file  Food Insecurity: No Food Insecurity  . Worried About Charity fundraiser in the Last Year: Never true  . Ran Out of Food in the Last Year: Never true  Transportation Needs: No Transportation Needs  . Lack of Transportation (Medical): No  . Lack of Transportation (Non-Medical): No  Physical Activity:   . Days of Exercise per Week: Not on file  . Minutes of Exercise per Session: Not on file  Stress:   . Feeling of Stress : Not on file  Social Connections:   . Frequency of Communication with Friends and Family: Not on file  . Frequency of Social Gatherings with Friends and Family: Not on file  . Attends Religious Services: Not on file  . Active Member of Clubs or Organizations: Not on file  . Attends Archivist Meetings: Not on file  . Marital Status: Not on file  Intimate Partner Violence:   . Fear of Current or Ex-Partner: Not on file  . Emotionally Abused: Not on file  . Physically Abused: Not on file  . Sexually Abused: Not on file    FH:  Family History  Problem Relation Age of Onset  . Diabetes Mother   . Cancer Father     Past Medical History:  Diagnosis Date  . AICD (automatic cardioverter/defibrillator) present 09/15/2014  . Breast cancer ([place])   . CHF (congestive heart failure) ([place])   . Implantable cardioverter-defibrillator infection  s/p extraction 10/07/2014    Current Outpatient Medications  Medication Sig Dispense Refill  . acetaminophen (TYLENOL) 325 MG tablet Take 650 mg by mouth every 6 (six) hours as needed.    . carvedilol  (COREG) 25 MG tablet Take 1 tablet by mouth twice a day with meals 180 tablet 2  . cyclobenzaprine (FLEXERIL) 5 MG tablet Take 5 mg by mouth at bedtime.   2  . eplerenone (INSPRA) 25 MG tablet Take 0.5 tablets (12.5 mg total) by mouth daily. 45 tablet 3  . furosemide (LASIX) 40 MG tablet Take 40 mg by mouth daily.    . hydrALAZINE (APRESOLINE) 25 MG tablet Take 1.5 tablets (37.5 mg total) by mouth 3 (three) times daily. 405 tablet 3  . isosorbide mononitrate (IMDUR) 30 MG 24 hr tablet Take 1 tablet by mouth nightly 90 tablet 0  . rosuvastatin (CRESTOR) 5 MG tablet Take 5 mg by mouth at bedtime.    . sacubitril-valsartan (ENTRESTO) 49-51 MG Take 1 tablet by mouth 2 (two) times daily. 60 tablet 6  . XARELTO 20 MG TABS tablet Take 1 tablet by mouth once daily with supper 90 tablet 0   No current facility-administered medications for this encounter.    Vitals:   08/12/20 1349  BP: (!) 142/80  Pulse: 82  SpO2: 99%  Weight: 98.2 kg (216 lb 9.6 oz)    PHYSICAL EXAM:  General:  Well appearing. No resp difficulty HEENT: normal Neck: supple. no JVD. Carotids 2+ bilat; no bruits. No lymphadenopathy or thryomegaly appreciated. Cor: PMI nondisplaced. Regular rate & rhythm. No rubs, gallops or murmurs. Lungs: clear, diminished in bilateral bases Abdomen: soft, nontender, nondistended. No hepatosplenomegaly. No bruits or masses. Good bowel sounds. Extremities: no cyanosis, clubbing, rash, edema Neuro: alert & orientedx3, cranial nerves grossly intact. moves all 4 extremities w/o difficulty. Affect pleasant   ASSESSMENT & PLAN:  1. Chronic systolic HF  - likely due to chemotoxicity from breast CA. EF in January 2016 40-45% .  - Cath normal in [place].  - s/p ICD - Echo 8/20 EF 35-40%. - Continues to do well. NYHA I-II. Volume status ok. BP elevated (did not take meds yet today) - Continue current carvedilol 25 mg bid. - Continue Imdur 30 mg  - Continue hydralazine 37.5 mg TID - Increase  Entresto to 97/103 BID - Continue Inspra 25 mg daily - BMET & CBC today - Repeat Echo  2. Breast CA  - s/p treatment 2008. No evidence of recurrence - per patient, no follow up needed  3. Paroxysmal AF  - In NSR today. On Xarelto no bleeding   4. H/o DVT - on Xarelto . No bleeding problems. Continue lifelong   5. Hyperlipidemia  - on crestor. Followed by Dr. Criss Rosales  6. H/O VT ICD discharge x3 11/2015. - ICD interrogated personally in clinic. VT quiescent - Device near EOL, ERI 2% on 08/09/20. Following with Dr. Caryl Comes  7. HTN -  BP not fully at goal. Will increase Entresto to 97/103 BID - Continue home BP checks  8. OSA - wore CPAP in past but has not worn in years - Set up for Home Sleep Study  Glori Bickers MD 2:28 PM

## 2020-08-14 NOTE — Addendum Note (Signed)
Encounter addended by: Jolaine Artist, MD on: 08/14/2020 3:00 PM  Actions taken: Level of Service modified, Visit diagnoses modified, Charge Capture section accepted

## 2020-08-17 DIAGNOSIS — Z23 Encounter for immunization: Secondary | ICD-10-CM | POA: Diagnosis not present

## 2020-08-22 ENCOUNTER — Ambulatory Visit (HOSPITAL_COMMUNITY)
Admission: RE | Admit: 2020-08-22 | Discharge: 2020-08-22 | Disposition: A | Discharge status: Home / Self Care | Payer: HMO | Source: Ambulatory Visit | Attending: Internal Medicine | Admitting: Internal Medicine

## 2020-08-22 ENCOUNTER — Other Ambulatory Visit: Payer: Self-pay

## 2020-08-22 DIAGNOSIS — I080 Rheumatic disorders of both mitral and aortic valves: Secondary | ICD-10-CM | POA: Diagnosis not present

## 2020-08-22 DIAGNOSIS — I5022 Chronic systolic (congestive) heart failure: Secondary | ICD-10-CM | POA: Insufficient documentation

## 2020-08-22 LAB — ECHOCARDIOGRAM COMPLETE
Area-P 1/2: 5.32 cm2
S' Lateral: 4.3 cm

## 2020-08-22 NOTE — Progress Notes (Signed)
  Echocardiogram 2D Echocardiogram has been performed.  Claudia [name] 08/22/2020, 2:46 PM

## 2020-08-30 DIAGNOSIS — I5042 Chronic combined systolic (congestive) and diastolic (congestive) heart failure: Secondary | ICD-10-CM | POA: Diagnosis not present

## 2020-08-30 DIAGNOSIS — I480 Paroxysmal atrial fibrillation: Secondary | ICD-10-CM | POA: Diagnosis not present

## 2020-08-30 DIAGNOSIS — N182 Chronic kidney disease, stage 2 (mild): Secondary | ICD-10-CM | POA: Diagnosis not present

## 2020-08-30 DIAGNOSIS — I130 Hypertensive heart and chronic kidney disease with heart failure and stage 1 through stage 4 chronic kidney disease, or unspecified chronic kidney disease: Secondary | ICD-10-CM | POA: Diagnosis not present

## 2020-08-31 ENCOUNTER — Other Ambulatory Visit: Payer: Self-pay

## 2020-08-31 NOTE — Patient Outreach (Signed)
  [facility]) Care Management Chronic Special Needs Program    08/31/2020  Name: Claudia [name], DOB: June 10, 1956  MRN: [id number]   Ms. Leannah Guse is enrolled in a chronic special needs plan for Heart Failure.  [facility] Management will continue to provide services for this client through 09/30/2020. The [employer] care management team will assume care 10/01/2020.  Peter Garter RN, Jackquline Denmark, CDE Chronic Care Management Coordinator [facility] [phone]

## 2020-09-08 ENCOUNTER — Other Ambulatory Visit: Payer: Self-pay

## 2020-09-08 ENCOUNTER — Ambulatory Visit (INDEPENDENT_AMBULATORY_CARE_PROVIDER_SITE_OTHER): Payer: HMO | Admitting: Emergency Medicine

## 2020-09-08 DIAGNOSIS — I428 Other cardiomyopathies: Secondary | ICD-10-CM

## 2020-09-08 LAB — CUP PACEART INCLINIC DEVICE CHECK
Date Time Interrogation Session: 20211209144330
Implantable Lead Implant Date: 20150819
Implantable Lead Location: 753862
Implantable Lead Model: 3010
Implantable Pulse Generator Implant Date: 20150819
Pulse Gen Model: 1010
Pulse Gen Serial Number: 19497

## 2020-09-08 NOTE — Progress Notes (Signed)
Subcutaneous ICD check in clinic with industry. 0 untreated episodes; 0 treated episodes; 0 shocks delivered. Electrode impedance status okay. Auditory alert tone demonstrated with use of magnet.  No programming changes. Remaining longevity to ERI 2%.  At industry recommendation pt should have next visit with MD to discuss generator changeout, message sent to scheduling.

## 2020-09-30 DIAGNOSIS — N182 Chronic kidney disease, stage 2 (mild): Secondary | ICD-10-CM | POA: Diagnosis not present

## 2020-09-30 DIAGNOSIS — I130 Hypertensive heart and chronic kidney disease with heart failure and stage 1 through stage 4 chronic kidney disease, or unspecified chronic kidney disease: Secondary | ICD-10-CM | POA: Diagnosis not present

## 2020-09-30 DIAGNOSIS — I480 Paroxysmal atrial fibrillation: Secondary | ICD-10-CM | POA: Diagnosis not present

## 2020-09-30 DIAGNOSIS — I5042 Chronic combined systolic (congestive) and diastolic (congestive) heart failure: Secondary | ICD-10-CM | POA: Diagnosis not present

## 2020-10-03 ENCOUNTER — Other Ambulatory Visit (HOSPITAL_COMMUNITY): Payer: Self-pay | Admitting: Internal Medicine

## 2020-10-04 ENCOUNTER — Other Ambulatory Visit: Payer: Self-pay

## 2020-10-14 ENCOUNTER — Ambulatory Visit (INDEPENDENT_AMBULATORY_CARE_PROVIDER_SITE_OTHER): Payer: HMO | Admitting: Internal Medicine

## 2020-10-14 ENCOUNTER — Encounter: Payer: Self-pay | Admitting: Internal Medicine

## 2020-10-14 ENCOUNTER — Other Ambulatory Visit: Payer: Self-pay

## 2020-10-14 VITALS — BP 128/76 | HR 72 | Ht 66.0 in | Wt 224.4 lb

## 2020-10-14 DIAGNOSIS — I428 Other cardiomyopathies: Secondary | ICD-10-CM | POA: Diagnosis not present

## 2020-10-14 DIAGNOSIS — I480 Paroxysmal atrial fibrillation: Secondary | ICD-10-CM | POA: Diagnosis not present

## 2020-10-14 DIAGNOSIS — I5022 Chronic systolic (congestive) heart failure: Secondary | ICD-10-CM

## 2020-10-14 DIAGNOSIS — Z95810 Presence of automatic (implantable) cardiac defibrillator: Secondary | ICD-10-CM | POA: Diagnosis not present

## 2020-10-14 LAB — CUP PACEART INCLINIC DEVICE CHECK
Date Time Interrogation Session: 20220114131522
Implantable Lead Implant Date: 20150819
Implantable Lead Location: 753862
Implantable Lead Model: 3010
Implantable Pulse Generator Implant Date: 20150819
Pulse Gen Model: 1010
Pulse Gen Serial Number: 19497

## 2020-10-14 NOTE — Patient Instructions (Signed)
Medication Instructions:  Your physician recommends that you continue on your current medications as directed. Please refer to the Current Medication list given to you today.  *If you need a refill on your cardiac medications before your next appointment, please call your pharmacy*   Lab Work: None ordered.  If you have labs (blood work) drawn today and your tests are completely normal, you will receive your results only by: Marland Kitchen MyChart Message (if you have MyChart) OR . A paper copy in the mail If you have any lab test that is abnormal or we need to change your treatment, we will call you to review the results.   Testing/Procedure:  Your physician has recommended that you have a defibrillator generator change.  An implantable cardioverter defibrillator (ICD) is a small device that is placed in your chest or, in rare cases, your abdomen. This device uses electrical pulses or shocks to help control life-threatening, irregular heartbeats that could lead the heart to suddenly stop beating (sudden cardiac arrest). Leads are attached to the ICD that goes into your heart. This is done in the hospital and usually requires an overnight stay. Please see the instruction sheet given to you today for more information.     Follow-Up: At [facility], you and your health needs are our priority.  As part of our continuing mission to provide you with exceptional heart care, we have created designated Provider Care Teams.  These Care Teams include your primary Cardiologist (physician) and Advanced Practice Providers (APPs -  Physician Assistants and Nurse Practitioners) who all work together to provide you with the care you need, when you need it.  We recommend signing up for the patient portal called "MyChart".  Sign up information is provided on this After Visit Summary.  MyChart is used to connect with patients for Virtual Visits (Telemedicine).  Patients are able to view lab/test results, encounter  notes, upcoming appointments, etc.  Non-urgent messages can be sent to your provider as well.   To learn more about what you can do with MyChart, go to [email or web address].    Your next appointment:   To be scheduled

## 2020-10-14 NOTE — Progress Notes (Signed)
Patient Care Team: Iona Beard, MD as PCP - General (Family Medicine) Nicholas Lose, MD as Consulting Physician (Hematology and Oncology) Bensimhon, Shaune Pascal, MD as Consulting Physician (Cardiology)   HPI  Claudia [name] is a 65 y.o. female seen in follow-up for SQ ICD implanted for primary prevention for nonischemic cardiomyopathy. She had previously implanted transvenous device removed because of infection.   Appropriate ICD therapy 3/17.  This occurred inappropriately, and that she had slow VT below detection with some under sensing, followed by over sensing and then shock.  Her device was reprogrammed to increase her detection rate to 250 bpm to decrease inappropriate shocks.  She subsequently had shocks for T wave oversensing requiring further reprogramming    Beta blockers were increased;    No interval palpitations, shortness of breath or chest pain.  DATE TEST EF   4/15 Echo   15-20 %   8/20 Echo   35-40 %   11/21 Echo  35-40%    Date Cr K Hgb  10/20 1.05 4.1 12.4  11/21  0.99 3.9 85.4    Thromboembolic risk factors ( age  -2, HTN-1, CHF-1, Gender-1) for a CHADSVASc Score of >=5     Records and Results Reviewed   Past Medical History:  Diagnosis Date  . AICD (automatic cardioverter/defibrillator) present 09/15/2014  . Breast cancer ([place])   . CHF (congestive heart failure) ([place])   . 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 CHOLECYSTECTOMY    . device removal     defib  . fiber tumor    . masectomy      Current Outpatient Medications  Medication Sig Dispense Refill  . acetaminophen (TYLENOL) 325 MG tablet Take 650 mg by mouth every 6 (six) hours as needed.    . carvedilol (COREG) 25 MG tablet Take 1 tablet by mouth twice a day with meals 180 tablet 2  . cyclobenzaprine (FLEXERIL) 5 MG tablet Take 5 mg by mouth at bedtime.   2  . eplerenone (INSPRA) 25 MG tablet  Take 0.5 tablets (12.5 mg total) by mouth daily. 45 tablet 3  . furosemide (LASIX) 40 MG tablet Take 40 mg by mouth daily.    . hydrALAZINE (APRESOLINE) 25 MG tablet Take 1 & 1/2 tablets (37.5 mg) by mouth 3  times daily. 405 tablet 2  . isosorbide mononitrate (IMDUR) 30 MG 24 hr tablet Take 1 tablet by mouth nightly 90 tablet 0  . rosuvastatin (CRESTOR) 5 MG tablet Take 5 mg by mouth at bedtime.    . sacubitril-valsartan (ENTRESTO) 97-103 MG Take 1 tablet by mouth 2 (two) times daily. 60 tablet 3  . XARELTO 20 MG TABS tablet Take 1 tablet by mouth once daily with supper 90 tablet 0   No current facility-administered medications for this visit.    No Known Allergies    Review of Systems negative except from HPI and PMH  Physical Exam BP 128/76   Pulse 72   Ht 5\' 6"  (1.676 m)   Wt 224 lb 6.4 oz (101.8 kg)   SpO2 98%   BMI 36.22 kg/m  Well developed and well nourished in no acute distress HENT normal Neck supple with JVP-flat Clear Device pocket well healed; without hematoma or erythema.  There is no tethering  Regular rate and rhythm, no   murmur Abd-soft with active BS No Clubbing cyanosis  edema Skin-warm and dry A &  Oriented  Grossly normal sensory and motor function  ECG sinus @ 72 18/09/40   Assessment and  Plan Nonischemic cardiomyopathy  S ICD      Ventricular tachycardia- slow   Atrial fibrillation-paroxysmal  DVT  Sleep disordered breathing  CHF chronic Systolic     Device is reached ERI. We have reviewed the benefits and risks of generator replacement.  These include but are not limited to lead fracture and infection.  The patient understands, agrees and is willing to proceed.   We will plan to do with general anesthesia support given the location.  Euvolemic continue current meds  No intercurrent atrial fibrillation or flutter  On Anticoagulation;  No bleeding issues

## 2020-10-20 ENCOUNTER — Encounter: Payer: HMO | Admitting: Internal Medicine

## 2020-10-25 DIAGNOSIS — Z01812 Encounter for preprocedural laboratory examination: Secondary | ICD-10-CM

## 2020-10-25 DIAGNOSIS — I5022 Chronic systolic (congestive) heart failure: Secondary | ICD-10-CM

## 2020-10-25 DIAGNOSIS — Z4502 Encounter for adjustment and management of automatic implantable cardiac defibrillator: Secondary | ICD-10-CM

## 2020-10-25 DIAGNOSIS — I428 Other cardiomyopathies: Secondary | ICD-10-CM

## 2020-10-29 DIAGNOSIS — I480 Paroxysmal atrial fibrillation: Secondary | ICD-10-CM | POA: Diagnosis not present

## 2020-10-29 DIAGNOSIS — I130 Hypertensive heart and chronic kidney disease with heart failure and stage 1 through stage 4 chronic kidney disease, or unspecified chronic kidney disease: Secondary | ICD-10-CM | POA: Diagnosis not present

## 2020-10-29 DIAGNOSIS — N182 Chronic kidney disease, stage 2 (mild): Secondary | ICD-10-CM | POA: Diagnosis not present

## 2020-10-29 DIAGNOSIS — I5042 Chronic combined systolic (congestive) and diastolic (congestive) heart failure: Secondary | ICD-10-CM | POA: Diagnosis not present

## 2020-11-01 ENCOUNTER — Telehealth: Payer: Self-pay

## 2020-11-01 NOTE — Telephone Encounter (Signed)
Spoke with pt and reviewed the device instruction letter.  See letter for complete details.  Pt verbalized understanding and agrees with current plan.

## 2020-11-01 NOTE — Telephone Encounter (Signed)
Attempted phone call to pt to review generator change out instructions.  Left voicemail message to contact RN at [phone].

## 2020-11-15 DIAGNOSIS — I480 Paroxysmal atrial fibrillation: Secondary | ICD-10-CM | POA: Diagnosis not present

## 2020-11-15 DIAGNOSIS — Z95810 Presence of automatic (implantable) cardiac defibrillator: Secondary | ICD-10-CM | POA: Diagnosis not present

## 2020-11-15 DIAGNOSIS — N183 Chronic kidney disease, stage 3 unspecified: Secondary | ICD-10-CM | POA: Diagnosis not present

## 2020-11-15 DIAGNOSIS — Z7901 Long term (current) use of anticoagulants: Secondary | ICD-10-CM | POA: Diagnosis not present

## 2020-11-15 DIAGNOSIS — I427 Cardiomyopathy due to drug and external agent: Secondary | ICD-10-CM | POA: Diagnosis not present

## 2020-11-15 DIAGNOSIS — I5042 Chronic combined systolic (congestive) and diastolic (congestive) heart failure: Secondary | ICD-10-CM | POA: Diagnosis not present

## 2020-11-22 ENCOUNTER — Other Ambulatory Visit (HOSPITAL_COMMUNITY)
Admission: RE | Admit: 2020-11-22 | Discharge: 2020-11-22 | Disposition: A | Discharge status: Home / Self Care | Payer: HMO | Source: Ambulatory Visit | Attending: Internal Medicine | Admitting: Internal Medicine

## 2020-11-22 ENCOUNTER — Other Ambulatory Visit: Payer: HMO | Admitting: *Deleted

## 2020-11-22 ENCOUNTER — Other Ambulatory Visit: Payer: Self-pay

## 2020-11-22 DIAGNOSIS — Z20822 Contact with and (suspected) exposure to covid-19: Secondary | ICD-10-CM | POA: Insufficient documentation

## 2020-11-22 DIAGNOSIS — I428 Other cardiomyopathies: Secondary | ICD-10-CM

## 2020-11-22 DIAGNOSIS — I5022 Chronic systolic (congestive) heart failure: Secondary | ICD-10-CM | POA: Diagnosis not present

## 2020-11-22 DIAGNOSIS — Z01812 Encounter for preprocedural laboratory examination: Secondary | ICD-10-CM | POA: Insufficient documentation

## 2020-11-22 DIAGNOSIS — Z4502 Encounter for adjustment and management of automatic implantable cardiac defibrillator: Secondary | ICD-10-CM

## 2020-11-22 LAB — SARS CORONAVIRUS 2 (TAT 6-24 HRS): SARS Coronavirus 2: NEGATIVE

## 2020-11-23 ENCOUNTER — Other Ambulatory Visit (HOSPITAL_COMMUNITY): Payer: HMO

## 2020-11-23 ENCOUNTER — Other Ambulatory Visit: Payer: HMO

## 2020-11-23 LAB — CBC
Hematocrit: 38.7 % (ref 34.0–46.6)
Hemoglobin: 12.9 g/dL (ref 11.1–15.9)
MCH: 27.6 pg (ref 26.6–33.0)
MCHC: 33.3 g/dL (ref 31.5–35.7)
MCV: 83 fL (ref 79–97)
Platelets: 313 10*3/uL (ref 150–450)
RBC: 4.68 x10E6/uL (ref 3.77–5.28)
RDW: 13.1 % (ref 11.7–15.4)
WBC: 4.6 10*3/uL (ref 3.4–10.8)

## 2020-11-23 LAB — BASIC METABOLIC PANEL
BUN/Creatinine Ratio: 13 (ref 12–28)
BUN: 15 mg/dL (ref 8–27)
CO2: 23 mmol/L (ref 20–29)
Calcium: 9.2 mg/dL (ref 8.7–10.3)
Chloride: 99 mmol/L (ref 96–106)
Creatinine, Ser: 1.15 mg/dL — ABNORMAL HIGH (ref 0.57–1.00)
GFR calc Af Amer: 58 mL/min/{1.73_m2} — ABNORMAL LOW (ref >59)
GFR calc non Af Amer: 50 mL/min/{1.73_m2} — ABNORMAL LOW (ref >59)
Glucose: 83 mg/dL (ref 65–99)
Potassium: 4.1 mmol/L (ref 3.5–5.2)
Sodium: 136 mmol/L (ref 134–144)

## 2020-11-24 NOTE — Progress Notes (Signed)
Instructed patient on the following items: Arrival time 0530 Nothing to eat or drink after midnight No meds AM of procedure Responsible person to drive you home and stay with you for 24 hrs Wash with special soap night before and morning of procedure If on anti-coagulant drug instructions Xarelto, none today, last dose 11/23/20

## 2020-11-25 ENCOUNTER — Ambulatory Visit (HOSPITAL_COMMUNITY)
Admission: RE | Admit: 2020-11-25 | Discharge: 2020-11-25 | Disposition: A | Discharge status: Home / Self Care | Payer: HMO | Attending: Internal Medicine | Admitting: Internal Medicine

## 2020-11-25 ENCOUNTER — Encounter (HOSPITAL_COMMUNITY)
Admission: RE | Disposition: A | Discharge status: Home / Self Care | Payer: Self-pay | Source: Home / Self Care | Attending: Internal Medicine

## 2020-11-25 ENCOUNTER — Other Ambulatory Visit: Payer: Self-pay

## 2020-11-25 ENCOUNTER — Ambulatory Visit (HOSPITAL_COMMUNITY): Payer: HMO | Admitting: Certified Registered"

## 2020-11-25 ENCOUNTER — Encounter (HOSPITAL_COMMUNITY): Payer: Self-pay | Admitting: Internal Medicine

## 2020-11-25 DIAGNOSIS — G4730 Sleep apnea, unspecified: Secondary | ICD-10-CM | POA: Insufficient documentation

## 2020-11-25 DIAGNOSIS — Z9010 Acquired absence of unspecified breast and nipple: Secondary | ICD-10-CM | POA: Diagnosis not present

## 2020-11-25 DIAGNOSIS — I5022 Chronic systolic (congestive) heart failure: Secondary | ICD-10-CM | POA: Diagnosis not present

## 2020-11-25 DIAGNOSIS — Z7901 Long term (current) use of anticoagulants: Secondary | ICD-10-CM | POA: Insufficient documentation

## 2020-11-25 DIAGNOSIS — Z79899 Other long term (current) drug therapy: Secondary | ICD-10-CM | POA: Diagnosis not present

## 2020-11-25 DIAGNOSIS — I429 Cardiomyopathy, unspecified: Secondary | ICD-10-CM | POA: Diagnosis not present

## 2020-11-25 DIAGNOSIS — I472 Ventricular tachycardia: Secondary | ICD-10-CM | POA: Diagnosis not present

## 2020-11-25 DIAGNOSIS — Z9049 Acquired absence of other specified parts of digestive tract: Secondary | ICD-10-CM | POA: Diagnosis not present

## 2020-11-25 DIAGNOSIS — Z4502 Encounter for adjustment and management of automatic implantable cardiac defibrillator: Secondary | ICD-10-CM | POA: Diagnosis not present

## 2020-11-25 DIAGNOSIS — Z853 Personal history of malignant neoplasm of breast: Secondary | ICD-10-CM | POA: Insufficient documentation

## 2020-11-25 DIAGNOSIS — I428 Other cardiomyopathies: Secondary | ICD-10-CM | POA: Diagnosis not present

## 2020-11-25 DIAGNOSIS — Z87891 Personal history of nicotine dependence: Secondary | ICD-10-CM | POA: Diagnosis not present

## 2020-11-25 DIAGNOSIS — I480 Paroxysmal atrial fibrillation: Secondary | ICD-10-CM | POA: Insufficient documentation

## 2020-11-25 HISTORY — PX: SUBQ ICD CHANGEOUT: EP1235

## 2020-11-25 SURGERY — SUBQ ICD CHANGEOUT
Anesthesia: General

## 2020-11-25 MED ORDER — TRAMADOL HCL 50 MG PO TABS: 50.0000 mg | ORAL_TABLET | Freq: Three times a day (TID) | ORAL | 0 refills | Status: AC | PRN

## 2020-11-25 MED ORDER — SODIUM CHLORIDE 0.9 % IV SOLN
INTRAVENOUS | Status: AC
  Filled 2020-11-25: qty 2

## 2020-11-25 MED ORDER — LIDOCAINE 2% (20 MG/ML) 5 ML SYRINGE
INTRAMUSCULAR | Status: DC | PRN
  Administered 2020-11-25: 50 mg via INTRAVENOUS

## 2020-11-25 MED ORDER — BUPIVACAINE HCL (PF) 0.25 % IJ SOLN
INTRAMUSCULAR | Status: AC
  Filled 2020-11-25: qty 60

## 2020-11-25 MED ORDER — ACETAMINOPHEN 325 MG PO TABS
325.0000 mg | ORAL_TABLET | ORAL | Status: DC | PRN
  Filled 2020-11-25: qty 2

## 2020-11-25 MED ORDER — ONDANSETRON HCL 4 MG/2ML IJ SOLN: 4.0000 mg | Freq: Four times a day (QID) | INTRAMUSCULAR | Status: DC | PRN

## 2020-11-25 MED ORDER — LACTATED RINGERS IV SOLN: INTRAVENOUS | Status: DC | PRN

## 2020-11-25 MED ORDER — SODIUM CHLORIDE 0.9 % IV SOLN
80.0000 mg | INTRAVENOUS | Status: AC
  Administered 2020-11-25: 80 mg
  Filled 2020-11-25 (×2): qty 2

## 2020-11-25 MED ORDER — MIDAZOLAM HCL 5 MG/5ML IJ SOLN
INTRAMUSCULAR | Status: DC | PRN
  Administered 2020-11-25: 2 mg via INTRAVENOUS

## 2020-11-25 MED ORDER — FENTANYL CITRATE (PF) 100 MCG/2ML IJ SOLN
INTRAMUSCULAR | Status: DC | PRN
  Administered 2020-11-25: 25 ug via INTRAVENOUS
  Administered 2020-11-25: 50 ug via INTRAVENOUS
  Administered 2020-11-25: 25 ug via INTRAVENOUS

## 2020-11-25 MED ORDER — CHLORHEXIDINE GLUCONATE 4 % EX LIQD: 4.0000 "application " | Freq: Once | CUTANEOUS | Status: DC

## 2020-11-25 MED ORDER — PHENYLEPHRINE HCL-NACL 10-0.9 MG/250ML-% IV SOLN
INTRAVENOUS | Status: DC | PRN
  Administered 2020-11-25: 15 ug/min via INTRAVENOUS

## 2020-11-25 MED ORDER — CEFAZOLIN SODIUM-DEXTROSE 2-4 GM/100ML-% IV SOLN
2.0000 g | INTRAVENOUS | Status: AC
  Administered 2020-11-25: 2 g via INTRAVENOUS

## 2020-11-25 MED ORDER — ONDANSETRON HCL 4 MG/2ML IJ SOLN
INTRAMUSCULAR | Status: DC | PRN
  Administered 2020-11-25: 4 mg via INTRAVENOUS

## 2020-11-25 MED ORDER — CEFAZOLIN SODIUM-DEXTROSE 2-4 GM/100ML-% IV SOLN
INTRAVENOUS | Status: AC
  Filled 2020-11-25: qty 100

## 2020-11-25 MED ORDER — SODIUM CHLORIDE 0.9 % IV SOLN: INTRAVENOUS | Status: DC

## 2020-11-25 MED ORDER — FENTANYL CITRATE (PF) 250 MCG/5ML IJ SOLN: INTRAMUSCULAR | Status: DC | PRN

## 2020-11-25 MED ORDER — DEXAMETHASONE SODIUM PHOSPHATE 10 MG/ML IJ SOLN
INTRAMUSCULAR | Status: DC | PRN
  Administered 2020-11-25: 5 mg via INTRAVENOUS

## 2020-11-25 MED ORDER — BUPIVACAINE HCL (PF) 0.25 % IJ SOLN
INTRAMUSCULAR | Status: DC | PRN
  Administered 2020-11-25: 45 mL

## 2020-11-25 MED ORDER — PROPOFOL 10 MG/ML IV BOLUS
INTRAVENOUS | Status: DC | PRN
  Administered 2020-11-25: 120 mg via INTRAVENOUS

## 2020-11-25 MED ORDER — POVIDONE-IODINE 10 % EX SWAB
2.0000 "application " | Freq: Once | CUTANEOUS | Status: AC
  Administered 2020-11-25: 2 via TOPICAL

## 2020-11-25 SURGICAL SUPPLY — 7 items
BLANKET WARM UNDERBOD FULL ACC (MISCELLANEOUS) ×2 IMPLANT
CABLE SURGICAL S-101-97-12 (CABLE) ×2 IMPLANT
ICD SUBCU MRI EMBLEM A219 (ICD Generator) ×2 IMPLANT
MAT PREVALON FULL STRYKER (MISCELLANEOUS) ×2 IMPLANT
PAD PRO RADIOLUCENT 2001M-C (PAD) ×2 IMPLANT
POUCH AIGIS-R ANTIBACT ICD (Mesh General) ×2 IMPLANT
TRAY PACEMAKER INSERTION (PACKS) ×2 IMPLANT

## 2020-11-25 NOTE — Transfer of Care (Signed)
Immediate Anesthesia Transfer of Care Note  Patient: [name]  Procedure(s) Performed: Naoma Diener ICD Tonita Cong (N/A )  Patient Location: PACU  Anesthesia Type:General  Level of Consciousness: awake, alert , oriented and patient cooperative  Airway & Oxygen Therapy: Patient Spontanous Breathing and Patient connected to nasal cannula oxygen  Post-op Assessment: Report given to RN and Post -op Vital signs reviewed and stable  Post vital signs: Reviewed and stable  Last Vitals:  Vitals Value Taken Time  BP 143/76 11/25/20 0928  Temp    Pulse 79 11/25/20 0930  Resp 15 11/25/20 0930  SpO2 99 % 11/25/20 0930  Vitals shown include unvalidated device data.  Last Pain:  Vitals:   11/25/20 0550  TempSrc:   PainSc: 0-No pain         Complications: No complications documented.

## 2020-11-25 NOTE — Anesthesia Procedure Notes (Signed)
Procedure Name: LMA Insertion Date/Time: 11/25/2020 8:01 AM Performed by: Cleda Daub, CRNA Pre-anesthesia Checklist: Patient identified, Emergency Drugs available, Suction available and Patient being monitored Patient Re-evaluated:Patient Re-evaluated prior to induction Oxygen Delivery Method: Circle system utilized Preoxygenation: Pre-oxygenation with 100% oxygen Induction Type: IV induction LMA: LMA inserted LMA Size: 5.0 Number of attempts: 1 Placement Confirmation: positive ETCO2 and breath sounds checked- equal and bilateral Tube secured with: Tape Dental Injury: Teeth and Oropharynx as per pre-operative assessment

## 2020-11-25 NOTE — Discharge Instructions (Signed)

## 2020-11-25 NOTE — Anesthesia Preprocedure Evaluation (Signed)
Anesthesia Evaluation  Patient identified by MRN, date of birth, ID band Patient awake    Reviewed: Allergy & Precautions, H&P , NPO status , Patient's Chart, lab work & pertinent test results  Airway Mallampati: II   Neck ROM: full    Dental   Pulmonary former smoker,    breath sounds clear to auscultation       Cardiovascular +CHF  + Cardiac Defibrillator  Rhythm:regular Rate:Normal  TTE (05/2019): EF 35-40%   Neuro/Psych    GI/Hepatic   Endo/Other    Renal/GU      Musculoskeletal   Abdominal   Peds  Hematology   Anesthesia Other Findings   Reproductive/Obstetrics                             Anesthesia Physical Anesthesia Plan  ASA: III  Anesthesia Plan: General   Post-op Pain Management:    Induction: Intravenous  PONV Risk Score and Plan: 3 and Ondansetron, Treatment may vary due to age or medical condition and Dexamethasone  Airway Management Planned: Oral ETT  Additional Equipment:   Intra-op Plan:   Post-operative Plan: Extubation in OR  Informed Consent: I have reviewed the patients History and Physical, chart, labs and discussed the procedure including the risks, benefits and alternatives for the proposed anesthesia with the patient or authorized representative who has indicated his/her understanding and acceptance.     Dental advisory given  Plan Discussed with: CRNA, Anesthesiologist and Surgeon  Anesthesia Plan Comments:         Anesthesia Quick Evaluation

## 2020-11-25 NOTE — H&P (Addendum)
ICD Criteria  Current LVEF:35%. Within 12 months prior to implant: Yes   Heart failure history: Yes, Class II  Cardiomyopathy history: Yes, Non-Ischemic Cardiomyopathy.  Atrial Fibrillation/Atrial Flutter: Yes, Paroxysmal.  Ventricular tachycardia history: Yes, Hemodynamic instability present. VT Type: Sustained Ventricular Tachycardia - Monomorphic.  Cardiac arrest history: No.  History of syndromes with risk of sudden death: No.  Previous ICD: Yes, Reason for ICD:  Primary prevention.  Current ICD indication: Secondary  PPM indication: No.  Class I or II Bradycardia indication present: No  Beta Blocker therapy for 3 or more months: Yes, prescribed.   Ace Inhibitor/ARB therapy for 3 or more months: Yes, prescribed.    I have seen Claudia [name] is a 65 y.o. femalepre-procedural and for consideration of ICD reimplant for secondary prevention of sudden death.  The patient's chart has been reviewed and they meet criteria for ICD implant.  I have had a thorough discussion with the patient reviewing options.  The patient and their family (if available) have had opportunities to ask questions and have them answered. The patient and I have decided together through the [place] [employer] to reimplant  ICD at this time.  Risks, benefits, alternatives to ICD implantation were discussed in detail with the patient today. The patient  understands that the risks include but are not limited to bleeding, infection, pneumothorax, perforation, tamponade, vascular damage, renal failure, MI, stroke, death, inappropriate shocks, and lead dislodgement and   wishes to proceed.         Patient Care Team: Iona Beard, MD as PCP - General (Family Medicine) Nicholas Lose, MD as Consulting Physician (Hematology and Oncology) Bensimhon, Shaune Pascal, MD as Consulting Physician (Cardiology)   HPI  Claudia [name] is a 65 y.o. female Admitted for replacement of SICD, initially  implanted for primary prevention with interval appropriate therapy 3/17 and inappropriate shocks for TWave oversensing  Hx of afib on Rivaroxaban   Followed by HF   DATE TEST EF   4/15 Echo   15-20 %   8/20 Echo   35-40 %   11/21 Echo  35-40%    Date Cr K Hgb  10/20 1.05 4.1 12.4  11/21  0.99 3.9 83.4    Thromboembolic risk factors ( age  -2, HTN-1, CHF-1, Gender-1) for a CHADSVASc Score of >=5    Records and Results Reviewed   Past Medical History:  Diagnosis Date  . AICD (automatic cardioverter/defibrillator) present 09/15/2014  . Breast cancer ([place])   . CHF (congestive heart failure) ([place])   . 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 CHOLECYSTECTOMY    . device removal     defib  . fiber tumor    . masectomy      Current Facility-Administered Medications  Medication Dose Route Frequency Provider Last Rate Last Admin  . 0.9 %  sodium chloride infusion   Intravenous Continuous Deboraha Sprang, MD 50 mL/hr at 11/25/20 1962 Restarted at 11/25/20 0659  . ceFAZolin (ANCEF) IVPB 2g/100 mL premix  2 g Intravenous On Call Deboraha Sprang, MD      . chlorhexidine (HIBICLENS) 4 % liquid 4 application  4 application Topical Once Deboraha Sprang, MD      . gentamicin (GARAMYCIN) 80 mg in sodium chloride 0.9 % 500 mL irrigation  80 mg Irrigation On Call Deboraha Sprang, MD        No  Known Allergies    Social History   Tobacco Use  . Smoking status: Former Research scientist (life sciences)  . Smokeless tobacco: Never Used  Vaping Use  . Vaping Use: Never used  Substance Use Topics  . Alcohol use: No  . Drug use: No     Family History  Problem Relation Age of Onset  . Diabetes Mother   . Cancer Father      Current Meds  Medication Sig  . acetaminophen (TYLENOL) 325 MG tablet Take 650 mg by mouth every 6 (six) hours as needed for moderate pain or mild pain.  . carvedilol (COREG) 25 MG tablet  Take 1 tablet by mouth twice a day with meals (Patient taking differently: Take 25 mg by mouth 2 (two) times daily with a meal.)  . cyclobenzaprine (FLEXERIL) 5 MG tablet Take 5 mg by mouth at bedtime.   Marland Kitchen eplerenone (INSPRA) 25 MG tablet Take 0.5 tablets (12.5 mg total) by mouth daily.  . furosemide (LASIX) 40 MG tablet Take 40 mg by mouth daily.  . hydrALAZINE (APRESOLINE) 25 MG tablet Take 1 & 1/2 tablets (37.5 mg) by mouth 3  times daily. (Patient taking differently: Take 25 mg by mouth 3 (three) times daily.)  . isosorbide mononitrate (IMDUR) 30 MG 24 hr tablet Take 1 tablet by mouth nightly (Patient taking differently: Take 30 mg by mouth daily.)  . rosuvastatin (CRESTOR) 5 MG tablet Take 5 mg by mouth at bedtime.  . sacubitril-valsartan (ENTRESTO) 97-103 MG Take 1 tablet by mouth 2 (two) times daily.  Alveda Reasons 20 MG TABS tablet Take 1 tablet by mouth once daily with supper (Patient taking differently: Take 20 mg by mouth daily with supper.)     Review of Systems negative except from HPI and PMH  Physical Exam BP 138/83   Pulse 81   Temp (!) 97.5 F (36.4 C) (Oral)   Resp 16   Ht 5\' 6"  (1.676 m)   Wt 97.5 kg   SpO2 100%   BMI 34.70 kg/m  Well developed and well nourished in no acute distress HENT normal E scleral and icterus clear Neck Supple JVP flat; carotids brisk and full Clear to ausculation  Regular rate and rhythm, no murmurs gallops or rub Soft with active bowel sounds No clubbing cyanosis  Edema Alert and oriented, grossly normal motor and sensory function Skin Warm and Dry  Assessment and  Plan Nonischemic cardiomyopathy  S ICD      Ventricular tachycardia- slow   Atrial fibrillation-paroxysmal  DVT  Sleep disordered breathing  CHF chronic Systolic  Euvolemic continue current meds  We have reviewed the benefits and risks of generator replacement.  These include but are not limited to lead fracture and infection.  The patient understands,  agrees and is willing to proceed.     Will use anesthesia for support

## 2020-11-28 ENCOUNTER — Encounter (HOSPITAL_COMMUNITY): Payer: Self-pay | Admitting: Internal Medicine

## 2020-11-29 NOTE — Anesthesia Postprocedure Evaluation (Signed)
Anesthesia Post Note  Patient: Claudia [name]  Procedure(s) Performed: [place] (N/A )     Patient location during evaluation: Cath Lab Anesthesia Type: General Level of consciousness: awake and alert Pain management: pain level controlled Vital Signs Assessment: post-procedure vital signs reviewed and stable Respiratory status: spontaneous breathing, nonlabored ventilation, respiratory function stable and patient connected to nasal cannula oxygen Cardiovascular status: blood pressure returned to baseline and stable Postop Assessment: no apparent nausea or vomiting Anesthetic complications: no   No complications documented.  Last Vitals:  Vitals:   11/25/20 1100 11/25/20 1200  BP: 137/79 137/76  Pulse: 75 79  Resp: 13 14  Temp:    SpO2: 100% 100%    Last Pain:  Vitals:   11/25/20 1200  TempSrc:   PainSc: 0-No pain                 HODIERNE,ADAM S

## 2020-12-06 ENCOUNTER — Ambulatory Visit (INDEPENDENT_AMBULATORY_CARE_PROVIDER_SITE_OTHER): Payer: HMO | Admitting: Emergency Medicine

## 2020-12-06 ENCOUNTER — Other Ambulatory Visit: Payer: Self-pay

## 2020-12-06 DIAGNOSIS — I428 Other cardiomyopathies: Secondary | ICD-10-CM

## 2020-12-06 LAB — CUP PACEART INCLINIC DEVICE CHECK
Date Time Interrogation Session: 20220308142327
Implantable Lead Implant Date: 20150819
Implantable Lead Location: 753862
Implantable Lead Model: 3010
Implantable Pulse Generator Implant Date: 20220225
Pulse Gen Serial Number: 153927

## 2020-12-06 NOTE — Progress Notes (Signed)
Wound check S-ICD.  Dermabond removed prior to appt.  Wound edges approximated, well healed.  No redenss, swelling or drainage.  Subcutaneous ICD check in clinic. 0 untreated episodes; 0 treated episodes; 0 shocks delivered. Electrode impedance status okay. No programming changes. Remaining longevity to ERI 100%.  ROV with Dr. Caryl Comes 02/24/21.  Patient not currently enrolled in remote monitoring.  [employer] industry rep confirms monitor is on backorder possibly as long as 2 months, education will be provided at delivery.

## 2020-12-27 DIAGNOSIS — Z1231 Encounter for screening mammogram for malignant neoplasm of breast: Secondary | ICD-10-CM | POA: Diagnosis not present

## 2021-01-03 ENCOUNTER — Other Ambulatory Visit (HOSPITAL_COMMUNITY): Payer: Self-pay | Admitting: Internal Medicine

## 2021-01-17 DIAGNOSIS — M7918 Myalgia, other site: Secondary | ICD-10-CM | POA: Diagnosis not present

## 2021-02-13 DIAGNOSIS — M7918 Myalgia, other site: Secondary | ICD-10-CM | POA: Diagnosis not present

## 2021-02-24 ENCOUNTER — Encounter: Payer: Self-pay | Admitting: Internal Medicine

## 2021-02-24 ENCOUNTER — Ambulatory Visit (INDEPENDENT_AMBULATORY_CARE_PROVIDER_SITE_OTHER): Payer: HMO | Admitting: Internal Medicine

## 2021-02-24 ENCOUNTER — Other Ambulatory Visit: Payer: Self-pay

## 2021-02-24 VITALS — BP 124/80 | HR 74 | Ht 66.0 in | Wt 214.6 lb

## 2021-02-24 DIAGNOSIS — I472 Ventricular tachycardia, unspecified: Secondary | ICD-10-CM

## 2021-02-24 DIAGNOSIS — Z95810 Presence of automatic (implantable) cardiac defibrillator: Secondary | ICD-10-CM

## 2021-02-24 DIAGNOSIS — I4729 Other ventricular tachycardia: Secondary | ICD-10-CM

## 2021-02-24 DIAGNOSIS — I5022 Chronic systolic (congestive) heart failure: Secondary | ICD-10-CM

## 2021-02-24 DIAGNOSIS — I428 Other cardiomyopathies: Secondary | ICD-10-CM

## 2021-02-24 DIAGNOSIS — I480 Paroxysmal atrial fibrillation: Secondary | ICD-10-CM | POA: Diagnosis not present

## 2021-02-24 NOTE — Patient Instructions (Signed)

## 2021-02-24 NOTE — Progress Notes (Signed)
Patient Care Team: Iona Beard, MD as PCP - General (Family Medicine) Nicholas Lose, MD as Consulting Physician (Hematology and Oncology) Bensimhon, Shaune Pascal, MD as Consulting Physician (Cardiology)   HPI  Claudia [name] is a 65 y.o. female seen in follow-up for SQ ICD implanted for primary prevention for nonischemic cardiomyopathy. She had previously implanted transvenous device removed because of infection.  Also remote history of atrial fibrillation for which she is anticoagulated with Rivaroxaban   Appropriate ICD therapy 3/17, had slow VT below detection with some under sensing, followed by over sensing and then shock.  Her device was reprogrammed to increase her detection rate to 250 bpm to decrease inappropriate shocks.  She subsequently had shocks for T wave oversensing requiring further reprogramming    The patient denies chest pain, shortness of breath, nocturnal dyspnea, orthopnea.  There have been no palpitations, lightheadedness or syncope.  Still with difficulty sleeping on her left side  Complains of some peripheral edema  She sleeps on pillows at night. Not able to sleep on her left side without developing soreness.   Reports swimming more frequently now.   Appointment with her Cardiologist on June 23rd.   DATE TEST EF   4/15 Echo   15-20 %   8/20 Echo   35-40 %   11/21 Echo  35-40%    Date Cr K Hgb  10/20 1.05 4.1 12.4  11/21  0.99 3.9 12.8  2/22 1.15 4.1 77.8    Thromboembolic risk factors ( age  -2, HTN-1, CHF-1, Gender-1) for a CHADSVASc Score of >=5    Records and Results Reviewed   Past Medical History:  Diagnosis Date  . AICD (automatic cardioverter/defibrillator) present 09/15/2014  . Breast cancer ([place])   . CHF (congestive heart failure) ([place])   . 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 CHOLECYSTECTOMY    . device removal     defib  . fiber  tumor    . masectomy    . SUBQ ICD CHANGEOUT N/A 11/25/2020   Procedure: [place];  Surgeon: Deboraha Sprang, MD;  Location: [facility];  Service: Cardiovascular;  Laterality: N/A;    Current Outpatient Medications  Medication Sig Dispense Refill  . acetaminophen (TYLENOL) 325 MG tablet Take 650 mg by mouth every 6 (six) hours as needed for moderate pain or mild pain.    . carvedilol (COREG) 25 MG tablet Take 1 tablet by mouth twice a day with meals 180 tablet 2  . cyclobenzaprine (FLEXERIL) 5 MG tablet Take 5 mg by mouth at bedtime.   2  . ENTRESTO 97-103 MG Take 1 tablet by mouth twice a day 180 tablet 3  . eplerenone (INSPRA) 25 MG tablet Take 1/2 tablet by mouth daily 45 tablet 3  . furosemide (LASIX) 40 MG tablet Take 40 mg by mouth daily.    . hydrALAZINE (APRESOLINE) 25 MG tablet Take 1 & 1/2 tablets (37.5 mg) by mouth 3  times daily. 405 tablet 2  . isosorbide mononitrate (IMDUR) 30 MG 24 hr tablet Take 1 tablet by mouth nightly 90 tablet 0  . rosuvastatin (CRESTOR) 5 MG tablet Take 5 mg by mouth at bedtime.    Alveda Reasons 20 MG TABS tablet Take 1 tablet by mouth once daily with supper 90 tablet 0   No current facility-administered medications for this visit.    No Known Allergies  Review of Systems negative except from HPI and PMH  Physical Exam BP 124/80   Pulse 74   Ht 5\' 6"  (1.676 m)   Wt 214 lb 9.6 oz (97.3 kg)   SpO2 99%   BMI 34.64 kg/m  Well developed and well nourished in no acute distress HENT normal Neck supple with JVP-flat Clear Device pocket well healed; without hematoma or erythema.  There is no tethering  Regular rate and rhythm, no gallop, No murmur Abd-soft with active BS No Clubbing cyanosis, no edema Skin-warm and dry A & Oriented  Grossly normal sensory and motor function  ECG sinus at 74 Interval 17/10/40 PAC*  Assessment and  Plan Nonischemic cardiomyopathy  S ICD      Ventricular tachycardia- slow   Atrial  fibrillation-paroxysmal  DVT  Sleep disordered breathing  CHF chronic Systolic   Device function is normal   Pt heart failure status is stable. Continue furosemide 40.  Electrolytes are stable.  Cardiomyopathy is stable.  We will continue her on her hydralazine/nitrates, Inspra carvedilol and Entresto  No interval atrial fibrillation of which she is aware.  Continue her Xarelto 20 mg daily  s    I,Alexis Bryant,acting as a scribe for Virl Axe, MD.,have documented all relevant documentation on the behalf of Virl Axe, MD,as directed by  Virl Axe, MD while in the presence of Virl Axe, MD.  I, Virl Axe, MD, have reviewed all documentation for this visit. The documentation on 02/24/21 for the exam, diagnosis, procedures, and orders are all accurate and complete.

## 2021-03-23 ENCOUNTER — Other Ambulatory Visit (HOSPITAL_COMMUNITY): Payer: Self-pay

## 2021-03-23 ENCOUNTER — Ambulatory Visit (HOSPITAL_COMMUNITY)
Admission: RE | Admit: 2021-03-23 | Discharge: 2021-03-23 | Disposition: A | Discharge status: Home / Self Care | Payer: HMO | Source: Ambulatory Visit | Attending: Internal Medicine | Admitting: Internal Medicine

## 2021-03-23 ENCOUNTER — Encounter (HOSPITAL_COMMUNITY): Payer: Self-pay | Admitting: Internal Medicine

## 2021-03-23 ENCOUNTER — Other Ambulatory Visit: Payer: Self-pay

## 2021-03-23 VITALS — BP 130/80 | HR 68 | Wt 213.4 lb

## 2021-03-23 DIAGNOSIS — Z79899 Other long term (current) drug therapy: Secondary | ICD-10-CM | POA: Diagnosis not present

## 2021-03-23 DIAGNOSIS — G4733 Obstructive sleep apnea (adult) (pediatric): Secondary | ICD-10-CM | POA: Diagnosis not present

## 2021-03-23 DIAGNOSIS — I5022 Chronic systolic (congestive) heart failure: Secondary | ICD-10-CM | POA: Diagnosis not present

## 2021-03-23 DIAGNOSIS — E785 Hyperlipidemia, unspecified: Secondary | ICD-10-CM | POA: Diagnosis not present

## 2021-03-23 DIAGNOSIS — I480 Paroxysmal atrial fibrillation: Secondary | ICD-10-CM | POA: Diagnosis not present

## 2021-03-23 DIAGNOSIS — Z87891 Personal history of nicotine dependence: Secondary | ICD-10-CM | POA: Insufficient documentation

## 2021-03-23 DIAGNOSIS — I110 Hypertensive heart disease with heart failure: Secondary | ICD-10-CM | POA: Insufficient documentation

## 2021-03-23 DIAGNOSIS — Z7901 Long term (current) use of anticoagulants: Secondary | ICD-10-CM | POA: Diagnosis not present

## 2021-03-23 DIAGNOSIS — Z9221 Personal history of antineoplastic chemotherapy: Secondary | ICD-10-CM | POA: Diagnosis not present

## 2021-03-23 DIAGNOSIS — Z95810 Presence of automatic (implantable) cardiac defibrillator: Secondary | ICD-10-CM | POA: Insufficient documentation

## 2021-03-23 DIAGNOSIS — Z853 Personal history of malignant neoplasm of breast: Secondary | ICD-10-CM | POA: Insufficient documentation

## 2021-03-23 DIAGNOSIS — Z86718 Personal history of other venous thrombosis and embolism: Secondary | ICD-10-CM | POA: Diagnosis not present

## 2021-03-23 LAB — BASIC METABOLIC PANEL
Anion gap: 8 (ref 5–15)
BUN: 6 mg/dL — ABNORMAL LOW (ref 8–23)
CO2: 24 mmol/L (ref 22–32)
Calcium: 9.7 mg/dL (ref 8.9–10.3)
Chloride: 106 mmol/L (ref 98–111)
Creatinine, Ser: 0.87 mg/dL (ref 0.44–1.00)
GFR, Estimated: >60 mL/min (ref >60)
Glucose, Bld: 99 mg/dL (ref 70–99)
Potassium: 4.3 mmol/L (ref 3.5–5.1)
Sodium: 138 mmol/L (ref 135–145)

## 2021-03-23 LAB — CBC
HCT: 41.9 % (ref 36.0–46.0)
Hemoglobin: 13.1 g/dL (ref 12.0–15.0)
MCH: 27.1 pg (ref 26.0–34.0)
MCHC: 31.3 g/dL (ref 30.0–36.0)
MCV: 86.6 fL (ref 80.0–100.0)
Platelets: 320 10*3/uL (ref 150–400)
RBC: 4.84 MIL/uL (ref 3.87–5.11)
RDW: 13.4 % (ref 11.5–15.5)
WBC: 3.6 10*3/uL — ABNORMAL LOW (ref 4.0–10.5)
nRBC: 0 % (ref 0.0–0.2)

## 2021-03-23 LAB — BRAIN NATRIURETIC PEPTIDE: B Natriuretic Peptide: 136.8 pg/mL — ABNORMAL HIGH (ref 0.0–100.0)

## 2021-03-23 MED ORDER — EMPAGLIFLOZIN 10 MG PO TABS: 10.0000 mg | ORAL_TABLET | Freq: Every day | ORAL | 5 refills | Status: DC

## 2021-03-23 MED ORDER — DAPAGLIFLOZIN PROPANEDIOL 10 MG PO TABS
10.0000 mg | ORAL_TABLET | Freq: Every day | ORAL | 5 refills | Status: DC
Start: 2021-03-23 — End: 2021-03-23

## 2021-03-23 NOTE — Progress Notes (Signed)
Patient ID: Claudia [name], female   DOB: [birth year], 65 y.o.   MRN: [id number]  Primary Cardiologist: Dr Claudia [name] PCP: Dr Claudia [name]  Oncology: Dr Claudia [name].  EP: Dr Claudia [name]  HPI: Claudia [name] is a 65 y/o woman with h/o breast cancer in 7017 and systolic HF due to chemotoxicity. She also h/o PAF and previous DVT for which she is on chronic anticoagulation. She moved from [place] in 07/2014 and saw Dr. Johnsie [name].   She developed breast cancer in 2008 and underwent chemotherapy and in the wake of this, she developed congestive heart failure with EF 20%. Cardiac cath with normal coronaries. She received an ICD It became infected in the spring of 2015 with staph  and she underwent extraction. She has undergone S ICD implantation following prolonged use of a LifeVest. She was ultimately transferred to [facility] in [place] in 01/2014 for consideration of heart transplantation. They diuresed her over 60 pounds and she improved markedly.   March 2017 after  ICD fired x3. Device was reprogrammed and bb was increased 18.75 mg twice a day. ECHO was repeated EF 45%. Dischaege weight 184 pounds.   Echo 8/20 EF 35-40% ECHO 11/2015: EF 45% Grade I DD ECHO 10/07/14 and EF 40-45% with normal RV.   Echo 11/21 EF 35-40%   Labs: 08/24/14:  Potassium 4.2 Creatinine 1.07 hgb 13.1 01/31/2015: K 4.0 Creatinine 1.28    Last visit 08/2020 overall feeling well. Going to classes at the gym 66x/week, line dancing, cardio. No CP, SOB, edema, or orthopnea/PND. Home bp average  Not wearing cpap  She has been doing well, had generator change with Dr. Caryl [name].  Still very active at the gym, doing water aerobics, going about every day.  Denies shortness of breath or chest pain.  BP at home is around 120-130/70-80.  Did not get sleep study done.      ROS: All systems negative except as listed in HPI, PMH and Problem List.  SH:  Social History   Socioeconomic History   Marital status: Divorced    Spouse name: Not on file   Number of children: Not on  file   Years of education: Not on file   Highest education level: Not on file  Occupational History   Not on file  Tobacco Use   Smoking status: Former    Pack years: 0.00   Smokeless tobacco: Never  Vaping Use   Vaping Use: Never used  Substance and Sexual Activity   Alcohol use: No   Drug use: No   Sexual activity: Not on file  Other Topics Concern   Not on file  Social History Narrative   Not on file   Social Determinants of Health   Financial Resource Strain: Not on file  Food Insecurity: No Food Insecurity   Worried About Running Out of Food in the Last Year: Never true   [place] in the Last Year: Never true  Transportation Needs: No Transportation Needs   Lack of Transportation (Medical): No   Lack of Transportation (Non-Medical): No  Physical Activity: Not on file  Stress: Not on file  Social Connections: Not on file  Intimate Partner Violence: Not on file    FH:  Family History  Problem Relation Age of Onset   Diabetes Mother    Cancer Father     Past Medical History:  Diagnosis Date   AICD (automatic cardioverter/defibrillator) present 09/15/2014   Breast cancer ([place])    CHF (congestive heart failure) ([place])  Implantable cardioverter-defibrillator infection  s/p extraction 10/07/2014    Current Outpatient Medications  Medication Sig Dispense Refill   acetaminophen (TYLENOL) 325 MG tablet Take 650 mg by mouth every 6 (six) hours as needed for moderate pain or mild pain.     carvedilol (COREG) 25 MG tablet Take 1 tablet by mouth twice a day with meals 180 tablet 2   ENTRESTO 97-103 MG Take 1 tablet by mouth twice a day 180 tablet 3   eplerenone (INSPRA) 25 MG tablet Take 1/2 tablet by mouth daily 45 tablet 3   furosemide (LASIX) 40 MG tablet Take 40 mg by mouth daily.     hydrALAZINE (APRESOLINE) 25 MG tablet Take 1 & 1/2 tablets (37.5 mg) by mouth 3  times daily. 405 tablet 2   isosorbide mononitrate (IMDUR) 30 MG 24 hr tablet Take 1 tablet by  mouth nightly 90 tablet 0   rosuvastatin (CRESTOR) 5 MG tablet Take 5 mg by mouth at bedtime.     XARELTO 20 MG TABS tablet Take 1 tablet by mouth once daily with supper 90 tablet 0   No current facility-administered medications for this encounter.    Vitals:   03/23/21 1016  BP: 130/80  Pulse: 68  SpO2: 97%  Weight: 96.8 kg (213 lb 6.4 oz)    PHYSICAL EXAM:  General:  Well appearing. No resp difficulty HEENT: normal Neck: supple. no JVD. Carotids 2+ bilat; no bruits. No lymphadenopathy or thryomegaly appreciated. Cor: PMI nondisplaced. Regular rate & rhythm. No rubs, gallops or murmurs. Lungs: clear, diminished in bilateral bases Abdomen: soft, nontender, nondistended. No hepatosplenomegaly. No bruits or masses. Good bowel sounds. Extremities: no cyanosis, clubbing, rash, edema Neuro: alert & orientedx3, cranial nerves grossly intact. moves all 4 extremities w/o difficulty. Affect pleasant   ASSESSMENT & PLAN: 1. Chronic systolic HF   - likely due to chemotoxicity from breast CA. EF in January 2016 40-45% .  - Cath normal in [place].  - s/p ICD  - Echo 8/20 EF 35-40%. - Continues to do well. NYHA I-II. Volume status ok on lasix 40 daily - Continue current carvedilol 25 mg bid. - Continue Imdur 30 mg  - Continue hydralazine 37.5 mg TID - Continue Entresto to 97/103 BID - Continue Inspra 25 mg daily - Add Jardiance 10 - BMET & CBC today - Repeat Echo  2. Breast CA  - s/p treatment 2008. No evidence of recurrence - per patient, no follow up needed  3. Paroxysmal AF  - In NSR today. On Xarelto no bleeding   4. H/o DVT - on Xarelto . No bleeding problems. Continue lifelong   5. Hyperlipidemia  - on crestor. Followed by Dr. Criss [name]  6. H/O VT ICD discharge x3 11/2015. - ICD interrogated personally in clinic. VT quiescent - s/p recent generator change with Dr. Caryl [name]  7. HTN - BP controlled on GDMT above - Continue home BP checks  8. OSA - wore CPAP in past but has  not worn in years - encouraged her to set up sleep study  Claudia Roan MD 10:37 AM  Patient seen and examined with the above-signed Advanced Practice Provider and/or Housestaff. I personally reviewed laboratory data, imaging studies and relevant notes. I independently examined the patient and formulated the important aspects of the plan. I have edited the note to reflect any of my changes or salient points. I have personally discussed the plan with the patient and/or family.  She is doing well. NYHA II. S/p recent  ICD generator change. Volume status well controlled on lasix 40 daily. No bleeding on Xarelto.   General:  Well appearing. No resp difficulty HEENT: normal Neck: supple. no JVD. Carotids 2+ bilat; no bruits. No lymphadenopathy or thryomegaly appreciated. Cor: PMI nondisplaced. Regular rate & rhythm. No rubs, gallops or murmurs. Lungs: clear Abdomen: soft, nontender, nondistended. No hepatosplenomegaly. No bruits or masses. Good bowel sounds. Extremities: no cyanosis, clubbing, rash, edema Neuro: alert & orientedx3, cranial nerves grossly intact. moves all 4 extremities w/o difficulty. Affect pleasant  Stable NYHA II. Add Jardiance 10. Make lasix prn. Can take extra lasix as needed. Labs today. Repeat echo at next visit.   Glori Bickers, MD  1:25 PM

## 2021-03-23 NOTE — Patient Instructions (Signed)
Start Jardiance 10 mg daily. We have supplied you with a card for first 14 days free. Kathlee [employer] IT consultant) will contact you about getting your Jardiance through your insurance company.  Change Lasix dose to only as needed.  Labs drawn today, will contact you if any abnormalities.  We will schedule echocardiogram for you in upcoming weeks. Return to Heart Failure Clinic in 4 months.

## 2021-03-30 DIAGNOSIS — I5042 Chronic combined systolic (congestive) and diastolic (congestive) heart failure: Secondary | ICD-10-CM | POA: Diagnosis not present

## 2021-03-30 DIAGNOSIS — I130 Hypertensive heart and chronic kidney disease with heart failure and stage 1 through stage 4 chronic kidney disease, or unspecified chronic kidney disease: Secondary | ICD-10-CM | POA: Diagnosis not present

## 2021-03-30 DIAGNOSIS — N182 Chronic kidney disease, stage 2 (mild): Secondary | ICD-10-CM | POA: Diagnosis not present

## 2021-03-30 DIAGNOSIS — I480 Paroxysmal atrial fibrillation: Secondary | ICD-10-CM | POA: Diagnosis not present

## 2021-04-04 DIAGNOSIS — Z8616 Personal history of COVID-19: Secondary | ICD-10-CM | POA: Diagnosis not present

## 2021-04-06 ENCOUNTER — Other Ambulatory Visit (HOSPITAL_COMMUNITY): Payer: Self-pay

## 2021-04-06 ENCOUNTER — Ambulatory Visit (HOSPITAL_COMMUNITY): Payer: HMO

## 2021-04-10 ENCOUNTER — Other Ambulatory Visit (HOSPITAL_COMMUNITY): Payer: Self-pay | Admitting: Internal Medicine

## 2021-04-21 ENCOUNTER — Other Ambulatory Visit (HOSPITAL_COMMUNITY): Payer: Self-pay | Admitting: Internal Medicine

## 2021-04-26 ENCOUNTER — Other Ambulatory Visit (HOSPITAL_COMMUNITY): Payer: Self-pay | Admitting: *Deleted

## 2021-04-26 MED ORDER — HYDRALAZINE HCL 25 MG PO TABS: ORAL_TABLET | ORAL | 3 refills | Status: DC

## 2021-05-08 DIAGNOSIS — I5042 Chronic combined systolic (congestive) and diastolic (congestive) heart failure: Secondary | ICD-10-CM | POA: Diagnosis not present

## 2021-05-08 DIAGNOSIS — Z7901 Long term (current) use of anticoagulants: Secondary | ICD-10-CM | POA: Diagnosis not present

## 2021-05-08 DIAGNOSIS — I427 Cardiomyopathy due to drug and external agent: Secondary | ICD-10-CM | POA: Diagnosis not present

## 2021-05-08 DIAGNOSIS — I480 Paroxysmal atrial fibrillation: Secondary | ICD-10-CM | POA: Diagnosis not present

## 2021-05-08 DIAGNOSIS — N183 Chronic kidney disease, stage 3 unspecified: Secondary | ICD-10-CM | POA: Diagnosis not present

## 2021-05-08 DIAGNOSIS — Z95810 Presence of automatic (implantable) cardiac defibrillator: Secondary | ICD-10-CM | POA: Diagnosis not present

## 2021-05-23 DIAGNOSIS — H5213 Myopia, bilateral: Secondary | ICD-10-CM | POA: Diagnosis not present

## 2021-05-23 DIAGNOSIS — H52203 Unspecified astigmatism, bilateral: Secondary | ICD-10-CM | POA: Diagnosis not present

## 2021-05-23 DIAGNOSIS — H25013 Cortical age-related cataract, bilateral: Secondary | ICD-10-CM | POA: Diagnosis not present

## 2021-05-23 DIAGNOSIS — H2513 Age-related nuclear cataract, bilateral: Secondary | ICD-10-CM | POA: Diagnosis not present

## 2021-05-23 DIAGNOSIS — H524 Presbyopia: Secondary | ICD-10-CM | POA: Diagnosis not present

## 2021-05-29 ENCOUNTER — Ambulatory Visit (INDEPENDENT_AMBULATORY_CARE_PROVIDER_SITE_OTHER): Payer: HMO

## 2021-05-29 DIAGNOSIS — I428 Other cardiomyopathies: Secondary | ICD-10-CM | POA: Diagnosis not present

## 2021-05-29 LAB — CUP PACEART REMOTE DEVICE CHECK
Battery Remaining Percentage: 96 %
Date Time Interrogation Session: 20220829070700
Implantable Lead Implant Date: 20150819
Implantable Lead Location: 753862
Implantable Lead Model: 3010
Implantable Pulse Generator Implant Date: 20220225
Pulse Gen Serial Number: 153927

## 2021-06-01 ENCOUNTER — Other Ambulatory Visit (HOSPITAL_COMMUNITY): Payer: Self-pay

## 2021-06-01 MED ORDER — EMPAGLIFLOZIN 10 MG PO TABS: 10.0000 mg | ORAL_TABLET | Freq: Every day | ORAL | 0 refills | Status: DC

## 2021-06-02 ENCOUNTER — Other Ambulatory Visit (HOSPITAL_COMMUNITY): Payer: Self-pay | Admitting: Internal Medicine

## 2021-06-09 NOTE — Progress Notes (Signed)
Remote ICD transmission.   

## 2021-06-19 DIAGNOSIS — H9201 Otalgia, right ear: Secondary | ICD-10-CM | POA: Diagnosis not present

## 2021-06-30 DIAGNOSIS — I480 Paroxysmal atrial fibrillation: Secondary | ICD-10-CM | POA: Diagnosis not present

## 2021-06-30 DIAGNOSIS — I130 Hypertensive heart and chronic kidney disease with heart failure and stage 1 through stage 4 chronic kidney disease, or unspecified chronic kidney disease: Secondary | ICD-10-CM | POA: Diagnosis not present

## 2021-06-30 DIAGNOSIS — I5042 Chronic combined systolic (congestive) and diastolic (congestive) heart failure: Secondary | ICD-10-CM | POA: Diagnosis not present

## 2021-06-30 DIAGNOSIS — N182 Chronic kidney disease, stage 2 (mild): Secondary | ICD-10-CM | POA: Diagnosis not present

## 2021-08-08 ENCOUNTER — Other Ambulatory Visit (HOSPITAL_COMMUNITY): Payer: Self-pay

## 2021-08-08 DIAGNOSIS — N183 Chronic kidney disease, stage 3 unspecified: Secondary | ICD-10-CM | POA: Diagnosis not present

## 2021-08-08 DIAGNOSIS — Z7901 Long term (current) use of anticoagulants: Secondary | ICD-10-CM | POA: Diagnosis not present

## 2021-08-08 DIAGNOSIS — I427 Cardiomyopathy due to drug and external agent: Secondary | ICD-10-CM | POA: Diagnosis not present

## 2021-08-08 DIAGNOSIS — I5042 Chronic combined systolic (congestive) and diastolic (congestive) heart failure: Secondary | ICD-10-CM | POA: Diagnosis not present

## 2021-08-08 DIAGNOSIS — M1830 Unilateral post-traumatic osteoarthritis of first carpometacarpal joint, unspecified hand: Secondary | ICD-10-CM | POA: Diagnosis not present

## 2021-08-08 DIAGNOSIS — I480 Paroxysmal atrial fibrillation: Secondary | ICD-10-CM | POA: Diagnosis not present

## 2021-08-08 DIAGNOSIS — Z0001 Encounter for general adult medical examination with abnormal findings: Secondary | ICD-10-CM | POA: Diagnosis not present

## 2021-08-08 DIAGNOSIS — E785 Hyperlipidemia, unspecified: Secondary | ICD-10-CM | POA: Diagnosis not present

## 2021-08-08 MED ORDER — EMPAGLIFLOZIN 10 MG PO TABS: 10.0000 mg | ORAL_TABLET | Freq: Every day | ORAL | 0 refills | Status: DC

## 2021-08-09 ENCOUNTER — Other Ambulatory Visit: Payer: Self-pay

## 2021-08-09 ENCOUNTER — Encounter (HOSPITAL_COMMUNITY): Payer: Self-pay | Admitting: Internal Medicine

## 2021-08-09 ENCOUNTER — Ambulatory Visit (HOSPITAL_COMMUNITY)
Admission: RE | Admit: 2021-08-09 | Discharge: 2021-08-09 | Disposition: A | Discharge status: Home / Self Care | Payer: HMO | Source: Ambulatory Visit | Attending: Internal Medicine | Admitting: Internal Medicine

## 2021-08-09 VITALS — BP 132/84 | HR 72 | Wt 203.6 lb

## 2021-08-09 DIAGNOSIS — Z7901 Long term (current) use of anticoagulants: Secondary | ICD-10-CM | POA: Insufficient documentation

## 2021-08-09 DIAGNOSIS — G4733 Obstructive sleep apnea (adult) (pediatric): Secondary | ICD-10-CM | POA: Insufficient documentation

## 2021-08-09 DIAGNOSIS — E785 Hyperlipidemia, unspecified: Secondary | ICD-10-CM | POA: Insufficient documentation

## 2021-08-09 DIAGNOSIS — I480 Paroxysmal atrial fibrillation: Secondary | ICD-10-CM

## 2021-08-09 DIAGNOSIS — Z87891 Personal history of nicotine dependence: Secondary | ICD-10-CM | POA: Diagnosis not present

## 2021-08-09 DIAGNOSIS — Z853 Personal history of malignant neoplasm of breast: Secondary | ICD-10-CM | POA: Insufficient documentation

## 2021-08-09 DIAGNOSIS — I110 Hypertensive heart disease with heart failure: Secondary | ICD-10-CM | POA: Insufficient documentation

## 2021-08-09 DIAGNOSIS — I472 Ventricular tachycardia, unspecified: Secondary | ICD-10-CM

## 2021-08-09 DIAGNOSIS — I5022 Chronic systolic (congestive) heart failure: Secondary | ICD-10-CM | POA: Diagnosis not present

## 2021-08-09 DIAGNOSIS — Z86718 Personal history of other venous thrombosis and embolism: Secondary | ICD-10-CM | POA: Insufficient documentation

## 2021-08-09 DIAGNOSIS — I10 Essential (primary) hypertension: Secondary | ICD-10-CM

## 2021-08-09 NOTE — Progress Notes (Signed)
Patient ID: Claudia [name], female   DOB: 22-Oct-1955, 65 y.o.   MRN: [id number]  Primary Cardiologist: Dr Haroldine Laws PCP: Dr Criss Rosales  Oncology: Dr Lindi Adie.  EP: Dr Lynelle Doctor  HPI: Ms. Claudia [name] is a 65 y/o woman with h/o breast cancer in 1443 and systolic HF due to chemotoxicity. She also h/o PAF and previous DVT for which she is on chronic anticoagulation. She moved from [place] in 07/2014 and saw Dr. Johnsie Cancel.   She developed breast cancer in 2008 and underwent chemotherapy and in the wake of this, she developed congestive heart failure with EF 20%. Cardiac cath with normal coronaries. She received an ICD. It became infected in the spring of 2015 with staph and it was extracted. She has undergone S ICD implantation following prolonged use of a LifeVest. She was ultimately transferred to [facility] in [place] in 01/2014 for consideration of heart transplantation. They diuresed her over 60 pounds and she improved markedly.   March 2017 after  ICD fired x3. Device was reprogrammed and beta blocker increased. ECHO repeated with EF 45%.   Echo 8/20 EF 35-40% ECHO 11/2015: EF 45% Grade I DD ECHO 10/07/14 and EF 40-45% with normal RV.   Echo 11/21 EF 35-40%   Last seen for f/u 06/22. Volume status was stable. Started on Jardiance. Lasix switched to prn. Scheduled for echo after last visit but cancelled after tested positive for COVID-19.  She is here today for f/u. Feels ok. Remains active. Goes to the pool 3x/week. No CP, sob, orthopnea or PND. No edema. No ICD shock    ROS: All systems negative except as listed in HPI, PMH and Problem List.  SH:  Social History   Socioeconomic History   Marital status: Divorced    Spouse name: Not on file   Number of children: Not on file   Years of education: Not on file   Highest education level: Not on file  Occupational History   Not on file  Tobacco Use   Smoking status: Former   Smokeless tobacco: Never  Vaping Use   Vaping Use: Never used  Substance and Sexual  Activity   Alcohol use: No   Drug use: No   Sexual activity: Not on file  Other Topics Concern   Not on file  Social History Narrative   Not on file   Social Determinants of Health   Financial Resource Strain: Not on file  Food Insecurity: Not on file  Transportation Needs: Not on file  Physical Activity: Not on file  Stress: Not on file  Social Connections: Not on file  Intimate Partner Violence: Not on file    FH:  Family History  Problem Relation Age of Onset   Diabetes Mother    Cancer Father     Past Medical History:  Diagnosis Date   AICD (automatic cardioverter/defibrillator) present 09/15/2014   Breast cancer ([place])    CHF (congestive heart failure) ([place])    Implantable cardioverter-defibrillator infection  s/p extraction 10/07/2014    Current Outpatient Medications  Medication Sig Dispense Refill   acetaminophen (TYLENOL) 325 MG tablet Take 650 mg by mouth every 6 (six) hours as needed for moderate pain or mild pain.     carvedilol (COREG) 25 MG tablet Take 1 tablet by mouth twice a day with meals 180 tablet 1   empagliflozin (JARDIANCE) 10 MG TABS tablet Take 1 tablet (10 mg total) by mouth daily before breakfast. 90 tablet 0   ENTRESTO 97-103 MG Take 1 tablet  by mouth twice a day 180 tablet 3   eplerenone (INSPRA) 25 MG tablet Take 1/2 tablet by mouth daily 45 tablet 3   furosemide (LASIX) 40 MG tablet Take 40 mg by mouth as needed.     hydrALAZINE (APRESOLINE) 25 MG tablet Take 1 & 1/2 tablets (37.5 mg) by mouth 3 times daily 405 tablet 3   isosorbide mononitrate (IMDUR) 30 MG 24 hr tablet Take 1 tablet by mouth nightly 90 tablet 0   rosuvastatin (CRESTOR) 5 MG tablet Take 5 mg by mouth at bedtime.     XARELTO 20 MG TABS tablet Take 1 tablet by mouth once daily with supper 90 tablet 0   No current facility-administered medications for this encounter.    Vitals:   08/09/21 1346  BP: 132/84  Pulse: 72  SpO2: 97%  Weight: 92.4 kg (203 lb 9.6 oz)      PHYSICAL EXAM:  General:  Well appearing. No resp difficulty HEENT: normal Neck: supple. no JVD. Carotids 2+ bilat; no bruits. No lymphadenopathy or thryomegaly appreciated. Cor: PMI nondisplaced. Regular rate & rhythm. No rubs, gallops or murmurs. Lungs: clear Abdomen: soft, nontender, nondistended. No hepatosplenomegaly. No bruits or masses. Good bowel sounds. Extremities: no cyanosis, clubbing, rash, edema Neuro: alert & orientedx3, cranial nerves grossly intact. moves all 4 extremities w/o difficulty. Affect pleasant   ASSESSMENT & PLAN: 1. Chronic systolic HF   - likely due to chemotoxicity from breast CA. EF in January 2016 40-45% .  - Cath normal in [place].  - s/p [place] Sci SQ ICD  - Echo 8/20 EF 35-40%. - Echo 11/21 EF 35-4% RV moderately reduced - NYHA I Volume looks good  - Continue carvedilol 25 mg bid. - Continue Imdur 30 mg  - Continue hydralazine 37.5 mg TID - Continue Entresto to 97/103 BID - Continue Inspra 25 mg daily - Continue Jardiance 10 mg - Doing very well on excellent GDMT. Due for repeat echo  -  Blood pressure well controlled. (BP was 106/60 at PCP office yesterday so will not titrate hydralazine further)   - Had bloodwork yesterday with Dr. Berdine Addison  2. Breast CA  - s/p treatment 2008. No evidence of recurrence - per patient, no follow up needed  3. Paroxysmal AF  - Remains in NSR  On Xarelto no bleeding   4. H/o DVT - on Xarelto . No bleeding problems. Continue lifelong   5. Hyperlipidemia  - on crestor. Followed by Dr. Berdine Addison  6. H/O VT ICD discharge x3 11/2015. - ICD interrogated personally in clinic. VT quiescent - Followed by Dr. Berdine Addison   7. HTN - Blood pressure well controlled. (BP was 106/60 at PCP office yesterday so will not titrate hydralazine further)  Continue current regimen.   8. OSA - wore CPAP in past but has not worn in years - encouraged her to set up sleep study  Glori Bickers, PA 1:54 PM

## 2021-08-09 NOTE — Addendum Note (Signed)
Encounter addended by: Scarlette Calico, RN on: 08/09/2021 2:37 PM  Actions taken: Order Reconciliation Section accessed, Charge Capture section accepted

## 2021-08-09 NOTE — Patient Instructions (Addendum)
No Labs done today.  No medication changes were made. Please continue all current medications as prescribed.  Your physician recommends that you schedule a follow-up appointment soon for an echo and in 1 year for an appointment with Dr. Haroldine Laws. Please contact our office in October 2023 to schedule a November 2023 appointment.   If you have any questions or concerns before your next appointment please send [place] a message through [place] or call our office at [phone].    TO LEAVE A MESSAGE FOR THE NURSE SELECT OPTION 2, PLEASE LEAVE A MESSAGE INCLUDING: YOUR NAME DATE OF BIRTH CALL BACK NUMBER REASON FOR CALL**this is important as we prioritize the call backs  YOU WILL RECEIVE A CALL BACK THE SAME DAY AS LONG AS YOU CALL BEFORE 4:00 PM   Do the following things EVERYDAY: Weigh yourself in the morning before breakfast. Write it down and keep it in a log. Take your medicines as prescribed Eat low salt foods--Limit salt (sodium) to 2000 mg per day.  Stay as active as you can everyday Limit all fluids for the day to less than 2 liters   At the [facility], you and your health needs are our priority. As part of our continuing mission to provide you with exceptional heart care, we have created designated Provider Care Teams. These Care Teams include your primary Cardiologist (physician) and Advanced Practice Providers (APPs- Physician Assistants and Nurse Practitioners) who all work together to provide you with the care you need, when you need it.   You may see any of the following providers on your designated Care Team at your next follow up: Dr Glori Bickers Dr Haynes Kerns, NP Lyda Jester, [place] Audry Riles, PharmD   Please be sure to bring in all your medications bottles to every appointment.

## 2021-08-28 ENCOUNTER — Ambulatory Visit (INDEPENDENT_AMBULATORY_CARE_PROVIDER_SITE_OTHER): Payer: HMO

## 2021-08-28 DIAGNOSIS — I428 Other cardiomyopathies: Secondary | ICD-10-CM

## 2021-08-28 LAB — CUP PACEART REMOTE DEVICE CHECK
Battery Remaining Percentage: 93 %
Date Time Interrogation Session: 20221128123900
Implantable Lead Implant Date: 20150819
Implantable Lead Location: 753862
Implantable Lead Model: 3010
Implantable Pulse Generator Implant Date: 20220225
Pulse Gen Serial Number: 153927

## 2021-09-04 ENCOUNTER — Other Ambulatory Visit (HOSPITAL_COMMUNITY): Payer: Self-pay

## 2021-09-04 MED ORDER — CARVEDILOL 25 MG PO TABS: 25.0000 mg | ORAL_TABLET | Freq: Two times a day (BID) | ORAL | 1 refills | Status: DC

## 2021-09-05 NOTE — Progress Notes (Signed)
Remote ICD transmission.   

## 2021-09-11 ENCOUNTER — Other Ambulatory Visit (HOSPITAL_COMMUNITY): Payer: Self-pay | Admitting: Internal Medicine

## 2021-09-26 ENCOUNTER — Ambulatory Visit (HOSPITAL_COMMUNITY)
Admission: RE | Admit: 2021-09-26 | Discharge: 2021-09-26 | Disposition: A | Discharge status: Home / Self Care | Payer: HMO | Source: Ambulatory Visit | Attending: Internal Medicine | Admitting: Internal Medicine

## 2021-09-26 ENCOUNTER — Other Ambulatory Visit: Payer: Self-pay

## 2021-09-26 DIAGNOSIS — I080 Rheumatic disorders of both mitral and aortic valves: Secondary | ICD-10-CM | POA: Insufficient documentation

## 2021-09-26 DIAGNOSIS — I4891 Unspecified atrial fibrillation: Secondary | ICD-10-CM | POA: Diagnosis not present

## 2021-09-26 DIAGNOSIS — I429 Cardiomyopathy, unspecified: Secondary | ICD-10-CM | POA: Diagnosis not present

## 2021-09-26 DIAGNOSIS — I5022 Chronic systolic (congestive) heart failure: Secondary | ICD-10-CM | POA: Diagnosis not present

## 2021-09-26 LAB — ECHOCARDIOGRAM COMPLETE
Area-P 1/2: 4.31 cm2
Calc EF: 33.9 %
MV M vel: 5.75 m/s
MV Peak grad: 132.3 mmHg
Radius: 0.4 cm
S' Lateral: 5 cm
Single Plane A2C EF: 33.7 %
Single Plane A4C EF: 40.3 %

## 2021-09-26 NOTE — Progress Notes (Signed)
°  Echocardiogram 2D Echocardiogram has been performed.  Claudia [name] 09/26/2021, 1:51 PM

## 2021-10-04 ENCOUNTER — Other Ambulatory Visit (HOSPITAL_COMMUNITY): Payer: Self-pay

## 2021-10-04 MED ORDER — ENTRESTO 97-103 MG PO TABS: 1.0000 | ORAL_TABLET | Freq: Two times a day (BID) | ORAL | 3 refills | Status: DC

## 2021-10-04 MED ORDER — EPLERENONE 25 MG PO TABS: 12.5000 mg | ORAL_TABLET | Freq: Every day | ORAL | 3 refills | Status: DC

## 2021-10-11 ENCOUNTER — Other Ambulatory Visit (HOSPITAL_COMMUNITY): Payer: Self-pay

## 2021-10-11 MED ORDER — EMPAGLIFLOZIN 10 MG PO TABS: 10.0000 mg | ORAL_TABLET | Freq: Every day | ORAL | 3 refills | Status: DC

## 2021-10-23 ENCOUNTER — Encounter: Payer: Self-pay | Admitting: Internal Medicine

## 2021-11-14 DIAGNOSIS — C50912 Malignant neoplasm of unspecified site of left female breast: Secondary | ICD-10-CM | POA: Diagnosis not present

## 2021-11-21 DIAGNOSIS — C50912 Malignant neoplasm of unspecified site of left female breast: Secondary | ICD-10-CM | POA: Diagnosis not present

## 2021-11-27 ENCOUNTER — Ambulatory Visit (INDEPENDENT_AMBULATORY_CARE_PROVIDER_SITE_OTHER): Payer: HMO

## 2021-11-27 DIAGNOSIS — I428 Other cardiomyopathies: Secondary | ICD-10-CM | POA: Diagnosis not present

## 2021-11-27 LAB — CUP PACEART REMOTE DEVICE CHECK
Battery Remaining Percentage: 90 %
Date Time Interrogation Session: 20230227071600
Implantable Lead Implant Date: 20150819
Implantable Lead Location: 753862
Implantable Lead Model: 3010
Implantable Pulse Generator Implant Date: 20220225
Pulse Gen Serial Number: 153927

## 2021-11-28 DIAGNOSIS — N182 Chronic kidney disease, stage 2 (mild): Secondary | ICD-10-CM | POA: Diagnosis not present

## 2021-11-28 DIAGNOSIS — I5042 Chronic combined systolic (congestive) and diastolic (congestive) heart failure: Secondary | ICD-10-CM | POA: Diagnosis not present

## 2021-11-28 DIAGNOSIS — I130 Hypertensive heart and chronic kidney disease with heart failure and stage 1 through stage 4 chronic kidney disease, or unspecified chronic kidney disease: Secondary | ICD-10-CM | POA: Diagnosis not present

## 2021-11-28 DIAGNOSIS — I480 Paroxysmal atrial fibrillation: Secondary | ICD-10-CM | POA: Diagnosis not present

## 2021-11-30 NOTE — Progress Notes (Signed)
Remote ICD transmission.   

## 2021-12-18 DIAGNOSIS — Z6833 Body mass index (BMI) 33.0-33.9, adult: Secondary | ICD-10-CM | POA: Diagnosis not present

## 2021-12-18 DIAGNOSIS — I5042 Chronic combined systolic (congestive) and diastolic (congestive) heart failure: Secondary | ICD-10-CM | POA: Diagnosis not present

## 2021-12-18 DIAGNOSIS — Z7901 Long term (current) use of anticoagulants: Secondary | ICD-10-CM | POA: Diagnosis not present

## 2021-12-18 DIAGNOSIS — I480 Paroxysmal atrial fibrillation: Secondary | ICD-10-CM | POA: Diagnosis not present

## 2021-12-18 DIAGNOSIS — N183 Chronic kidney disease, stage 3 unspecified: Secondary | ICD-10-CM | POA: Diagnosis not present

## 2021-12-18 DIAGNOSIS — I427 Cardiomyopathy due to drug and external agent: Secondary | ICD-10-CM | POA: Diagnosis not present

## 2021-12-18 DIAGNOSIS — E785 Hyperlipidemia, unspecified: Secondary | ICD-10-CM | POA: Diagnosis not present

## 2021-12-29 DIAGNOSIS — J449 Chronic obstructive pulmonary disease, unspecified: Secondary | ICD-10-CM | POA: Diagnosis not present

## 2021-12-29 DIAGNOSIS — E785 Hyperlipidemia, unspecified: Secondary | ICD-10-CM | POA: Diagnosis not present

## 2021-12-29 DIAGNOSIS — Z720 Tobacco use: Secondary | ICD-10-CM | POA: Diagnosis not present

## 2022-01-02 DIAGNOSIS — Z1231 Encounter for screening mammogram for malignant neoplasm of breast: Secondary | ICD-10-CM | POA: Diagnosis not present

## 2022-01-02 DIAGNOSIS — Z853 Personal history of malignant neoplasm of breast: Secondary | ICD-10-CM | POA: Diagnosis not present

## 2022-01-15 ENCOUNTER — Encounter: Payer: Self-pay | Admitting: Podiatry

## 2022-01-15 ENCOUNTER — Ambulatory Visit: Payer: HMO | Admitting: Podiatry

## 2022-01-15 DIAGNOSIS — M722 Plantar fascial fibromatosis: Secondary | ICD-10-CM | POA: Diagnosis not present

## 2022-01-15 MED ORDER — TRIAMCINOLONE ACETONIDE 10 MG/ML IJ SUSP
10.0000 mg | Freq: Once | INTRAMUSCULAR | Status: AC
  Administered 2022-01-15: 10 mg

## 2022-01-15 NOTE — Progress Notes (Signed)
Subjective:  ? ?Patient ID: Claudia [name], female   DOB: 66 y.o.   MRN: [id number]  ? ?HPI ?Patient states she has developed a lot of pain in the bottom of her left heel and its gradually making it harder for her to walk ? ? ?ROS ? ? ?   ?Objective:  ?Physical Exam  ?Neurovascular status intact with pain in the plantar heel left at the insertional point tendon calcaneus ? ?   ?Assessment:  ?Acute plantar fasciitis left heel ? ?   ?Plan:  ?Sterile prep injected the plantar fascia at the insertion 3 mg Kenalog 5 mg Xylocaine advised on support shoes ?   ? ? ?

## 2022-02-05 ENCOUNTER — Other Ambulatory Visit (HOSPITAL_COMMUNITY): Payer: Self-pay

## 2022-02-05 MED ORDER — CARVEDILOL 25 MG PO TABS: 25.0000 mg | ORAL_TABLET | Freq: Two times a day (BID) | ORAL | 2 refills | Status: DC

## 2022-02-27 ENCOUNTER — Ambulatory Visit (INDEPENDENT_AMBULATORY_CARE_PROVIDER_SITE_OTHER): Payer: HMO

## 2022-02-27 DIAGNOSIS — I428 Other cardiomyopathies: Secondary | ICD-10-CM

## 2022-02-27 LAB — CUP PACEART REMOTE DEVICE CHECK
Battery Remaining Percentage: 88 %
Date Time Interrogation Session: 20230530070100
Implantable Lead Implant Date: 20150819
Implantable Lead Location: 753862
Implantable Lead Model: 3010
Implantable Pulse Generator Implant Date: 20220225
Pulse Gen Serial Number: 153927

## 2022-02-28 ENCOUNTER — Encounter: Payer: Self-pay | Admitting: Internal Medicine

## 2022-02-28 ENCOUNTER — Ambulatory Visit (INDEPENDENT_AMBULATORY_CARE_PROVIDER_SITE_OTHER): Payer: HMO | Admitting: Internal Medicine

## 2022-02-28 VITALS — BP 120/84 | HR 64 | Ht 66.0 in | Wt 207.0 lb

## 2022-02-28 DIAGNOSIS — I480 Paroxysmal atrial fibrillation: Secondary | ICD-10-CM | POA: Diagnosis not present

## 2022-02-28 DIAGNOSIS — I4729 Other ventricular tachycardia: Secondary | ICD-10-CM | POA: Diagnosis not present

## 2022-02-28 DIAGNOSIS — I5022 Chronic systolic (congestive) heart failure: Secondary | ICD-10-CM

## 2022-02-28 DIAGNOSIS — Z95810 Presence of automatic (implantable) cardiac defibrillator: Secondary | ICD-10-CM

## 2022-02-28 DIAGNOSIS — I428 Other cardiomyopathies: Secondary | ICD-10-CM

## 2022-02-28 NOTE — Progress Notes (Unsigned)
Patient Care Team: Iona Beard, MD as PCP - General (Family Medicine) Nicholas Lose, MD as Consulting Physician (Hematology and Oncology) Bensimhon, Shaune Pascal, MD as Consulting Physician (Cardiology)   HPI  Claudia [name] is a 66 y.o. female seen in follow-up for SQ ICD implanted 2015 for primary prevention for nonischemic cardiomyopathy attributed to chemotherapy for breast cancer.. Gen change 2022  She had previously implanted transvenous device removed because of infection.  Also remote history of atrial fibrillation for which she is anticoagulated with Rivaroxaban   Appropriate ICD therapy 3/17, had slow VT below detection with some under sensing, followed by over sensing and then shock.  Her device was reprogrammed to increase her detection rate to 250 bpm to decrease inappropriate shocks.  She subsequently had shocks for T wave oversensing requiring further reprogramming    The patient denies chest pain, shortness of breath, nocturnal dyspne, orthopnea or peripheral edema.  There have been no palpitations, lightheadedness or syncope.  Has been traveling.  Went with her daughter to [place] in to see her son and his children in [place].Marland Kitchen    DATE TEST EF   4/15 Echo   15-20 %   8/20 Echo   35-40 %   11/21 Echo  35-40%   12/22 Echo   35-40% MR Mild-mod   Date Cr K Hgb  10/20 1.05 4.1 12.4  11/21  0.99 3.9 12.8  2/22 1.15 4.1 26.7   Thromboembolic risk factors ( age-28, HTN-1, CHF-1, Gender-1) for a CHADSVASc Score of >=4    Records and Results Reviewed   Past Medical History:  Diagnosis Date   AICD (automatic cardioverter/defibrillator) present 09/15/2014   Breast cancer ([place])    CHF (congestive heart failure) ([place])    Implantable cardioverter-defibrillator infection  s/p extraction 10/07/2014    Past Surgical History:  Procedure Laterality Date   CHOLECYSTECTOMY     device removal     defib   fiber tumor     masectomy     SUBQ ICD CHANGEOUT N/A 11/25/2020    Procedure: SUBQ ICD CHANGEOUT;  Surgeon: Deboraha Sprang, MD;  Location: [facility];  Service: Cardiovascular;  Laterality: N/A;    Current Outpatient Medications  Medication Sig Dispense Refill   acetaminophen (TYLENOL) 325 MG tablet Take 650 mg by mouth every 6 (six) hours as needed for moderate pain or mild pain.     carvedilol (COREG) 25 MG tablet Take 1 tablet (25 mg total) by mouth 2 (two) times daily with a meal. 180 tablet 2   empagliflozin (JARDIANCE) 10 MG TABS tablet Take 1 tablet (10 mg total) by mouth daily before breakfast. 90 tablet 3   eplerenone (INSPRA) 25 MG tablet Take 0.5 tablets (12.5 mg total) by mouth daily. 45 tablet 3   hydrALAZINE (APRESOLINE) 25 MG tablet Take 1 & 1/2 tablets (37.5 mg) by mouth 3 times daily 405 tablet 3   isosorbide mononitrate (IMDUR) 30 MG 24 hr tablet Take 1 tablet by mouth nightly 90 tablet 3   rosuvastatin (CRESTOR) 5 MG tablet Take 5 mg by mouth at bedtime.     sacubitril-valsartan (ENTRESTO) 97-103 MG Take 1 tablet by mouth 2 (two) times daily. 180 tablet 3   XARELTO 20 MG TABS tablet Take 1 tablet by mouth once daily with supper 90 tablet 3   furosemide (LASIX) 40 MG tablet Take 40 mg by mouth as needed. (Patient not taking: Reported on 02/28/2022)     No  current facility-administered medications for this visit.    No Known Allergies    Review of Systems negative except from HPI and PMH  Physical Exam BP 120/84 (BP Location: Right Arm, Patient Position: Sitting, Cuff Size: Large)   Pulse 64   Ht '5\' 6"'$  (1.676 m)   Wt 207 lb (93.9 kg)   SpO2 98%   BMI 33.41 kg/m  Well developed and well nourished in no acute distress HENT normal Neck supple with JVP-flat Clear Device pocket well healed; without hematoma or erythema.  There is no tethering  Regular rate and rhythm, no *** gallop No ***/*** murmur Abd-soft with active BS No Clubbing cyanosis *** edema Skin-warm and dry A & Oriented  Grossly normal sensory and motor  function  ECG ***  Assessment and  Plan Nonischemic cardiomyopathy  S ICD   The patient's device was interrogated.  The information was reviewed. No changes were made in the programming.  Vector primary  Ventricular tachycardia- slow   Atrial fibrillation-paroxysmal  DVT  Sleep disordered breathing  CHF chronic Systolic   No interval atrial fibrillation  No bleeding; continue Xarelto 20  No interval ventricular tachycardia  Cardiomyopathy stable, continue carvedilol 25 twice daily Entresto 97/103 and eplerenone 12.5.  Moreover with her blood pressure and cardiomyopathy, continue hydralazine 37.5 3 times daily and isosorbide 30  She is euvolemic.  Continue the furosemide at 40 as needed and the eplerenone daily

## 2022-02-28 NOTE — Patient Instructions (Signed)
Medication Instructions:  Your physician recommends that you continue on your current medications as directed. Please refer to the Current Medication list given to you today.  *If you need a refill on your cardiac medications before your next appointment, please call your pharmacy*   Lab Work: None ordered.  If you have labs (blood work) drawn today and your tests are completely normal, you will receive your results only by: MyChart Message (if you have MyChart) OR A paper copy in the mail If you have any lab test that is abnormal or we need to change your treatment, we will call you to review the results.   Testing/Procedures: None ordered.    Follow-Up: At [facility], you and your health needs are our priority.  As part of our continuing mission to provide you with exceptional heart care, we have created designated Provider Care Teams.  These Care Teams include your primary Cardiologist (physician) and Advanced Practice Providers (APPs -  Physician Assistants and Nurse Practitioners) who all work together to provide you with the care you need, when you need it.  We recommend signing up for the patient portal called "MyChart".  Sign up information is provided on this After Visit Summary.  MyChart is used to connect with patients for Virtual Visits (Telemedicine).  Patients are able to view lab/test results, encounter notes, upcoming appointments, etc.  Non-urgent messages can be sent to your provider as well.   To learn more about what you can do with MyChart, go to [email or web address].    Your next appointment:   12 months with Dr Klein's PA  Important Information About Sugar       

## 2022-03-15 DIAGNOSIS — I5022 Chronic systolic (congestive) heart failure: Secondary | ICD-10-CM | POA: Diagnosis not present

## 2022-03-15 DIAGNOSIS — I130 Hypertensive heart and chronic kidney disease with heart failure and stage 1 through stage 4 chronic kidney disease, or unspecified chronic kidney disease: Secondary | ICD-10-CM | POA: Diagnosis not present

## 2022-03-15 DIAGNOSIS — I208 Other forms of angina pectoris: Secondary | ICD-10-CM | POA: Diagnosis not present

## 2022-03-15 DIAGNOSIS — N183 Chronic kidney disease, stage 3 unspecified: Secondary | ICD-10-CM | POA: Diagnosis not present

## 2022-03-15 DIAGNOSIS — E785 Hyperlipidemia, unspecified: Secondary | ICD-10-CM | POA: Diagnosis not present

## 2022-03-15 DIAGNOSIS — I10 Essential (primary) hypertension: Secondary | ICD-10-CM | POA: Diagnosis not present

## 2022-03-15 DIAGNOSIS — I4891 Unspecified atrial fibrillation: Secondary | ICD-10-CM | POA: Diagnosis not present

## 2022-03-15 DIAGNOSIS — E261 Secondary hyperaldosteronism: Secondary | ICD-10-CM | POA: Diagnosis not present

## 2022-03-15 DIAGNOSIS — I2510 Atherosclerotic heart disease of native coronary artery without angina pectoris: Secondary | ICD-10-CM | POA: Diagnosis not present

## 2022-03-15 DIAGNOSIS — D6869 Other thrombophilia: Secondary | ICD-10-CM | POA: Diagnosis not present

## 2022-03-15 DIAGNOSIS — R32 Unspecified urinary incontinence: Secondary | ICD-10-CM | POA: Diagnosis not present

## 2022-03-15 DIAGNOSIS — E669 Obesity, unspecified: Secondary | ICD-10-CM | POA: Diagnosis not present

## 2022-03-15 NOTE — Progress Notes (Signed)
Remote ICD transmission.   

## 2022-03-20 DIAGNOSIS — N183 Chronic kidney disease, stage 3 unspecified: Secondary | ICD-10-CM | POA: Diagnosis not present

## 2022-03-20 DIAGNOSIS — E785 Hyperlipidemia, unspecified: Secondary | ICD-10-CM | POA: Diagnosis not present

## 2022-03-20 DIAGNOSIS — Z7901 Long term (current) use of anticoagulants: Secondary | ICD-10-CM | POA: Diagnosis not present

## 2022-03-20 DIAGNOSIS — I480 Paroxysmal atrial fibrillation: Secondary | ICD-10-CM | POA: Diagnosis not present

## 2022-03-20 DIAGNOSIS — Z0000 Encounter for general adult medical examination without abnormal findings: Secondary | ICD-10-CM | POA: Diagnosis not present

## 2022-03-20 DIAGNOSIS — Z6834 Body mass index (BMI) 34.0-34.9, adult: Secondary | ICD-10-CM | POA: Diagnosis not present

## 2022-03-20 DIAGNOSIS — Z95810 Presence of automatic (implantable) cardiac defibrillator: Secondary | ICD-10-CM | POA: Diagnosis not present

## 2022-03-20 DIAGNOSIS — I5042 Chronic combined systolic (congestive) and diastolic (congestive) heart failure: Secondary | ICD-10-CM | POA: Diagnosis not present

## 2022-03-20 DIAGNOSIS — I427 Cardiomyopathy due to drug and external agent: Secondary | ICD-10-CM | POA: Diagnosis not present

## 2022-04-05 DIAGNOSIS — Z780 Asymptomatic menopausal state: Secondary | ICD-10-CM | POA: Diagnosis not present

## 2022-04-26 ENCOUNTER — Telehealth: Payer: Self-pay

## 2022-04-26 NOTE — Telephone Encounter (Signed)
-----   Message from Deboraha Sprang, MD sent at 04/21/2022  2:05 PM EDT ----- Remote reviewed. This remote is abnormal for possibility of Afib Please have her come in for ECG

## 2022-04-26 NOTE — Telephone Encounter (Signed)
Spoke with pt and advised per Dr Caryl Comes home remote transmission shows possibility of Afib.  Appointment scheduled for  04/27/2022,Nurse Visit with ECG as requested.  Pt is anti-coagulated on Xarelto '20mg'$ .  Pt verbalizes understanding and thanked Therapist, sports for the call.

## 2022-04-27 ENCOUNTER — Ambulatory Visit (INDEPENDENT_AMBULATORY_CARE_PROVIDER_SITE_OTHER): Payer: PPO | Admitting: *Deleted

## 2022-04-27 VITALS — HR 65 | Ht 66.0 in | Wt 206.8 lb

## 2022-04-27 DIAGNOSIS — I480 Paroxysmal atrial fibrillation: Secondary | ICD-10-CM

## 2022-04-27 NOTE — Progress Notes (Signed)
   Nurse Visit   Date of Encounter: 04/27/2022 ID: Claudia [name], Claudia [name] July 03, 1956, MRN [id number]  PCP:  Iona Beard, MD   [facility] Providers Cardiologist:  None Electrophysiologist:  Virl Axe, MD      Visit Details   VS:  There were no vitals taken for this visit. , BMI There is no height or weight on file to calculate BMI.  Wt Readings from Last 3 Encounters:  02/28/22 207 lb (93.9 kg)  08/09/21 203 lb 9.6 oz (92.4 kg)  03/23/21 213 lb 6.4 oz (96.8 kg)     Reason for visit: ----- Message from Deboraha Sprang, MD sent at 04/21/2022  2:05 PM EDT ----- Remote reviewed. This remote is abnormal for possibility of Afib Please have her come in for ECG  Performed today: Vitals, EKG, Provider consulted:Mark Skains, and Education Changes (medications, testing, etc.) : no changes made, EKG shows no Afib, continue to monitor. Length of Visit: 30 minutes    Medications Adjustments/Labs and Tests Ordered: No orders of the defined types were placed in this encounter.  No orders of the defined types were placed in this encounter.    Signed, Darrell Jewel, RN  04/27/2022 2:46 PM

## 2022-04-27 NOTE — Patient Instructions (Addendum)
Medication Instructions:  Your physician recommends that you continue on your current medications as directed. Please refer to the Current Medication list given to you today. *If you need a refill on your cardiac medications before your next appointment, please call your pharmacy*  Lab Work: None. If you have labs (blood work) drawn today and your tests are completely normal, you will receive your results only by: [place] (if you have MyChart) OR A paper copy in the mail If you have any lab test that is abnormal or we need to change your treatment, we will call you to review the results.  Testing/Procedures: None.  Follow-Up: At [facility], you and your health needs are our priority.  As part of our continuing mission to provide you with exceptional heart care, we have created designated Provider Care Teams.  These Care Teams include your primary Cardiologist (physician) and Advanced Practice Providers (APPs -  Physician Assistants and Nurse Practitioners) who all work together to provide you with the care you need, when you need it.  Your physician wants you to follow-up in: Dr. Olin Pia nurse will contact you if needed.   We recommend signing up for the patient portal called "MyChart".  Sign up information is provided on this After Visit Summary.  MyChart is used to connect with patients for Virtual Visits (Telemedicine).  Patients are able to view lab/test results, encounter notes, upcoming appointments, etc.  Non-urgent messages can be sent to your provider as well.   To learn more about what you can do with MyChart, go to [email or web address].    Any Other Special Instructions Will Be Listed Below (If Applicable).

## 2022-05-23 DIAGNOSIS — H25013 Cortical age-related cataract, bilateral: Secondary | ICD-10-CM | POA: Diagnosis not present

## 2022-05-23 DIAGNOSIS — H5213 Myopia, bilateral: Secondary | ICD-10-CM | POA: Diagnosis not present

## 2022-05-23 DIAGNOSIS — H524 Presbyopia: Secondary | ICD-10-CM | POA: Diagnosis not present

## 2022-05-23 DIAGNOSIS — H52203 Unspecified astigmatism, bilateral: Secondary | ICD-10-CM | POA: Diagnosis not present

## 2022-05-23 DIAGNOSIS — H2513 Age-related nuclear cataract, bilateral: Secondary | ICD-10-CM | POA: Diagnosis not present

## 2022-05-31 ENCOUNTER — Ambulatory Visit (INDEPENDENT_AMBULATORY_CARE_PROVIDER_SITE_OTHER): Payer: HMO

## 2022-05-31 DIAGNOSIS — I428 Other cardiomyopathies: Secondary | ICD-10-CM

## 2022-05-31 DIAGNOSIS — N182 Chronic kidney disease, stage 2 (mild): Secondary | ICD-10-CM | POA: Diagnosis not present

## 2022-05-31 DIAGNOSIS — I130 Hypertensive heart and chronic kidney disease with heart failure and stage 1 through stage 4 chronic kidney disease, or unspecified chronic kidney disease: Secondary | ICD-10-CM | POA: Diagnosis not present

## 2022-05-31 DIAGNOSIS — I5042 Chronic combined systolic (congestive) and diastolic (congestive) heart failure: Secondary | ICD-10-CM | POA: Diagnosis not present

## 2022-06-04 LAB — CUP PACEART REMOTE DEVICE CHECK
Battery Remaining Percentage: 84 %
Date Time Interrogation Session: 20230831161300
Implantable Lead Implant Date: 20150819
Implantable Lead Location: 753862
Implantable Lead Model: 3010
Implantable Pulse Generator Implant Date: 20220225
Pulse Gen Serial Number: 153927

## 2022-06-21 NOTE — Progress Notes (Signed)
Remote ICD transmission.   

## 2022-06-25 ENCOUNTER — Other Ambulatory Visit (HOSPITAL_COMMUNITY): Payer: Self-pay

## 2022-06-25 MED ORDER — ISOSORBIDE MONONITRATE ER 30 MG PO TB24: 30.0000 mg | ORAL_TABLET | Freq: Every day | ORAL | 1 refills | Status: DC

## 2022-06-30 DIAGNOSIS — I130 Hypertensive heart and chronic kidney disease with heart failure and stage 1 through stage 4 chronic kidney disease, or unspecified chronic kidney disease: Secondary | ICD-10-CM | POA: Diagnosis not present

## 2022-06-30 DIAGNOSIS — N182 Chronic kidney disease, stage 2 (mild): Secondary | ICD-10-CM | POA: Diagnosis not present

## 2022-06-30 DIAGNOSIS — I5042 Chronic combined systolic (congestive) and diastolic (congestive) heart failure: Secondary | ICD-10-CM | POA: Diagnosis not present

## 2022-07-26 ENCOUNTER — Encounter (HOSPITAL_COMMUNITY): Payer: Self-pay | Admitting: Internal Medicine

## 2022-08-15 ENCOUNTER — Encounter: Payer: Self-pay | Admitting: Internal Medicine

## 2022-08-30 ENCOUNTER — Ambulatory Visit (INDEPENDENT_AMBULATORY_CARE_PROVIDER_SITE_OTHER): Payer: HMO

## 2022-08-30 DIAGNOSIS — I428 Other cardiomyopathies: Secondary | ICD-10-CM

## 2022-08-30 LAB — CUP PACEART REMOTE DEVICE CHECK
Battery Remaining Percentage: 81 %
Date Time Interrogation Session: 20231130070400
Implantable Lead Connection Status: 753985
Implantable Lead Implant Date: 20150819
Implantable Lead Location: 753862
Implantable Lead Model: 3010
Implantable Pulse Generator Implant Date: 20220225
Pulse Gen Serial Number: 153927

## 2022-09-19 ENCOUNTER — Telehealth (HOSPITAL_COMMUNITY): Payer: Self-pay | Admitting: Vascular Surgery

## 2022-09-19 NOTE — Progress Notes (Signed)
Remote ICD transmission.   

## 2022-09-19 NOTE — Telephone Encounter (Signed)
Lvm to move 12/27 appt

## 2022-09-26 ENCOUNTER — Encounter (HOSPITAL_COMMUNITY): Payer: HMO | Admitting: Internal Medicine

## 2022-10-05 ENCOUNTER — Other Ambulatory Visit (HOSPITAL_COMMUNITY): Payer: Self-pay

## 2022-10-05 MED ORDER — EPLERENONE 25 MG PO TABS: 12.5000 mg | ORAL_TABLET | Freq: Every day | ORAL | 3 refills | Status: DC

## 2022-10-05 MED ORDER — ENTRESTO 97-103 MG PO TABS: 1.0000 | ORAL_TABLET | Freq: Two times a day (BID) | ORAL | 3 refills | Status: DC

## 2022-10-05 MED ORDER — RIVAROXABAN 20 MG PO TABS: 20.0000 mg | ORAL_TABLET | Freq: Every day | ORAL | 3 refills | Status: DC

## 2022-10-16 DIAGNOSIS — I130 Hypertensive heart and chronic kidney disease with heart failure and stage 1 through stage 4 chronic kidney disease, or unspecified chronic kidney disease: Secondary | ICD-10-CM | POA: Diagnosis not present

## 2022-10-16 DIAGNOSIS — E785 Hyperlipidemia, unspecified: Secondary | ICD-10-CM | POA: Diagnosis not present

## 2022-10-16 DIAGNOSIS — N182 Chronic kidney disease, stage 2 (mild): Secondary | ICD-10-CM | POA: Diagnosis not present

## 2022-10-16 DIAGNOSIS — Z95810 Presence of automatic (implantable) cardiac defibrillator: Secondary | ICD-10-CM | POA: Diagnosis not present

## 2022-10-16 DIAGNOSIS — M79621 Pain in right upper arm: Secondary | ICD-10-CM | POA: Diagnosis not present

## 2022-10-16 DIAGNOSIS — I480 Paroxysmal atrial fibrillation: Secondary | ICD-10-CM | POA: Diagnosis not present

## 2022-10-16 DIAGNOSIS — I427 Cardiomyopathy due to drug and external agent: Secondary | ICD-10-CM | POA: Diagnosis not present

## 2022-10-16 DIAGNOSIS — I5042 Chronic combined systolic (congestive) and diastolic (congestive) heart failure: Secondary | ICD-10-CM | POA: Diagnosis not present

## 2022-10-17 DIAGNOSIS — Z6833 Body mass index (BMI) 33.0-33.9, adult: Secondary | ICD-10-CM | POA: Diagnosis not present

## 2022-10-17 DIAGNOSIS — Z853 Personal history of malignant neoplasm of breast: Secondary | ICD-10-CM | POA: Diagnosis not present

## 2022-10-17 DIAGNOSIS — I509 Heart failure, unspecified: Secondary | ICD-10-CM | POA: Diagnosis not present

## 2022-10-17 DIAGNOSIS — Z9221 Personal history of antineoplastic chemotherapy: Secondary | ICD-10-CM | POA: Diagnosis not present

## 2022-10-17 DIAGNOSIS — Z9012 Acquired absence of left breast and nipple: Secondary | ICD-10-CM | POA: Diagnosis not present

## 2022-10-17 DIAGNOSIS — Z923 Personal history of irradiation: Secondary | ICD-10-CM | POA: Diagnosis not present

## 2022-10-17 DIAGNOSIS — Z7901 Long term (current) use of anticoagulants: Secondary | ICD-10-CM | POA: Diagnosis not present

## 2022-10-17 DIAGNOSIS — I480 Paroxysmal atrial fibrillation: Secondary | ICD-10-CM | POA: Diagnosis not present

## 2022-10-22 ENCOUNTER — Ambulatory Visit (HOSPITAL_COMMUNITY)
Admission: RE | Admit: 2022-10-22 | Discharge: 2022-10-22 | Disposition: A | Discharge status: Home / Self Care | Payer: HMO | Source: Ambulatory Visit | Attending: Internal Medicine | Admitting: Internal Medicine

## 2022-10-22 ENCOUNTER — Encounter (HOSPITAL_COMMUNITY): Payer: Self-pay | Admitting: Internal Medicine

## 2022-10-22 VITALS — BP 130/76 | HR 80 | Ht 66.0 in | Wt 210.0 lb

## 2022-10-22 DIAGNOSIS — Z86718 Personal history of other venous thrombosis and embolism: Secondary | ICD-10-CM | POA: Diagnosis not present

## 2022-10-22 DIAGNOSIS — E785 Hyperlipidemia, unspecified: Secondary | ICD-10-CM | POA: Insufficient documentation

## 2022-10-22 DIAGNOSIS — Z7901 Long term (current) use of anticoagulants: Secondary | ICD-10-CM | POA: Diagnosis not present

## 2022-10-22 DIAGNOSIS — Z7984 Long term (current) use of oral hypoglycemic drugs: Secondary | ICD-10-CM | POA: Diagnosis not present

## 2022-10-22 DIAGNOSIS — I4729 Other ventricular tachycardia: Secondary | ICD-10-CM

## 2022-10-22 DIAGNOSIS — I480 Paroxysmal atrial fibrillation: Secondary | ICD-10-CM | POA: Diagnosis not present

## 2022-10-22 DIAGNOSIS — I10 Essential (primary) hypertension: Secondary | ICD-10-CM

## 2022-10-22 DIAGNOSIS — I110 Hypertensive heart disease with heart failure: Secondary | ICD-10-CM | POA: Insufficient documentation

## 2022-10-22 DIAGNOSIS — G4733 Obstructive sleep apnea (adult) (pediatric): Secondary | ICD-10-CM | POA: Diagnosis not present

## 2022-10-22 DIAGNOSIS — Z79899 Other long term (current) drug therapy: Secondary | ICD-10-CM | POA: Insufficient documentation

## 2022-10-22 DIAGNOSIS — Z853 Personal history of malignant neoplasm of breast: Secondary | ICD-10-CM | POA: Diagnosis not present

## 2022-10-22 DIAGNOSIS — I5022 Chronic systolic (congestive) heart failure: Secondary | ICD-10-CM

## 2022-10-22 NOTE — Addendum Note (Signed)
Encounter addended by: Lezlie Octave, RN on: 10/22/2022 4:16 PM  Actions taken: Order list changed, Diagnosis association updated, Clinical Note Signed

## 2022-10-22 NOTE — Progress Notes (Signed)
Patient ID: Claudia [name], female   DOB: December 27, 1955, 67 y.o.   MRN: [id number]  Primary Cardiologist: Dr Claudia [name] PCP: Claudia Beard, MD Oncology: Dr Claudia [name].  EP: Dr Claudia [name]  HPI: Claudia [name] is a 68 y/o woman with h/o breast cancer in 7672 and systolic HF due to chemotoxicity. She also h/o PAF and previous DVT for which she is on chronic anticoagulation. She moved from [place] in 07/2014 and saw Dr. Johnsie [name].   She developed breast cancer in 2008 and underwent chemotherapy and in the wake of this, she developed congestive heart failure with EF 20%. Cardiac cath with normal coronaries. She received an ICD. It became infected in the spring of 2015 with staph and it was extracted. She has undergone S ICD implantation following prolonged use of a LifeVest. She was ultimately transferred to [facility] in [place] in 01/2014 for consideration of heart transplantation. They diuresed her over 60 pounds and she improved markedly.   March 2017 after  ICD fired x3. Device was reprogrammed and beta blocker increased. ECHO repeated with EF 45%.   Echo 8/20 EF 35-40% ECHO 11/2015: EF 45% Grade I DD ECHO 10/07/14 and EF 40-45% with normal RV.   Echo 11/21 EF 35-40%  Echo 12/22 EF 35-40%  Here for yearly f/u. Says she feels pretty good. Does water aerobic 2-3x/week without problem. No CP, SOB, orthopnea, edema or PND. Compliant with meds.   ROS: All systems negative except as listed in HPI, PMH and Problem List.  SH:  Social History   Socioeconomic History   Marital status: Divorced    Spouse name: Not on file   Number of children: Not on file   Years of education: Not on file   Highest education level: Not on file  Occupational History   Not on file  Tobacco Use   Smoking status: Former   Smokeless tobacco: Never  Vaping Use   Vaping Use: Never used  Substance and Sexual Activity   Alcohol use: No   Drug use: No   Sexual activity: Not on file  Other Topics Concern   Not on file  Social History Narrative    Not on file   Social Determinants of Health   Financial Resource Strain: Not on file  Food Insecurity: No Food Insecurity (05/30/2020)   Hunger Vital Sign    Worried About Running Out of Food in the Last Year: Never true    Ran Out of Food in the Last Year: Never true  Transportation Needs: No Transportation Needs (05/30/2020)   PRAPARE - Hydrologist (Medical): No    Lack of Transportation (Non-Medical): No  Physical Activity: Not on file  Stress: Not on file  Social Connections: Not on file  Intimate Partner Violence: Not on file    FH:  Family History  Problem Relation Age of Onset   Diabetes Mother    Cancer Father     Past Medical History:  Diagnosis Date   AICD (automatic cardioverter/defibrillator) present 09/15/2014   Breast cancer ([place])    CHF (congestive heart failure) (HCC)    Implantable cardioverter-defibrillator infection  s/p extraction 10/07/2014    Current Outpatient Medications  Medication Sig Dispense Refill   acetaminophen (TYLENOL) 325 MG tablet Take 650 mg by mouth every 6 (six) hours as needed for moderate pain or mild pain.     carvedilol (COREG) 25 MG tablet Take 1 tablet (25 mg total) by mouth 2 (two) times daily with a meal.  180 tablet 2   empagliflozin (JARDIANCE) 10 MG TABS tablet Take 1 tablet (10 mg total) by mouth daily before breakfast. 90 tablet 3   eplerenone (INSPRA) 25 MG tablet Take 0.5 tablets (12.5 mg total) by mouth daily. 45 tablet 3   hydrALAZINE (APRESOLINE) 25 MG tablet Take 1 & 1/2 tablets (37.5 mg) by mouth 3 times daily 405 tablet 3   isosorbide mononitrate (IMDUR) 30 MG 24 hr tablet Take 1 tablet (30 mg total) by mouth at bedtime. 90 tablet 1   rivaroxaban (XARELTO) 20 MG TABS tablet Take 1 tablet (20 mg total) by mouth daily with supper. 90 tablet 3   rosuvastatin (CRESTOR) 5 MG tablet Take 5 mg by mouth at bedtime.     sacubitril-valsartan (ENTRESTO) 97-103 MG Take 1 tablet by mouth 2 (two) times  daily. 180 tablet 3   furosemide (LASIX) 40 MG tablet Take 40 mg by mouth as needed. (Patient not taking: Reported on 02/28/2022)     No current facility-administered medications for this encounter.    Vitals:   10/22/22 1527  BP: 130/76  Pulse: 80  SpO2: 99%  Weight: 95.3 kg (210 lb)  Height: '5\' 6"'$  (1.676 m)   Wt Readings from Last 3 Encounters:  10/22/22 95.3 kg (210 lb)  04/27/22 93.8 kg (206 lb 12.8 oz)  02/28/22 93.9 kg (207 lb)     PHYSICAL EXAM:  General:  Well appearing. No resp difficulty HEENT: normal Neck: supple. no JVD. Carotids 2+ bilat; no bruits. No lymphadenopathy or thryomegaly appreciated. Cor: PMI nondisplaced. Regular rate & rhythm. No rubs, gallops or murmurs. Lungs: clear Abdomen: obese soft, nontender, nondistended. No hepatosplenomegaly. No bruits or masses. Good bowel sounds. Extremities: no cyanosis, clubbing, rash, edema Neuro: alert & orientedx3, cranial nerves grossly intact. moves all 4 extremities w/o difficulty. Affect pleasant   ASSESSMENT & PLAN: 1. Chronic systolic HF   - likely due to chemotoxicity from breast CA. EF in January 2016 40-45% .  - Cath normal in [place].  - s/p [place] Sci SQ ICD No fitings - Echo 8/20 EF 35-40%. - Echo 11/21 EF 35-40% RV moderately reduced - Echo 12/22 EF 35-40% - NYHA I-II Volume looks good  - Continue carvedilol 25 mg bid. - Continue Imdur 30 mg  - Continue hydralazine 37.5 mg TID - Continue Entresto to 97/103 BID - Continue Inspra 25 mg daily - Continue Jardiance 10 mg - Doing very well on excellent GDMT. - Due for repeat echo - Recent bloodwork with PCP ok.   2. Breast CA  - s/p treatment 2008. No evidence of recurrence - per patient, no follow up needed  3. Paroxysmal AF  - Remains in NSR  On Xarelto no bleeding   4. H/o DVT - on Xarelto . No bleeding problems. Continue lifelong   5. Hyperlipidemia  - on crestor. Followed by Dr. Berdine [name]  6. H/O VT ICD discharge x3 11/2015. - No recent ICD  firings  7. HTN -Blood pressure well controlled. Continue current regimen.  8. OSA - wore CPAP in past but has not worn in years - encouraged her to set up sleep study  Glori Bickers, MD 4:08 PM

## 2022-10-22 NOTE — Patient Instructions (Signed)
No change in medications. Echocardiogram scheduled. Return to see Dr. Haroldine Laws in 12 months.

## 2022-10-29 ENCOUNTER — Other Ambulatory Visit (HOSPITAL_COMMUNITY): Payer: Self-pay | Admitting: Internal Medicine

## 2022-11-13 DIAGNOSIS — D696 Thrombocytopenia, unspecified: Secondary | ICD-10-CM | POA: Diagnosis not present

## 2022-11-13 DIAGNOSIS — Z7901 Long term (current) use of anticoagulants: Secondary | ICD-10-CM | POA: Diagnosis not present

## 2022-11-13 DIAGNOSIS — I209 Angina pectoris, unspecified: Secondary | ICD-10-CM | POA: Diagnosis not present

## 2022-11-13 DIAGNOSIS — I480 Paroxysmal atrial fibrillation: Secondary | ICD-10-CM | POA: Diagnosis not present

## 2022-11-13 DIAGNOSIS — Z6832 Body mass index (BMI) 32.0-32.9, adult: Secondary | ICD-10-CM | POA: Diagnosis not present

## 2022-11-13 DIAGNOSIS — I5020 Unspecified systolic (congestive) heart failure: Secondary | ICD-10-CM | POA: Diagnosis not present

## 2022-11-22 ENCOUNTER — Ambulatory Visit (HOSPITAL_COMMUNITY)
Admission: RE | Admit: 2022-11-22 | Discharge: 2022-11-22 | Disposition: A | Discharge status: Home / Self Care | Payer: PPO | Source: Ambulatory Visit | Attending: Internal Medicine | Admitting: Internal Medicine

## 2022-11-22 DIAGNOSIS — I5022 Chronic systolic (congestive) heart failure: Secondary | ICD-10-CM

## 2022-11-22 DIAGNOSIS — C50919 Malignant neoplasm of unspecified site of unspecified female breast: Secondary | ICD-10-CM | POA: Insufficient documentation

## 2022-11-22 DIAGNOSIS — Z7901 Long term (current) use of anticoagulants: Secondary | ICD-10-CM | POA: Insufficient documentation

## 2022-11-22 DIAGNOSIS — R000 Tachycardia, unspecified: Secondary | ICD-10-CM | POA: Diagnosis not present

## 2022-11-22 DIAGNOSIS — I340 Nonrheumatic mitral (valve) insufficiency: Secondary | ICD-10-CM | POA: Diagnosis not present

## 2022-11-22 DIAGNOSIS — I5021 Acute systolic (congestive) heart failure: Secondary | ICD-10-CM | POA: Diagnosis not present

## 2022-11-22 DIAGNOSIS — I429 Cardiomyopathy, unspecified: Secondary | ICD-10-CM | POA: Insufficient documentation

## 2022-11-22 DIAGNOSIS — I4891 Unspecified atrial fibrillation: Secondary | ICD-10-CM | POA: Insufficient documentation

## 2022-11-22 LAB — ECHOCARDIOGRAM COMPLETE
AR max vel: 2.25 cm2
AV Area VTI: 2.09 cm2
AV Area mean vel: 2.14 cm2
AV Mean grad: 4 mmHg
AV Peak grad: 7.2 mmHg
Ao pk vel: 1.34 m/s
Area-P 1/2: 3.85 cm2
Calc EF: 34.1 %
S' Lateral: 5.1 cm
Single Plane A2C EF: 39.8 %
Single Plane A4C EF: 31.2 %

## 2022-11-22 NOTE — Progress Notes (Signed)
Echocardiogram 2D Echocardiogram has been performed.  Claudia [name] 11/22/2022, 3:44 PM

## 2022-11-26 ENCOUNTER — Encounter: Payer: Self-pay | Admitting: Internal Medicine

## 2022-11-29 ENCOUNTER — Ambulatory Visit: Payer: PPO

## 2022-11-29 DIAGNOSIS — I428 Other cardiomyopathies: Secondary | ICD-10-CM

## 2022-11-29 DIAGNOSIS — I130 Hypertensive heart and chronic kidney disease with heart failure and stage 1 through stage 4 chronic kidney disease, or unspecified chronic kidney disease: Secondary | ICD-10-CM | POA: Diagnosis not present

## 2022-11-29 DIAGNOSIS — I5042 Chronic combined systolic (congestive) and diastolic (congestive) heart failure: Secondary | ICD-10-CM | POA: Diagnosis not present

## 2022-11-29 DIAGNOSIS — I480 Paroxysmal atrial fibrillation: Secondary | ICD-10-CM | POA: Diagnosis not present

## 2022-11-29 DIAGNOSIS — N182 Chronic kidney disease, stage 2 (mild): Secondary | ICD-10-CM | POA: Diagnosis not present

## 2022-11-29 LAB — CUP PACEART REMOTE DEVICE CHECK
Battery Remaining Percentage: 78 %
Date Time Interrogation Session: 20240229074900
Implantable Lead Connection Status: 753985
Implantable Lead Implant Date: 20150819
Implantable Lead Location: 753862
Implantable Lead Model: 3010
Implantable Pulse Generator Implant Date: 20220225
Pulse Gen Serial Number: 153927

## 2022-12-28 NOTE — Progress Notes (Signed)
Remote ICD transmission.   

## 2023-01-08 DIAGNOSIS — Z1231 Encounter for screening mammogram for malignant neoplasm of breast: Secondary | ICD-10-CM | POA: Diagnosis not present

## 2023-01-28 ENCOUNTER — Encounter: Payer: Self-pay | Admitting: Internal Medicine

## 2023-01-29 DIAGNOSIS — I10 Essential (primary) hypertension: Secondary | ICD-10-CM | POA: Diagnosis not present

## 2023-01-29 DIAGNOSIS — C50912 Malignant neoplasm of unspecified site of left female breast: Secondary | ICD-10-CM | POA: Diagnosis not present

## 2023-01-29 DIAGNOSIS — I5042 Chronic combined systolic (congestive) and diastolic (congestive) heart failure: Secondary | ICD-10-CM | POA: Diagnosis not present

## 2023-01-29 DIAGNOSIS — E7800 Pure hypercholesterolemia, unspecified: Secondary | ICD-10-CM | POA: Diagnosis not present

## 2023-01-29 DIAGNOSIS — I427 Cardiomyopathy due to drug and external agent: Secondary | ICD-10-CM | POA: Diagnosis not present

## 2023-01-29 DIAGNOSIS — I480 Paroxysmal atrial fibrillation: Secondary | ICD-10-CM | POA: Diagnosis not present

## 2023-01-29 DIAGNOSIS — Z95810 Presence of automatic (implantable) cardiac defibrillator: Secondary | ICD-10-CM | POA: Diagnosis not present

## 2023-02-28 ENCOUNTER — Ambulatory Visit (INDEPENDENT_AMBULATORY_CARE_PROVIDER_SITE_OTHER): Payer: PPO

## 2023-02-28 DIAGNOSIS — I428 Other cardiomyopathies: Secondary | ICD-10-CM

## 2023-02-28 LAB — CUP PACEART REMOTE DEVICE CHECK
Battery Remaining Percentage: 76 %
Date Time Interrogation Session: 20240530070600
Implantable Lead Connection Status: 753985
Implantable Lead Implant Date: 20150819
Implantable Lead Location: 753862
Implantable Lead Model: 3010
Implantable Pulse Generator Implant Date: 20220225
Pulse Gen Serial Number: 153927

## 2023-03-19 ENCOUNTER — Encounter: Payer: Self-pay | Admitting: Internal Medicine

## 2023-03-19 ENCOUNTER — Ambulatory Visit: Payer: PPO | Attending: Internal Medicine | Admitting: Internal Medicine

## 2023-03-19 VITALS — BP 124/78 | HR 74 | Ht 66.0 in | Wt 206.0 lb

## 2023-03-19 DIAGNOSIS — I480 Paroxysmal atrial fibrillation: Secondary | ICD-10-CM

## 2023-03-19 DIAGNOSIS — I428 Other cardiomyopathies: Secondary | ICD-10-CM | POA: Diagnosis not present

## 2023-03-19 LAB — CUP PACEART INCLINIC DEVICE CHECK
Date Time Interrogation Session: 20240618164926
Implantable Lead Connection Status: 753985
Implantable Lead Implant Date: 20150819
Implantable Lead Location: 753862
Implantable Lead Model: 3010
Implantable Pulse Generator Implant Date: 20220225
Pulse Gen Serial Number: 153927

## 2023-03-19 NOTE — Patient Instructions (Signed)
Medication Instructions:  Your physician recommends that you continue on your current medications as directed. Please refer to the Current Medication list given to you today.  *If you need a refill on your cardiac medications before your next appointment, please call your pharmacy*    Follow-Up: At [facility], you and your health needs are our priority.  As part of our continuing mission to provide you with exceptional heart care, we have created designated Provider Care Teams.  These Care Teams include your primary Cardiologist (physician) and Advanced Practice Providers (APPs -  Physician Assistants and Nurse Practitioners) who all work together to provide you with the care you need, when you need it.   Your next appointment:   1 year  Provider:   Sherryl Manges, MD

## 2023-03-19 NOTE — Progress Notes (Signed)
Patient Care Team: Mirna Mires, MD as PCP - General (Family Medicine) Duke Salvia, MD as PCP - Electrophysiology (Cardiology) Serena Croissant, MD as Consulting Physician (Hematology and Oncology) Bensimhon, Bevelyn Buckles, MD as Consulting Physician (Cardiology) Duke Salvia, MD as Consulting Physician (Cardiology)   HPI  Claudia [name] is a 67 y.o. female seen in follow-up for SQ ICD implanted 2015 for primary prevention for nonischemic cardiomyopathy attributed to chemotherapy for breast cancer.. Gen change 2022  She had previously implanted transvenous device removed because of infection.  Also remote history of atrial fibrillation for which she is anticoagulated with Rivaroxaban   Appropriate ICD therapy 3/17, had slow VT below detection with some under sensing, followed by over sensing and then shock.  Her device was reprogrammed to increase her detection rate to 250 bpm to decrease inappropriate shocks.  She subsequently had shocks for T wave oversensing requiring further reprogramming    The patient denies chest pain, shortness of breath, nocturnal dyspne, orthopnea or peripheral edema.  There have been no palpitations, lightheadedness or syncope.  Has been traveling.  Went with her daughter to [place] in to see her son and his children in [place].Marland Kitchen    DATE TEST EF   4/15 Echo   15-20 %   8/20 Echo   35-40 %   11/21 Echo  35-40%   12/22 Echo   35-40% MR Mild-mod   Date Cr K Hgb  10/20 1.05 4.1 12.4  11/21  0.99 3.9 12.8  2/22 1.15 4.1 12.9   Thromboembolic risk factors ( age-73, HTN-1, CHF-1, Gender-1) for a CHADSVASc Score of >=4    Records and Results Reviewed   Past Medical History:  Diagnosis Date   AICD (automatic cardioverter/defibrillator) present 09/15/2014   Breast cancer (HCC)    CHF (congestive heart failure) (HCC)    Implantable cardioverter-defibrillator infection  s/p extraction 10/07/2014    Past Surgical History:  Procedure Laterality Date    CHOLECYSTECTOMY     device removal     defib   fiber tumor     masectomy     SUBQ ICD CHANGEOUT N/A 11/25/2020   Procedure: SUBQ ICD CHANGEOUT;  Surgeon: Duke Salvia, MD;  Location: [facility] INVASIVE CV LAB;  Service: Cardiovascular;  Laterality: N/A;    Current Outpatient Medications  Medication Sig Dispense Refill   acetaminophen (TYLENOL) 325 MG tablet Take 650 mg by mouth every 6 (six) hours as needed for moderate pain or mild pain.     carvedilol (COREG) 25 MG tablet Take 1 tablet (25 mg total) by mouth 2 (two) times daily with a meal. 180 tablet 2   eplerenone (INSPRA) 25 MG tablet Take 0.5 tablets (12.5 mg total) by mouth daily. 45 tablet 3   furosemide (LASIX) 40 MG tablet Take 40 mg by mouth as needed.     hydrALAZINE (APRESOLINE) 25 MG tablet Take 1 & 1/2 tablets (37.5 mg) by mouth 3 times daily 405 tablet 3   isosorbide mononitrate (IMDUR) 30 MG 24 hr tablet Take 1 tablet (30 mg total) by mouth at bedtime. 90 tablet 1   JARDIANCE 10 MG TABS tablet Take 1 tablet by mouth daily before breakfast 90 tablet 2   rivaroxaban (XARELTO) 20 MG TABS tablet Take 1 tablet (20 mg total) by mouth daily with supper. 90 tablet 3   rosuvastatin (CRESTOR) 5 MG tablet Take 5 mg by mouth at bedtime.     sacubitril-valsartan (ENTRESTO) 97-103 MG Take  1 tablet by mouth 2 (two) times daily. 180 tablet 3   No current facility-administered medications for this visit.    No Known Allergies    Review of Systems negative except from HPI and PMH  Physical Exam BP 124/78   Pulse 74   Ht 5\' 6"  (1.676 m)   Wt 206 lb (93.4 kg)   SpO2 98%   BMI 33.25 kg/m  Well developed and well nourished in no acute distress HENT normal Neck supple with JVP-flat Clear Device pocket well healed; without hematoma or erythema.  There is no tethering  Regular rate and rhythm, no  gallop No  murmur Abd-soft with active BS No Clubbing cyanosis  edema Skin-warm and dry A & Oriented  Grossly normal sensory and motor  function  ECG sinus at 70 Normal 20/10/40 Inferolateral CT changes concerning for ischemia  Device function is normal. Programming changes none See Paceart for details    Assessment and  Plan Nonischemic cardiomyopathy  S ICD      Ventricular tachycardia- slow   Atrial fibrillation-paroxysmal  DVT  Sleep disordered breathing  CHF chronic Systolic   No interval atrial fibrillation, no DVT no bleeding continue Xarelto  No interval ventricular tachycardia  For cardiomyopathy continue carvedilol Jardiance eplerenone Entresto and isosorbide.  Will reach out to Dr. Dorthea Cove regarding the dose of eplerenone

## 2023-03-19 NOTE — Progress Notes (Signed)
Remote ICD transmission.   

## 2023-04-10 ENCOUNTER — Other Ambulatory Visit (HOSPITAL_COMMUNITY): Payer: Self-pay

## 2023-04-10 MED ORDER — HYDRALAZINE HCL 25 MG PO TABS: ORAL_TABLET | ORAL | 3 refills | Status: DC

## 2023-04-10 MED ORDER — CARVEDILOL 25 MG PO TABS: 25.0000 mg | ORAL_TABLET | Freq: Two times a day (BID) | ORAL | 3 refills | Status: DC

## 2023-04-16 ENCOUNTER — Other Ambulatory Visit (HOSPITAL_COMMUNITY): Payer: Self-pay | Admitting: Pharmacist

## 2023-04-16 MED ORDER — HYDRALAZINE HCL 25 MG PO TABS: ORAL_TABLET | ORAL | 3 refills | Status: DC

## 2023-04-16 MED ORDER — CARVEDILOL 25 MG PO TABS: 25.0000 mg | ORAL_TABLET | Freq: Two times a day (BID) | ORAL | 3 refills | Status: DC

## 2023-04-30 DIAGNOSIS — E785 Hyperlipidemia, unspecified: Secondary | ICD-10-CM | POA: Diagnosis not present

## 2023-04-30 DIAGNOSIS — Z0001 Encounter for general adult medical examination with abnormal findings: Secondary | ICD-10-CM | POA: Diagnosis not present

## 2023-04-30 DIAGNOSIS — I5042 Chronic combined systolic (congestive) and diastolic (congestive) heart failure: Secondary | ICD-10-CM | POA: Diagnosis not present

## 2023-04-30 DIAGNOSIS — Z95810 Presence of automatic (implantable) cardiac defibrillator: Secondary | ICD-10-CM | POA: Diagnosis not present

## 2023-04-30 DIAGNOSIS — Z6832 Body mass index (BMI) 32.0-32.9, adult: Secondary | ICD-10-CM | POA: Diagnosis not present

## 2023-04-30 DIAGNOSIS — C50912 Malignant neoplasm of unspecified site of left female breast: Secondary | ICD-10-CM | POA: Diagnosis not present

## 2023-04-30 DIAGNOSIS — E7800 Pure hypercholesterolemia, unspecified: Secondary | ICD-10-CM | POA: Diagnosis not present

## 2023-04-30 DIAGNOSIS — I480 Paroxysmal atrial fibrillation: Secondary | ICD-10-CM | POA: Diagnosis not present

## 2023-04-30 DIAGNOSIS — I427 Cardiomyopathy due to drug and external agent: Secondary | ICD-10-CM | POA: Diagnosis not present

## 2023-04-30 DIAGNOSIS — I10 Essential (primary) hypertension: Secondary | ICD-10-CM | POA: Diagnosis not present

## 2023-05-15 DIAGNOSIS — I5020 Unspecified systolic (congestive) heart failure: Secondary | ICD-10-CM | POA: Diagnosis not present

## 2023-05-15 DIAGNOSIS — E261 Secondary hyperaldosteronism: Secondary | ICD-10-CM | POA: Diagnosis not present

## 2023-05-30 ENCOUNTER — Ambulatory Visit (INDEPENDENT_AMBULATORY_CARE_PROVIDER_SITE_OTHER): Payer: PPO

## 2023-05-30 DIAGNOSIS — I428 Other cardiomyopathies: Secondary | ICD-10-CM | POA: Diagnosis not present

## 2023-05-30 LAB — CUP PACEART REMOTE DEVICE CHECK
Battery Remaining Percentage: 73 %
Date Time Interrogation Session: 20240829132200
HighPow Impedance: 100 Ohm
Implantable Lead Connection Status: 753985
Implantable Lead Implant Date: 20150819
Implantable Lead Location: 753862
Implantable Lead Model: 3010
Implantable Pulse Generator Implant Date: 20220225
Pulse Gen Serial Number: 153927

## 2023-06-06 NOTE — Progress Notes (Signed)
Remote ICD transmission.   

## 2023-07-11 ENCOUNTER — Other Ambulatory Visit: Payer: Self-pay | Admitting: Family Medicine

## 2023-07-11 ENCOUNTER — Ambulatory Visit
Admission: RE | Admit: 2023-07-11 | Discharge: 2023-07-11 | Disposition: A | Discharge status: Home / Self Care | Payer: PPO | Source: Ambulatory Visit | Attending: Family Medicine | Admitting: Family Medicine

## 2023-07-11 DIAGNOSIS — E782 Mixed hyperlipidemia: Secondary | ICD-10-CM | POA: Diagnosis not present

## 2023-07-11 DIAGNOSIS — I10 Essential (primary) hypertension: Secondary | ICD-10-CM | POA: Diagnosis not present

## 2023-07-11 DIAGNOSIS — M25512 Pain in left shoulder: Secondary | ICD-10-CM

## 2023-07-11 DIAGNOSIS — Z6833 Body mass index (BMI) 33.0-33.9, adult: Secondary | ICD-10-CM | POA: Diagnosis not present

## 2023-07-11 DIAGNOSIS — I119 Hypertensive heart disease without heart failure: Secondary | ICD-10-CM | POA: Diagnosis not present

## 2023-07-11 DIAGNOSIS — R7303 Prediabetes: Secondary | ICD-10-CM | POA: Diagnosis not present

## 2023-07-11 DIAGNOSIS — M19012 Primary osteoarthritis, left shoulder: Secondary | ICD-10-CM | POA: Diagnosis not present

## 2023-07-11 DIAGNOSIS — G8929 Other chronic pain: Secondary | ICD-10-CM | POA: Diagnosis not present

## 2023-07-16 DIAGNOSIS — M79622 Pain in left upper arm: Secondary | ICD-10-CM | POA: Diagnosis not present

## 2023-08-06 DIAGNOSIS — M79622 Pain in left upper arm: Secondary | ICD-10-CM | POA: Diagnosis not present

## 2023-08-22 ENCOUNTER — Telehealth: Payer: Self-pay

## 2023-08-22 NOTE — Telephone Encounter (Signed)
Ongoing AF event since 08/18/23.  (BSX, subcue ICD) On Xarelto Presenting AF w/RVR rates : 100-150 V bpm.   Patient states that she has not felt any palpitations, dizziness, SOB or fatigue. But, she is sick with, she thinks, is the flu and has been taking OTC cold/flu medication. I have encouraged her to stop those and talk with her pharmacist for recommendations that are safe for her to take with hx of arrhythmias.   Confirms she is taking all of her medications (Carvedilol, Xarelto)etc, no missed doses.  Dr. Graciela Husbands is out of the office will cc: to Dr. Nelly Laurence if any other changes in interim. We will continue to monitor.

## 2023-08-26 ENCOUNTER — Telehealth: Payer: Self-pay

## 2023-08-26 ENCOUNTER — Telehealth: Payer: Self-pay | Admitting: Internal Medicine

## 2023-08-26 NOTE — Telephone Encounter (Signed)
Patient states she is at work now. She will send transmission when she gets home this afternoon around 2pm and call us once sent.

## 2023-08-26 NOTE — Telephone Encounter (Signed)
Pt returning nurses phone call. Please advise ?

## 2023-08-26 NOTE — Telephone Encounter (Signed)
Called pt lvm for her to call DC so we can get a transmission

## 2023-08-26 NOTE — Telephone Encounter (Signed)
Transmission received. I told her the nurse will give her a call back.

## 2023-08-26 NOTE — Telephone Encounter (Signed)
See other phone encounter.  

## 2023-08-26 NOTE — Telephone Encounter (Addendum)
Appears patient is still in AF.  Will need referral to AF clinic per Dr. Graciela Husbands to consider DCCV.  Spoke to patient and let her know AF clinic will contact her tomorrow to set up appt as soon as possible.  She continues to be ASX.   V rates are fast on presenting reaching 150-160's at times.

## 2023-08-26 NOTE — Telephone Encounter (Signed)
error 

## 2023-08-26 NOTE — Telephone Encounter (Signed)
See other phone encounter for details.

## 2023-08-27 ENCOUNTER — Encounter (HOSPITAL_COMMUNITY): Payer: Self-pay | Admitting: Physician Assistant

## 2023-08-27 ENCOUNTER — Ambulatory Visit (HOSPITAL_COMMUNITY)
Admission: RE | Admit: 2023-08-27 | Discharge: 2023-08-27 | Disposition: A | Discharge status: Home / Self Care | Payer: PPO | Source: Ambulatory Visit | Attending: Physician Assistant | Admitting: Physician Assistant

## 2023-08-27 VITALS — BP 128/90 | HR 105 | Ht 66.0 in | Wt 204.8 lb

## 2023-08-27 DIAGNOSIS — I5022 Chronic systolic (congestive) heart failure: Secondary | ICD-10-CM | POA: Insufficient documentation

## 2023-08-27 DIAGNOSIS — D6869 Other thrombophilia: Secondary | ICD-10-CM | POA: Insufficient documentation

## 2023-08-27 DIAGNOSIS — Z79899 Other long term (current) drug therapy: Secondary | ICD-10-CM | POA: Diagnosis not present

## 2023-08-27 DIAGNOSIS — I4819 Other persistent atrial fibrillation: Secondary | ICD-10-CM

## 2023-08-27 DIAGNOSIS — Z7901 Long term (current) use of anticoagulants: Secondary | ICD-10-CM | POA: Diagnosis not present

## 2023-08-27 DIAGNOSIS — Z95810 Presence of automatic (implantable) cardiac defibrillator: Secondary | ICD-10-CM | POA: Diagnosis not present

## 2023-08-27 DIAGNOSIS — G4733 Obstructive sleep apnea (adult) (pediatric): Secondary | ICD-10-CM | POA: Diagnosis not present

## 2023-08-27 DIAGNOSIS — I110 Hypertensive heart disease with heart failure: Secondary | ICD-10-CM | POA: Insufficient documentation

## 2023-08-27 LAB — BASIC METABOLIC PANEL
Anion gap: 11 (ref 5–15)
BUN: 13 mg/dL (ref 8–23)
CO2: 19 mmol/L — ABNORMAL LOW (ref 22–32)
Calcium: 9.3 mg/dL (ref 8.9–10.3)
Chloride: 108 mmol/L (ref 98–111)
Creatinine, Ser: 1.06 mg/dL — ABNORMAL HIGH (ref 0.44–1.00)
GFR, Estimated: 58 mL/min — ABNORMAL LOW (ref >60)
Glucose, Bld: 97 mg/dL (ref 70–99)
Potassium: 3.6 mmol/L (ref 3.5–5.1)
Sodium: 138 mmol/L (ref 135–145)

## 2023-08-27 LAB — CBC
HCT: 42.1 % (ref 36.0–46.0)
Hemoglobin: 13.1 g/dL (ref 12.0–15.0)
MCH: 26.6 pg (ref 26.0–34.0)
MCHC: 31.1 g/dL (ref 30.0–36.0)
MCV: 85.6 fL (ref 80.0–100.0)
Platelets: 253 10*3/uL (ref 150–400)
RBC: 4.92 MIL/uL (ref 3.87–5.11)
RDW: 14 % (ref 11.5–15.5)
WBC: 3.5 10*3/uL — ABNORMAL LOW (ref 4.0–10.5)
nRBC: 0 % (ref 0.0–0.2)

## 2023-08-27 LAB — TSH: TSH: 2.063 u[IU]/mL (ref 0.350–4.500)

## 2023-08-27 NOTE — H&P (View-Only) (Signed)
 Primary Care Physician: Mirna Mires, MD Primary Cardiologist: Arvilla Meres, MD Electrophysiologist: Sherryl Manges, MD  Referring Physician: Dr Julianne Rice is a 67 y.o. female with a history of h/o breast cancer in 2008 and systolic HF due to chemotoxicity s/p ICD, HTN, HLD, OSA, atrial fibrillation who presents for follow up in the [facility].  The patient was initially diagnosed with atrial fibrillation remotely and is on Xarelto for a CHADS2VASC score of 4. The device clinic received an alert for an ongoing afib episode starting 08/18/23. Patient is unaware of her afib. She was ill with an URI at the time of her afib onset.   On follow up today, patient remains in persistent afib. She is asymptomatic. No bleeding issues on anticoagulation.   Today, she denies symptoms of palpitations, chest pain, shortness of breath, orthopnea, PND, lower extremity edema, dizziness, presyncope, syncope, snoring, daytime somnolence, bleeding, or neurologic sequela. The patient is tolerating medications without difficulties and is otherwise without complaint today.    Atrial Fibrillation Risk Factors:  she does have symptoms or diagnosis of sleep apnea. she does not have a history of rheumatic fever.   Atrial Fibrillation Management history:  Previous antiarrhythmic drugs: none Previous cardioversions: none Previous ablations: none Anticoagulation history: Xarelto   ROS- All systems are reviewed and negative except as per the HPI above.  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     carvedilol (COREG) 25 MG tablet Take 1 tablet (25 mg total) by mouth 2 (two) times daily with a meal. 180 tablet 3   eplerenone (INSPRA) 25 MG tablet Take 0.5 tablets (12.5 mg total) by mouth daily. 45 tablet 3   furosemide (LASIX) 40 MG tablet Take 40 mg by mouth as needed.     hydrALAZINE (APRESOLINE) 25 MG tablet Take 1 & 1/2 tablets (37.5 mg) by mouth 3 times daily 405 tablet 3   isosorbide mononitrate (IMDUR) 30 MG 24 hr tablet Take 1 tablet (30 mg total) by mouth at bedtime. 90 tablet 1   JARDIANCE 10 MG TABS tablet Take 1 tablet by mouth daily before breakfast 90 tablet 2   rivaroxaban (XARELTO) 20 MG TABS tablet Take 1 tablet (20 mg total) by mouth daily with supper. 90 tablet 3   rosuvastatin (CRESTOR) 5 MG tablet Take 5 mg by mouth at bedtime.     sacubitril-valsartan (ENTRESTO) 97-103 MG Take 1 tablet by mouth 2 (two) times daily. 180 tablet 3   No current facility-administered medications for this encounter.    Physical Exam: BP (!) 128/90   Pulse (!) 105   Ht 5\' 6"  (1.676 m)   Wt 92.9 kg   BMI 33.06 kg/m   GEN: Well nourished, well developed in no acute distress NECK: No JVD; No carotid bruits CARDIAC: Irregularly irregular rate and rhythm, no murmurs, rubs, gallops RESPIRATORY:  Clear to auscultation without rales, wheezing or rhonchi  ABDOMEN: Soft, non-tender, non-distended EXTREMITIES:  No edema; No deformity   Wt Readings from Last 3 Encounters:  08/27/23 92.9 kg  03/19/23 93.4 kg  10/22/22 95.3 kg  EKG today demonstrates  Afib, slow R wave prog Vent. rate 105 BPM PR interval * ms QRS duration 94 ms QT/QTcB 360/475 ms  Echo 11/22/22 demonstrated   1. EF reported 35-40% on TTE 12/27/222 side by side comparison slightly  worse now . Left ventricular ejection fraction, by estimation, is 30 to  35%. The left ventricle has moderately decreased function. The left  ventricle demonstrates global hypokinesis. The left ventricular internal cavity  size was moderately dilated. Left ventricular diastolic parameters are consistent with Grade I diastolic dysfunction (impaired relaxation). The average left ventricular global longitudinal strain is -9.7  %. The global longitudinal strain is abnormal.   2. Right ventricular systolic function is normal. The right ventricular  size is normal.   3. Left atrial size was moderately dilated.   4. Eccentric functional MR with restricted posterior leaflet similar to  TTE done 09/26/21. The mitral valve is abnormal. Mild to moderate mitral  valve regurgitation. No evidence of mitral stenosis.   5. The aortic valve is tricuspid. Aortic valve regurgitation is not  visualized. No aortic stenosis is present.   6. The inferior vena cava is normal in size with greater than 50%  respiratory variability, suggesting right atrial pressure of 3 mmHg.    CHA2DS2-VASc Score = 4  The patient's score is based upon: CHF History: 1 HTN History: 1 Diabetes History: 0 Stroke History: 0 Vascular Disease History: 0 Age Score: 1 Gender Score: 1       ASSESSMENT AND PLAN: Persistent Atrial Fibrillation (ICD10:  I48.19) The patient's CHA2DS2-VASc score is 4, indicating a 4.8% annual risk of stroke.   Patient in persistent afib since 11/17 We discussed rhythm control options today. After discussing the risks and benefits, will arrange for DCCV. Continue carvedilol 25 mg BID Continue Xarelto 20 mg daily Check bmet/cbc/TSH today  Secondary Hypercoagulable State (ICD10:  D68.69) The patient is at significant risk for stroke/thromboembolism based upon her CHA2DS2-VASc Score of 4.  Continue Rivaroxaban (Xarelto).   Chronic HFrEF Chemotoxicity S/p ICD Fluid status appears stable today  HTN Stable on current regimen  OSA  Patient has not been on CPAP for years.     Follow up in the AF clinic post DCCV.   Informed Consent   Shared Decision Making/Informed Consent The risks (stroke, cardiac arrhythmias  rarely resulting in the need for a temporary or permanent pacemaker, skin irritation or burns and complications associated with conscious sedation including aspiration, arrhythmia, respiratory failure and death), benefits (restoration of normal sinus rhythm) and alternatives of a direct current cardioversion were explained in detail to Ms. Claudia [name] and she agrees to proceed.         Jorja Loa PA-C Afib Clinic [facility] [address] [place], [place] [address] [phone]

## 2023-08-27 NOTE — Progress Notes (Signed)
Primary Care Physician: Mirna Mires, MD Primary Cardiologist: Arvilla Meres, MD Electrophysiologist: Sherryl Manges, MD  Referring Physician: Dr Julianne Rice is a 67 y.o. female with a history of h/o breast cancer in 2008 and systolic HF due to chemotoxicity s/p ICD, HTN, HLD, OSA, atrial fibrillation who presents for follow up in the [facility].  The patient was initially diagnosed with atrial fibrillation remotely and is on Xarelto for a CHADS2VASC score of 4. The device clinic received an alert for an ongoing afib episode starting 08/18/23. Patient is unaware of her afib. She was ill with an URI at the time of her afib onset.   On follow up today, patient remains in persistent afib. She is asymptomatic. No bleeding issues on anticoagulation.   Today, she denies symptoms of palpitations, chest pain, shortness of breath, orthopnea, PND, lower extremity edema, dizziness, presyncope, syncope, snoring, daytime somnolence, bleeding, or neurologic sequela. The patient is tolerating medications without difficulties and is otherwise without complaint today.    Atrial Fibrillation Risk Factors:  she does have symptoms or diagnosis of sleep apnea. she does not have a history of rheumatic fever.   Atrial Fibrillation Management history:  Previous antiarrhythmic drugs: none Previous cardioversions: none Previous ablations: none Anticoagulation history: Xarelto   ROS- All systems are reviewed and negative except as per the HPI above.  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     carvedilol (COREG) 25 MG tablet Take 1 tablet (25 mg total) by mouth 2 (two) times daily with a meal. 180 tablet 3   eplerenone (INSPRA) 25 MG tablet Take 0.5 tablets (12.5 mg total) by mouth daily. 45 tablet 3   furosemide (LASIX) 40 MG tablet Take 40 mg by mouth as needed.     hydrALAZINE (APRESOLINE) 25 MG tablet Take 1 & 1/2 tablets (37.5 mg) by mouth 3 times daily 405 tablet 3   isosorbide mononitrate (IMDUR) 30 MG 24 hr tablet Take 1 tablet (30 mg total) by mouth at bedtime. 90 tablet 1   JARDIANCE 10 MG TABS tablet Take 1 tablet by mouth daily before breakfast 90 tablet 2   rivaroxaban (XARELTO) 20 MG TABS tablet Take 1 tablet (20 mg total) by mouth daily with supper. 90 tablet 3   rosuvastatin (CRESTOR) 5 MG tablet Take 5 mg by mouth at bedtime.     sacubitril-valsartan (ENTRESTO) 97-103 MG Take 1 tablet by mouth 2 (two) times daily. 180 tablet 3   No current facility-administered medications for this encounter.    Physical Exam: BP (!) 128/90   Pulse (!) 105   Ht 5\' 6"  (1.676 m)   Wt 92.9 kg   BMI 33.06 kg/m   GEN: Well nourished, well developed in no acute distress NECK: No JVD; No carotid bruits CARDIAC: Irregularly irregular rate and rhythm, no murmurs, rubs, gallops RESPIRATORY:  Clear to auscultation without rales, wheezing or rhonchi  ABDOMEN: Soft, non-tender, non-distended EXTREMITIES:  No edema; No deformity   Wt Readings from Last 3 Encounters:  08/27/23 92.9 kg  03/19/23 93.4 kg  10/22/22 95.3 kg  EKG today demonstrates  Afib, slow R wave prog Vent. rate 105 BPM PR interval * ms QRS duration 94 ms QT/QTcB 360/475 ms  Echo 11/22/22 demonstrated   1. EF reported 35-40% on TTE 12/27/222 side by side comparison slightly  worse now . Left ventricular ejection fraction, by estimation, is 30 to  35%. The left ventricle has moderately decreased function. The left  ventricle demonstrates global hypokinesis. The left ventricular internal cavity  size was moderately dilated. Left ventricular diastolic parameters are consistent with Grade I diastolic dysfunction (impaired relaxation). The average left ventricular global longitudinal strain is -9.7  %. The global longitudinal strain is abnormal.   2. Right ventricular systolic function is normal. The right ventricular  size is normal.   3. Left atrial size was moderately dilated.   4. Eccentric functional MR with restricted posterior leaflet similar to  TTE done 09/26/21. The mitral valve is abnormal. Mild to moderate mitral  valve regurgitation. No evidence of mitral stenosis.   5. The aortic valve is tricuspid. Aortic valve regurgitation is not  visualized. No aortic stenosis is present.   6. The inferior vena cava is normal in size with greater than 50%  respiratory variability, suggesting right atrial pressure of 3 mmHg.    CHA2DS2-VASc Score = 4  The patient's score is based upon: CHF History: 1 HTN History: 1 Diabetes History: 0 Stroke History: 0 Vascular Disease History: 0 Age Score: 1 Gender Score: 1       ASSESSMENT AND PLAN: Persistent Atrial Fibrillation (ICD10:  I48.19) The patient's CHA2DS2-VASc score is 4, indicating a 4.8% annual risk of stroke.   Patient in persistent afib since 11/17 We discussed rhythm control options today. After discussing the risks and benefits, will arrange for DCCV. Continue carvedilol 25 mg BID Continue Xarelto 20 mg daily Check bmet/cbc/TSH today  Secondary Hypercoagulable State (ICD10:  D68.69) The patient is at significant risk for stroke/thromboembolism based upon her CHA2DS2-VASc Score of 4.  Continue Rivaroxaban (Xarelto).   Chronic HFrEF Chemotoxicity S/p ICD Fluid status appears stable today  HTN Stable on current regimen  OSA  Patient has not been on CPAP for years.     Follow up in the AF clinic post DCCV.   Informed Consent   Shared Decision Making/Informed Consent The risks (stroke, cardiac arrhythmias  rarely resulting in the need for a temporary or permanent pacemaker, skin irritation or burns and complications associated with conscious sedation including aspiration, arrhythmia, respiratory failure and death), benefits (restoration of normal sinus rhythm) and alternatives of a direct current cardioversion were explained in detail to Ms. Claudia [name] and she agrees to proceed.         Jorja Loa PA-C Afib Clinic [facility] [address] [place], [place] [address] [phone]

## 2023-08-27 NOTE — Patient Instructions (Addendum)
Cardioversion scheduled for: Monday, December 2nd   - Arrive at the [facility] and go to admitting at 630am   - Do not eat or drink anything after midnight the night prior to your procedure.   - Take all your morning medication (except diabetic medications) with a sip of water prior to arrival.  - You will not be able to drive home after your procedure.    - Do NOT miss any doses of your blood thinner - if you should miss a dose please notify our office immediately.   - If you feel as if you go back into normal rhythm prior to scheduled cardioversion, please notify our office immediately.   If your procedure is canceled in the cardioversion suite you will be charged a cancellation fee.      Hold below medications 72 hours prior to scheduled procedure/anesthesia. Restart medication on the following day after scheduled procedure/anesthesia  Empagliflozin (Jardiance)    For those patients who have a scheduled procedure/anesthesia on the same day of the week as their dose, hold the medication on the day of surgery.  They can take their scheduled dose the week before.  **Patients on the above medications scheduled for elective procedures that have not held the medication for the appropriate amount of time are at risk of cancellation or change in the anesthetic plan.

## 2023-08-30 NOTE — Progress Notes (Signed)
Pt called in concerning procedure on Monday, December 2nd, information reviewed, and questions answered

## 2023-08-30 NOTE — Progress Notes (Addendum)
Pt called and reminded of procedure for Monday and arrive at 0630am, and stay NPO after MN on Sunday.  Make sure to take Xarelto the day before. Left message on ans machine.

## 2023-09-02 ENCOUNTER — Encounter (HOSPITAL_COMMUNITY)
Admission: RE | Disposition: A | Discharge status: Home / Self Care | Payer: Self-pay | Source: Ambulatory Visit | Attending: Cardiovascular Disease

## 2023-09-02 ENCOUNTER — Ambulatory Visit (HOSPITAL_COMMUNITY)
Admission: RE | Admit: 2023-09-02 | Discharge: 2023-09-02 | Disposition: A | Discharge status: Home / Self Care | Payer: PPO | Source: Ambulatory Visit | Attending: Cardiovascular Disease | Admitting: Cardiovascular Disease

## 2023-09-02 ENCOUNTER — Other Ambulatory Visit: Payer: Self-pay

## 2023-09-02 ENCOUNTER — Ambulatory Visit (HOSPITAL_COMMUNITY): Payer: PPO | Admitting: Registered Nurse

## 2023-09-02 ENCOUNTER — Ambulatory Visit (INDEPENDENT_AMBULATORY_CARE_PROVIDER_SITE_OTHER): Payer: HMO

## 2023-09-02 DIAGNOSIS — I509 Heart failure, unspecified: Secondary | ICD-10-CM | POA: Diagnosis not present

## 2023-09-02 DIAGNOSIS — Z95810 Presence of automatic (implantable) cardiac defibrillator: Secondary | ICD-10-CM | POA: Diagnosis not present

## 2023-09-02 DIAGNOSIS — D6869 Other thrombophilia: Secondary | ICD-10-CM | POA: Diagnosis not present

## 2023-09-02 DIAGNOSIS — I5022 Chronic systolic (congestive) heart failure: Secondary | ICD-10-CM | POA: Diagnosis not present

## 2023-09-02 DIAGNOSIS — I110 Hypertensive heart disease with heart failure: Secondary | ICD-10-CM | POA: Insufficient documentation

## 2023-09-02 DIAGNOSIS — E119 Type 2 diabetes mellitus without complications: Secondary | ICD-10-CM | POA: Diagnosis not present

## 2023-09-02 DIAGNOSIS — I4819 Other persistent atrial fibrillation: Secondary | ICD-10-CM

## 2023-09-02 DIAGNOSIS — Z7901 Long term (current) use of anticoagulants: Secondary | ICD-10-CM | POA: Insufficient documentation

## 2023-09-02 DIAGNOSIS — Z79899 Other long term (current) drug therapy: Secondary | ICD-10-CM | POA: Diagnosis not present

## 2023-09-02 DIAGNOSIS — I4891 Unspecified atrial fibrillation: Secondary | ICD-10-CM | POA: Diagnosis not present

## 2023-09-02 DIAGNOSIS — I428 Other cardiomyopathies: Secondary | ICD-10-CM

## 2023-09-02 DIAGNOSIS — Z853 Personal history of malignant neoplasm of breast: Secondary | ICD-10-CM | POA: Insufficient documentation

## 2023-09-02 DIAGNOSIS — I480 Paroxysmal atrial fibrillation: Secondary | ICD-10-CM | POA: Diagnosis not present

## 2023-09-02 DIAGNOSIS — Z87891 Personal history of nicotine dependence: Secondary | ICD-10-CM | POA: Diagnosis not present

## 2023-09-02 DIAGNOSIS — Z7984 Long term (current) use of oral hypoglycemic drugs: Secondary | ICD-10-CM | POA: Insufficient documentation

## 2023-09-02 DIAGNOSIS — G4733 Obstructive sleep apnea (adult) (pediatric): Secondary | ICD-10-CM | POA: Insufficient documentation

## 2023-09-02 DIAGNOSIS — E785 Hyperlipidemia, unspecified: Secondary | ICD-10-CM | POA: Insufficient documentation

## 2023-09-02 HISTORY — PX: CARDIOVERSION: EP1203

## 2023-09-02 LAB — CUP PACEART REMOTE DEVICE CHECK
Battery Remaining Percentage: 70 %
Date Time Interrogation Session: 20241202112900
HighPow Impedance: 90 Ohm
Implantable Lead Connection Status: 753985
Implantable Lead Implant Date: 20150819
Implantable Lead Location: 753862
Implantable Lead Model: 3010
Implantable Pulse Generator Implant Date: 20220225
Pulse Gen Serial Number: 153927

## 2023-09-02 SURGERY — CARDIOVERSION (CATH LAB)
Anesthesia: General

## 2023-09-02 MED ORDER — LIDOCAINE 2% (20 MG/ML) 5 ML SYRINGE
INTRAMUSCULAR | Status: DC | PRN
  Administered 2023-09-02: 60 mg via INTRAVENOUS

## 2023-09-02 MED ORDER — SODIUM CHLORIDE 0.9% FLUSH: 10.0000 mL | Freq: Two times a day (BID) | INTRAVENOUS | Status: DC

## 2023-09-02 MED ORDER — PROPOFOL 10 MG/ML IV BOLUS
INTRAVENOUS | Status: DC | PRN
  Administered 2023-09-02: 50 mg via INTRAVENOUS

## 2023-09-02 SURGICAL SUPPLY — 1 items: PAD DEFIB RADIO PHYSIO CONN (PAD) ×1 IMPLANT

## 2023-09-02 NOTE — Anesthesia Postprocedure Evaluation (Signed)
Anesthesia Post Note  Patient: Claudia [name]  Procedure(s) Performed: CARDIOVERSION     Patient location during evaluation: PACU Anesthesia Type: General Level of consciousness: awake and alert Pain management: pain level controlled Vital Signs Assessment: post-procedure vital signs reviewed and stable Respiratory status: spontaneous breathing, nonlabored ventilation, respiratory function stable and patient connected to nasal cannula oxygen Cardiovascular status: blood pressure returned to baseline and stable Postop Assessment: no apparent nausea or vomiting Anesthetic complications: no  There were no known notable events for this encounter.  Last Vitals:  Vitals:   09/02/23 0810 09/02/23 0825  BP: 94/61 113/73  Pulse: 71 71  Resp: (!) 22 (!) 24  Temp:    SpO2: 99% 97%    Last Pain:  Vitals:   09/02/23 0825  TempSrc:   PainSc: 0-No pain                 Shelton Silvas

## 2023-09-02 NOTE — Anesthesia Preprocedure Evaluation (Addendum)
Anesthesia Evaluation  Patient identified by MRN, date of birth, ID band Patient awake    Reviewed: Allergy & Precautions, NPO status , Patient's Chart, lab work & pertinent test results, reviewed documented beta blocker date and time   Airway Mallampati: II  TM Distance: >3 FB Neck ROM: Full    Dental  (+) Teeth Intact, Dental Advisory Given   Pulmonary former smoker   breath sounds clear to auscultation       Cardiovascular hypertension, Pt. on medications and Pt. on home beta blockers +CHF  + dysrhythmias Atrial Fibrillation + Cardiac Defibrillator  Rhythm:Irregular Rate:Abnormal  Echo: 1. EF reported 35-40% on TTE 12/27/222 side by side comparison slightly  worse now . Left ventricular ejection fraction, by estimation, is 30 to  35%. The left ventricle has moderately decreased function. The left  ventricle demonstrates global  hypokinesis. The left ventricular internal cavity size was moderately  dilated. Left ventricular diastolic parameters are consistent with Grade I  diastolic dysfunction (impaired relaxation). The average left ventricular  global longitudinal strain is -9.7  %. The global longitudinal strain is abnormal.   2. Right ventricular systolic function is normal. The right ventricular  size is normal.   3. Left atrial size was moderately dilated.   4. Eccentric functional MR with restricted posterior leaflet similar to  TTE done 09/26/21. The mitral valve is abnormal. Mild to moderate mitral  valve regurgitation. No evidence of mitral stenosis.   5. The aortic valve is tricuspid. Aortic valve regurgitation is not  visualized. No aortic stenosis is present.   6. The inferior vena cava is normal in size with greater than 50%  respiratory variability, suggesting right atrial pressure of 3 mmHg.     Neuro/Psych    GI/Hepatic negative GI ROS, Neg liver ROS,,,  Endo/Other  diabetes, Type 2, Oral Hypoglycemic  Agents    Renal/GU negative Renal ROS     Musculoskeletal negative musculoskeletal ROS (+)    Abdominal   Peds  Hematology negative hematology ROS (+)   Anesthesia Other Findings   Reproductive/Obstetrics                             Anesthesia Physical Anesthesia Plan  ASA: 4  Anesthesia Plan: General   Post-op Pain Management: Minimal or no pain anticipated   Induction: Intravenous  PONV Risk Score and Plan: 0  Airway Management Planned: Natural Airway and Simple Face Mask  Additional Equipment: None  Intra-op Plan:   Post-operative Plan:   Informed Consent: I have reviewed the patients History and Physical, chart, labs and discussed the procedure including the risks, benefits and alternatives for the proposed anesthesia with the patient or authorized representative who has indicated his/her understanding and acceptance.       Plan Discussed with: CRNA  Anesthesia Plan Comments:        Anesthesia Quick Evaluation

## 2023-09-02 NOTE — Transfer of Care (Signed)
Immediate Anesthesia Transfer of Care Note  Patient: Claudia [name]  Procedure(s) Performed: CARDIOVERSION  Patient Location: PACU and Cath Lab  Anesthesia Type:MAC  Level of Consciousness: awake, alert , and oriented  Airway & Oxygen Therapy: Patient connected to nasal cannula oxygen  Post-op Assessment: Report given to RN and Post -op Vital signs reviewed and stable  Post vital signs: Reviewed and stable  Last Vitals:  Vitals Value Taken Time  BP 120/94   Temp    Pulse 84 09/02/23 0744  Resp 22 09/02/23 0744  SpO2 97 % 09/02/23 0744  Vitals shown include unfiled device data.  Last Pain:  Vitals:   09/02/23 0715  TempSrc: Temporal  PainSc: 0-No pain         Complications: There were no known notable events for this encounter.

## 2023-09-02 NOTE — Interval H&P Note (Signed)
History and Physical Interval Note:  09/02/2023 7:27 AM  Claudia [name]  has presented today for surgery, with the diagnosis of afib.  The various methods of treatment have been discussed with the patient and family. After consideration of risks, benefits and other options for treatment, the patient has consented to  Procedure(s): CARDIOVERSION (N/A) as a surgical intervention.  The patient's history has been reviewed, patient examined, no change in status, stable for surgery.  I have reviewed the patient's chart and labs.  Questions were answered to the patient's satisfaction.     Crystallynn Noorani

## 2023-09-02 NOTE — Op Note (Signed)
Procedure: Electrical Cardioversion Indications:  Atrial Fibrillation  Procedure Details:  Consent: Risks of procedure as well as the alternatives and risks of each were explained to the (patient/caregiver).  Consent for procedure obtained.  Time Out: Verified patient identification, verified procedure, site/side was marked, verified correct patient position, special equipment/implants available, medications/allergies/relevent history reviewed, required imaging and test results available.  Performed  Patient placed on cardiac monitor, pulse oximetry, supplemental oxygen as necessary.  Sedation given:  IV propofol, Dr. Hart [place] Pacer pads placed anterior and posterior chest.  Cardioverted 1 time(s).  Cardioversion with synchronized biphasic 200J shock.  Evaluation: Findings: Post procedure EKG shows: NSR Complications: None Patient did tolerate procedure well.  Time Spent Directly with the Patient:  30 minutes   Claudia [name] 09/02/2023, 8:06 AM

## 2023-09-03 ENCOUNTER — Telehealth: Payer: Self-pay

## 2023-09-03 ENCOUNTER — Encounter (HOSPITAL_COMMUNITY): Payer: Self-pay | Admitting: Cardiovascular Disease

## 2023-09-03 NOTE — Telephone Encounter (Signed)
Updated transmission shows NSR w/PVC. Patient notified.

## 2023-09-03 NOTE — Telephone Encounter (Addendum)
  Device alert for ongoing AF since 08/18/23.   Known and patient had DCCV yesterday around 8:06am converting to NSR. Patient is sending me an update transmission to confirm if back in AF following DCCV yesterday.

## 2023-09-06 ENCOUNTER — Other Ambulatory Visit (HOSPITAL_COMMUNITY): Payer: Self-pay | Admitting: Cardiology

## 2023-09-06 MED ORDER — ISOSORBIDE MONONITRATE ER 30 MG PO TB24: 30.0000 mg | ORAL_TABLET | Freq: Every day | ORAL | 3 refills | Status: DC

## 2023-09-06 MED ORDER — RIVAROXABAN 20 MG PO TABS: 20.0000 mg | ORAL_TABLET | Freq: Every day | ORAL | 3 refills | Status: DC

## 2023-09-06 MED ORDER — HYDRALAZINE HCL 25 MG PO TABS: ORAL_TABLET | ORAL | 3 refills | Status: DC

## 2023-09-06 MED ORDER — EMPAGLIFLOZIN 10 MG PO TABS: 10.0000 mg | ORAL_TABLET | Freq: Every day | ORAL | 3 refills | Status: AC

## 2023-09-06 MED ORDER — CARVEDILOL 25 MG PO TABS: 25.0000 mg | ORAL_TABLET | Freq: Two times a day (BID) | ORAL | 3 refills | Status: DC

## 2023-09-06 MED ORDER — FUROSEMIDE 40 MG PO TABS: 40.0000 mg | ORAL_TABLET | ORAL | 3 refills | Status: DC | PRN

## 2023-09-06 MED ORDER — EPLERENONE 25 MG PO TABS: 12.5000 mg | ORAL_TABLET | Freq: Every day | ORAL | 3 refills | Status: DC

## 2023-09-06 MED ORDER — ENTRESTO 97-103 MG PO TABS: 1.0000 | ORAL_TABLET | Freq: Two times a day (BID) | ORAL | 3 refills | Status: AC

## 2023-09-11 DIAGNOSIS — M79622 Pain in left upper arm: Secondary | ICD-10-CM | POA: Diagnosis not present

## 2023-09-16 ENCOUNTER — Encounter (HOSPITAL_COMMUNITY): Payer: Self-pay | Admitting: Physician Assistant

## 2023-09-16 ENCOUNTER — Ambulatory Visit (HOSPITAL_COMMUNITY)
Admission: RE | Admit: 2023-09-16 | Discharge: 2023-09-16 | Disposition: A | Discharge status: Home / Self Care | Payer: PPO | Source: Ambulatory Visit | Attending: Physician Assistant | Admitting: Physician Assistant

## 2023-09-16 VITALS — BP 132/92 | HR 73 | Ht 66.0 in | Wt 203.6 lb

## 2023-09-16 DIAGNOSIS — I5022 Chronic systolic (congestive) heart failure: Secondary | ICD-10-CM | POA: Diagnosis not present

## 2023-09-16 DIAGNOSIS — R9431 Abnormal electrocardiogram [ECG] [EKG]: Secondary | ICD-10-CM | POA: Insufficient documentation

## 2023-09-16 DIAGNOSIS — E785 Hyperlipidemia, unspecified: Secondary | ICD-10-CM | POA: Diagnosis not present

## 2023-09-16 DIAGNOSIS — I110 Hypertensive heart disease with heart failure: Secondary | ICD-10-CM | POA: Diagnosis not present

## 2023-09-16 DIAGNOSIS — I4819 Other persistent atrial fibrillation: Secondary | ICD-10-CM | POA: Diagnosis not present

## 2023-09-16 DIAGNOSIS — D6869 Other thrombophilia: Secondary | ICD-10-CM

## 2023-09-16 DIAGNOSIS — Z853 Personal history of malignant neoplasm of breast: Secondary | ICD-10-CM | POA: Insufficient documentation

## 2023-09-16 DIAGNOSIS — Z7901 Long term (current) use of anticoagulants: Secondary | ICD-10-CM | POA: Insufficient documentation

## 2023-09-16 DIAGNOSIS — I4891 Unspecified atrial fibrillation: Secondary | ICD-10-CM | POA: Diagnosis present

## 2023-09-16 DIAGNOSIS — T451X5D Adverse effect of antineoplastic and immunosuppressive drugs, subsequent encounter: Secondary | ICD-10-CM | POA: Diagnosis not present

## 2023-09-16 DIAGNOSIS — G4733 Obstructive sleep apnea (adult) (pediatric): Secondary | ICD-10-CM | POA: Diagnosis not present

## 2023-09-16 DIAGNOSIS — Z95810 Presence of automatic (implantable) cardiac defibrillator: Secondary | ICD-10-CM | POA: Insufficient documentation

## 2023-09-16 NOTE — Progress Notes (Signed)
Primary Care Physician: Mirna Mires, MD Primary Cardiologist: Arvilla Meres, MD Electrophysiologist: Sherryl Manges, MD  Referring Physician: Dr Julianne Rice is a 67 y.o. female with a history of h/o breast cancer in 2008 and systolic HF due to chemotoxicity s/p ICD, HTN, HLD, OSA, atrial fibrillation who presents for follow up in the [facility].  The patient was initially diagnosed with atrial fibrillation remotely and is on Xarelto for a CHADS2VASC score of 4. The device clinic received an alert for an ongoing afib episode starting 08/18/23. Patient was unaware of her afib. She was ill with an URI at the time of her afib onset.   On follow up today, patient is s/p DCCV on 09/02/23. She remains in SR today and feels well. No bleeding issues on anticoagulation.   Today, she denies symptoms of palpitations, chest pain, shortness of breath, orthopnea, PND, lower extremity edema, dizziness, presyncope, syncope, snoring, daytime somnolence, bleeding, or neurologic sequela. The patient is tolerating medications without difficulties and is otherwise without complaint today.    Atrial Fibrillation Risk Factors:  she does have symptoms or diagnosis of sleep apnea. she does not have a history of rheumatic fever.   Atrial Fibrillation Management history:  Previous antiarrhythmic drugs: none Previous cardioversions: 09/02/23 Previous ablations: none Anticoagulation history: Xarelto   ROS- All systems are reviewed and negative except as per the HPI above.  Past Medical History:  Diagnosis Date   AICD (automatic cardioverter/defibrillator) present 09/15/2014   Breast cancer (HCC)    CHF (congestive heart failure) (HCC)    Implantable cardioverter-defibrillator infection  s/p extraction 10/07/2014    Current Outpatient Medications  Medication Sig Dispense Refill   acetaminophen (TYLENOL) 500 MG tablet Take 1,000 mg by mouth every 6 (six) hours as needed  for moderate pain (pain score 4-6).     carvedilol (COREG) 25 MG tablet Take 1 tablet (25 mg total) by mouth 2 (two) times daily with a meal. 180 tablet 3   empagliflozin (JARDIANCE) 10 MG TABS tablet Take 1 tablet (10 mg total) by mouth daily before breakfast. 90 tablet 3   eplerenone (INSPRA) 25 MG tablet Take 0.5 tablets (12.5 mg total) by mouth daily. 45 tablet 3   furosemide (LASIX) 40 MG tablet Take 1 tablet (40 mg total) by mouth as needed for edema. 30 tablet 3   hydrALAZINE (APRESOLINE) 25 MG tablet Take 1 & 1/2 tablets (37.5 mg) by mouth 3 times daily 405 tablet 3   isosorbide mononitrate (IMDUR) 30 MG 24 hr tablet Take 1 tablet (30 mg total) by mouth at bedtime. 90 tablet 3   rivaroxaban (XARELTO) 20 MG TABS tablet Take 1 tablet (20 mg total) by mouth daily with supper. 90 tablet 3   rosuvastatin (CRESTOR) 5 MG tablet Take 5 mg by mouth at bedtime.     sacubitril-valsartan (ENTRESTO) 97-103 MG Take 1 tablet by mouth 2 (two) times daily. 180 tablet 3   No current facility-administered medications for this encounter.    Physical Exam: BP (!) 132/92   Pulse 73   Ht 5\' 6"  (1.676 m)   Wt 92.4 kg   BMI 32.86 kg/m   GEN: Well nourished, well developed in no acute distress NECK: No JVD; No carotid bruits CARDIAC: Regular rate and rhythm, no murmurs, rubs, gallops RESPIRATORY:  Clear to auscultation without rales, wheezing or rhonchi  ABDOMEN: Soft, non-tender, non-distended EXTREMITIES:  No edema; No deformity    Wt Readings from  Last 3 Encounters:  09/16/23 92.4 kg  08/27/23 92.9 kg  03/19/23 93.4 kg     EKG today demonstrates  SR, LVH Vent. rate 73 BPM PR interval 196 ms QRS duration 96 ms QT/QTcB 418/460 ms   Echo 11/22/22 demonstrated   1. EF reported 35-40% on TTE 12/27/222 side by side comparison slightly  worse now . Left ventricular ejection fraction, by estimation, is 30 to  35%. The left ventricle has moderately decreased function. The left  ventricle  demonstrates global hypokinesis. The left ventricular internal cavity size was moderately dilated. Left ventricular diastolic parameters are consistent with Grade I diastolic dysfunction (impaired relaxation). The average left ventricular global longitudinal strain is -9.7  %. The global longitudinal strain is abnormal.   2. Right ventricular systolic function is normal. The right ventricular  size is normal.   3. Left atrial size was moderately dilated.   4. Eccentric functional MR with restricted posterior leaflet similar to  TTE done 09/26/21. The mitral valve is abnormal. Mild to moderate mitral  valve regurgitation. No evidence of mitral stenosis.   5. The aortic valve is tricuspid. Aortic valve regurgitation is not  visualized. No aortic stenosis is present.   6. The inferior vena cava is normal in size with greater than 50%  respiratory variability, suggesting right atrial pressure of 3 mmHg.    CHA2DS2-VASc Score = 4  The patient's score is based upon: CHF History: 1 HTN History: 1 Diabetes History: 0 Stroke History: 0 Vascular Disease History: 0 Age Score: 1 Gender Score: 1       ASSESSMENT AND PLAN: Persistent Atrial Fibrillation (ICD10:  I48.19) The patient's CHA2DS2-VASc score is 4, indicating a 4.8% annual risk of stroke.   S/p DCCV 09/02/23 Patient appears to be maintaining SR Continue carvedilol 25 mg BID Continue Xarelto 20 mg daily  Secondary Hypercoagulable State (ICD10:  D68.69) The patient is at significant risk for stroke/thromboembolism based upon her CHA2DS2-VASc Score of 4.  Continue Rivaroxaban (Xarelto).   Chronic HFrEF Chemotoxicity EF 35-40%, s/p ICD Fluid status appears stable GDMT per [facility]  HTN Stable on current regimen  OSA  Patient has not been on CPAP for years    Follow up with Dr Gala Romney as scheduled and with Dr Graciela Husbands per recall.     Jorja Loa PA-C Afib Clinic [facility] [address] [place], [place]  [address] [phone]

## 2023-09-17 DIAGNOSIS — I480 Paroxysmal atrial fibrillation: Secondary | ICD-10-CM | POA: Diagnosis not present

## 2023-09-17 DIAGNOSIS — M25532 Pain in left wrist: Secondary | ICD-10-CM | POA: Diagnosis not present

## 2023-09-17 DIAGNOSIS — M79622 Pain in left upper arm: Secondary | ICD-10-CM | POA: Diagnosis not present

## 2023-10-31 ENCOUNTER — Encounter (HOSPITAL_COMMUNITY): Payer: Self-pay | Admitting: Internal Medicine

## 2023-10-31 ENCOUNTER — Ambulatory Visit (HOSPITAL_COMMUNITY)
Admission: RE | Admit: 2023-10-31 | Discharge: 2023-10-31 | Disposition: A | Discharge status: Home / Self Care | Payer: PPO | Source: Ambulatory Visit | Attending: Internal Medicine | Admitting: Internal Medicine

## 2023-10-31 VITALS — BP 134/80 | HR 72 | Wt 201.6 lb

## 2023-10-31 DIAGNOSIS — Z95810 Presence of automatic (implantable) cardiac defibrillator: Secondary | ICD-10-CM | POA: Diagnosis not present

## 2023-10-31 DIAGNOSIS — I10 Essential (primary) hypertension: Secondary | ICD-10-CM | POA: Diagnosis not present

## 2023-10-31 DIAGNOSIS — I480 Paroxysmal atrial fibrillation: Secondary | ICD-10-CM | POA: Diagnosis not present

## 2023-10-31 DIAGNOSIS — I5022 Chronic systolic (congestive) heart failure: Secondary | ICD-10-CM | POA: Insufficient documentation

## 2023-10-31 NOTE — Patient Instructions (Addendum)
There has been no changes to your medications.  Your physician has requested that you have an echocardiogram. Echocardiography is a painless test that uses sound waves to create images of your heart. It provides your doctor with information about the size and shape of your heart and how well your heart's chambers and valves are working. This procedure takes approximately one hour. There are no restrictions for this procedure. Please do NOT wear cologne, perfume, aftershave, or lotions (deodorant is allowed). Please arrive 15 minutes prior to your appointment time.  Please note: We ask at that you not bring children with you during ultrasound (echo/ vascular) testing. Due to room size and safety concerns, children are not allowed in the ultrasound rooms during exams. Our front office staff cannot provide observation of children in our lobby area while testing is being conducted. An adult accompanying a patient to their appointment will only be allowed in the ultrasound room at the discretion of the ultrasound technician under special circumstances. We apologize for any inconvenience.  Your physician recommends that you schedule a follow-up appointment in: 1 year ( January 2026) ** PLEASE CALL THE OFFICE IN NOVEMBER TO ARRANGE YOUR FOLLOW UP APPOINTMENT.**  If you have any questions or concerns before your next appointment please send [place] a message through [place] or call our office at [phone].    TO LEAVE A MESSAGE FOR THE NURSE SELECT OPTION 2, PLEASE LEAVE A MESSAGE INCLUDING: YOUR NAME DATE OF BIRTH CALL BACK NUMBER REASON FOR CALL**this is important as we prioritize the call backs  YOU WILL RECEIVE A CALL BACK THE SAME DAY AS LONG AS YOU CALL BEFORE 4:00 PM  At the [facility], you and your health needs are our priority. As part of our continuing mission to provide you with exceptional heart care, we have created designated Provider Care Teams. These Care Teams include  your primary Cardiologist (physician) and Advanced Practice Providers (APPs- Physician Assistants and Nurse Practitioners) who all work together to provide you with the care you need, when you need it.   You may see any of the following providers on your designated Care Team at your next follow up: Dr Arvilla Meres Dr Marca Ancona Dr. Dorthula Nettles Dr. Clearnce Hasten Amy Filbert Schilder, NP Robbie Lis, [place] [facility] [place], [place] Brynda Peon, NP [place] Lee, NP Karle Plumber, PharmD   Please be sure to bring in all your medications bottles to every appointment.    Thank you for choosing [facility]

## 2023-10-31 NOTE — Progress Notes (Addendum)
Patient ID: Claudia [name], female   DOB: Mar 15, 1956, 68 y.o.   MRN: [id number]  Primary Cardiologist: Dr Claudia [name] PCP: Claudia Mires, MD Oncology: Dr Claudia [name].  EP: Dr Claudia [name]  HPI: Claudia [name] is a 68 y/o woman with h/o breast cancer in 2008 and systolic HF due to chemotoxicity. She also h/o PAF and previous DVT for which she is on chronic anticoagulation. She moved from [place] in 07/2014 and saw Dr. Eden [name].   She developed breast cancer in 2008 and underwent chemotherapy and in the wake of this, she developed congestive heart failure with EF 20%. Cardiac cath with normal coronaries. She received an ICD. It became infected in the spring of 2015 with staph and it was extracted. She has undergone S ICD implantation following prolonged use of a LifeVest. She was ultimately transferred to [facility] in [place] in 01/2014 for consideration of heart transplantation. They diuresed her over 60 pounds and she improved markedly.   March 2017 after  ICD fired x3. Device was reprogrammed and beta blocker increased. ECHO repeated with EF 45%.   Echo 8/20 EF 35-40% ECHO 11/2015: EF 45% Grade I DD ECHO 10/07/14 and EF 40-45% with normal RV.   Echo 11/21 EF 35-40%  Echo 12/22 EF 35-40%  Here for yearly f/u. Had AF in 12/24 and had DC-CVSays she feels good. No CP or SOB. Does an hour of pool exercises. No edema, orthopnea or PND. No low BPs.   Echo 2/24 EF 30-35% mild-mod MR Images reviewed personally    ROS: All systems negative except as listed in HPI, PMH and Problem List.  SH:  Social History   Socioeconomic History   Marital status: Divorced    Spouse name: Not on file   Number of children: Not on file   Years of education: Not on file   Highest education level: Not on file  Occupational History   Not on file  Tobacco Use   Smoking status: Former   Smokeless tobacco: Never   Tobacco comments:    Former smoker 08/26/26  Vaping Use   Vaping status: Never Used  Substance and Sexual Activity   Alcohol use:  No   Drug use: No   Sexual activity: Not on file  Other Topics Concern   Not on file  Social History Narrative   Not on file   Social Drivers of Health   Financial Resource Strain: Not on file  Food Insecurity: No Food Insecurity (05/30/2020)   Hunger Vital Sign    Worried About Running Out of Food in the Last Year: Never true    Ran Out of Food in the Last Year: Never true  Transportation Needs: No Transportation Needs (05/30/2020)   PRAPARE - Administrator, Civil Service (Medical): No    Lack of Transportation (Non-Medical): No  Physical Activity: Not on file  Stress: Not on file  Social Connections: Not on file  Intimate Partner Violence: Not on file    FH:  Family History  Problem Relation Age of Onset   Diabetes Mother    Cancer Father     Past Medical History:  Diagnosis Date   AICD (automatic cardioverter/defibrillator) present 09/15/2014   Breast cancer (HCC)    CHF (congestive heart failure) (HCC)    Implantable cardioverter-defibrillator infection  s/p extraction 10/07/2014    Current Outpatient Medications  Medication Sig Dispense Refill   acetaminophen (TYLENOL) 500 MG tablet Take 1,000 mg by mouth every 6 (six) hours as needed for  moderate pain (pain score 4-6).     carvedilol (COREG) 25 MG tablet Take 1 tablet (25 mg total) by mouth 2 (two) times daily with a meal. 180 tablet 3   empagliflozin (JARDIANCE) 10 MG TABS tablet Take 1 tablet (10 mg total) by mouth daily before breakfast. 90 tablet 3   eplerenone (INSPRA) 25 MG tablet Take 0.5 tablets (12.5 mg total) by mouth daily. 45 tablet 3   furosemide (LASIX) 40 MG tablet Take 1 tablet (40 mg total) by mouth as needed for edema. 30 tablet 3   hydrALAZINE (APRESOLINE) 25 MG tablet Take 1 & 1/2 tablets (37.5 mg) by mouth 3 times daily 405 tablet 3   isosorbide mononitrate (IMDUR) 30 MG 24 hr tablet Take 1 tablet (30 mg total) by mouth at bedtime. 90 tablet 3   rivaroxaban (XARELTO) 20 MG TABS  tablet Take 1 tablet (20 mg total) by mouth daily with supper. 90 tablet 3   rosuvastatin (CRESTOR) 5 MG tablet Take 5 mg by mouth at bedtime.     sacubitril-valsartan (ENTRESTO) 97-103 MG Take 1 tablet by mouth 2 (two) times daily. 180 tablet 3   No current facility-administered medications for this encounter.    Vitals:   10/31/23 1338  BP: 134/80  Pulse: 72  SpO2: 98%  Weight: 91.4 kg (201 lb 9.6 oz)   Wt Readings from Last 3 Encounters:  10/31/23 91.4 kg (201 lb 9.6 oz)  09/16/23 92.4 kg (203 lb 9.6 oz)  08/27/23 92.9 kg (204 lb 12.8 oz)     PHYSICAL EXAM: General:  Well appearing. No resp difficulty HEENT: normal Neck: supple. no JVD. Carotids 2+ bilat; no bruits. No lymphadenopathy or thryomegaly appreciated. Cor: PMI nondisplaced. Regular rate & rhythm. No rubs, gallops or murmurs. Lungs: clear Abdomen: soft, nontender, nondistended. No hepatosplenomegaly. No bruits or masses. Good bowel sounds. Extremities: no cyanosis, clubbing, rash, edema Neuro: alert & orientedx3, cranial nerves grossly intact. moves all 4 extremities w/o difficulty. Affect pleasant   ECG NSR 72 LVH with repol 1 PVC Personally reviewed  ASSESSMENT & PLAN:  1. Chronic systolic HF   - likely due to chemotoxicity from breast CA. EF in January 2016 40-45% .  - Cath normal in [place].  - s/p [employer] Sci SQ ICD No fitings - Echo 8/20 EF 35-40%. - Echo 11/21 EF 35-40% RV moderately reduced - Echo 12/22 EF 35-40% - Echo 2/25 EF 30-35% mild-mod MR -NYHA I volume looks goos - Continue carvedilol 25 mg bid. - Continue Imdur 30 mg  - Continue hydralazine 37.5 mg TID - Continue Entresto to 97/103 BID - Continue Inspra 25 mg daily - Continue Jardiance 10 mg - Doing very well on optimal GDMT  - EF slightly down on last years echo. Will repeat to assess for change - Recent bloodwork with PCP ok. Reviewed personally.   2. Breast CA  - s/p treatment 2008. No evidence of recurrence - per patient, no follow  up needed  3. Paroxysmal AF  - Had AF in 12/24 and had DC-CV - Remains in NSR  On Xarelto no bleeding   4. H/o DVT - on Xarelto .No obvious bleeding  5. Hyperlipidemia  - on crestor. Followed by Claudia [name] - no change  6. H/O VT ICD discharge x3 11/2015. -No recent ICD firings (SQ device)  7. HTN - Blood pressure well controlled. Continue current regimen.   Arvilla Meres, MD 1:48 PM

## 2023-11-04 NOTE — Addendum Note (Signed)
Encounter addended by: Dolores Patty, MD on: 11/04/2023 11:45 AM  Actions taken: Clinical Note Signed

## 2023-11-19 DIAGNOSIS — H903 Sensorineural hearing loss, bilateral: Secondary | ICD-10-CM | POA: Diagnosis not present

## 2023-12-02 ENCOUNTER — Ambulatory Visit (INDEPENDENT_AMBULATORY_CARE_PROVIDER_SITE_OTHER): Payer: HMO

## 2023-12-02 DIAGNOSIS — I5022 Chronic systolic (congestive) heart failure: Secondary | ICD-10-CM | POA: Diagnosis not present

## 2023-12-02 DIAGNOSIS — I428 Other cardiomyopathies: Secondary | ICD-10-CM

## 2023-12-05 LAB — CUP PACEART REMOTE DEVICE CHECK
Battery Remaining Percentage: 67 %
Date Time Interrogation Session: 20250306084600
HighPow Impedance: 95 Ohm
Implantable Lead Connection Status: 753985
Implantable Lead Implant Date: 20150819
Implantable Lead Location: 753862
Implantable Lead Model: 3010
Implantable Pulse Generator Implant Date: 20220225
Pulse Gen Serial Number: 153927

## 2023-12-09 ENCOUNTER — Ambulatory Visit (HOSPITAL_COMMUNITY)
Admission: RE | Admit: 2023-12-09 | Discharge: 2023-12-09 | Disposition: A | Discharge status: Home / Self Care | Payer: PPO | Source: Ambulatory Visit | Attending: Internal Medicine | Admitting: Internal Medicine

## 2023-12-09 DIAGNOSIS — I340 Nonrheumatic mitral (valve) insufficiency: Secondary | ICD-10-CM | POA: Insufficient documentation

## 2023-12-09 DIAGNOSIS — I5022 Chronic systolic (congestive) heart failure: Secondary | ICD-10-CM | POA: Diagnosis not present

## 2023-12-09 DIAGNOSIS — I4891 Unspecified atrial fibrillation: Secondary | ICD-10-CM | POA: Insufficient documentation

## 2023-12-09 LAB — ECHOCARDIOGRAM COMPLETE
AR max vel: 1.9 cm2
AV Area VTI: 1.69 cm2
AV Area mean vel: 1.67 cm2
AV Mean grad: 3 mmHg
AV Peak grad: 3.7 mmHg
Ao pk vel: 0.97 m/s
Area-P 1/2: 3.34 cm2
MV M vel: 6.03 m/s
MV Peak grad: 145.4 mmHg
MV VTI: 1.64 cm2
Radius: 0.4 cm
S' Lateral: 5.6 cm
Single Plane A4C EF: 29.2 %

## 2023-12-17 DIAGNOSIS — I480 Paroxysmal atrial fibrillation: Secondary | ICD-10-CM | POA: Diagnosis not present

## 2023-12-17 DIAGNOSIS — E1169 Type 2 diabetes mellitus with other specified complication: Secondary | ICD-10-CM | POA: Diagnosis not present

## 2023-12-17 DIAGNOSIS — I10 Essential (primary) hypertension: Secondary | ICD-10-CM | POA: Diagnosis not present

## 2023-12-17 DIAGNOSIS — I110 Hypertensive heart disease with heart failure: Secondary | ICD-10-CM | POA: Diagnosis not present

## 2023-12-17 DIAGNOSIS — C50912 Malignant neoplasm of unspecified site of left female breast: Secondary | ICD-10-CM | POA: Diagnosis not present

## 2023-12-17 DIAGNOSIS — I5042 Chronic combined systolic (congestive) and diastolic (congestive) heart failure: Secondary | ICD-10-CM | POA: Diagnosis not present

## 2023-12-17 DIAGNOSIS — Z95810 Presence of automatic (implantable) cardiac defibrillator: Secondary | ICD-10-CM | POA: Diagnosis not present

## 2023-12-17 DIAGNOSIS — E7800 Pure hypercholesterolemia, unspecified: Secondary | ICD-10-CM | POA: Diagnosis not present

## 2023-12-26 DIAGNOSIS — H903 Sensorineural hearing loss, bilateral: Secondary | ICD-10-CM | POA: Diagnosis not present

## 2024-01-02 NOTE — Addendum Note (Signed)
 Addended by: Geralyn Flash D on: 01/02/2024 10:58 AM   Modules accepted: Orders

## 2024-01-02 NOTE — Progress Notes (Signed)
 Remote ICD transmission.

## 2024-01-10 ENCOUNTER — Encounter (HOSPITAL_COMMUNITY): Payer: Self-pay | Admitting: Internal Medicine

## 2024-01-10 DIAGNOSIS — I5022 Chronic systolic (congestive) heart failure: Secondary | ICD-10-CM

## 2024-01-11 ENCOUNTER — Encounter: Payer: Self-pay | Admitting: Internal Medicine

## 2024-01-14 DIAGNOSIS — Z1231 Encounter for screening mammogram for malignant neoplasm of breast: Secondary | ICD-10-CM | POA: Diagnosis not present

## 2024-01-17 NOTE — Telephone Encounter (Signed)
 Order for cardiac MRI placed Message to schedulers to arrange followup x 6 weeks (well after CMri)

## 2024-03-02 ENCOUNTER — Ambulatory Visit (INDEPENDENT_AMBULATORY_CARE_PROVIDER_SITE_OTHER): Payer: HMO

## 2024-03-02 ENCOUNTER — Ambulatory Visit: Payer: Self-pay | Admitting: Cardiology

## 2024-03-02 DIAGNOSIS — I5022 Chronic systolic (congestive) heart failure: Secondary | ICD-10-CM

## 2024-03-02 DIAGNOSIS — I428 Other cardiomyopathies: Secondary | ICD-10-CM

## 2024-03-02 LAB — CUP PACEART REMOTE DEVICE CHECK
Battery Remaining Percentage: 64 %
Date Time Interrogation Session: 20250602081600
HighPow Impedance: 105 Ohm
Implantable Lead Connection Status: 753985
Implantable Lead Implant Date: 20150819
Implantable Lead Location: 753862
Implantable Lead Model: 3010
Implantable Pulse Generator Implant Date: 20220225
Pulse Gen Serial Number: 153927

## 2024-03-05 ENCOUNTER — Other Ambulatory Visit (HOSPITAL_COMMUNITY)

## 2024-03-13 ENCOUNTER — Encounter: Admitting: Internal Medicine

## 2024-03-17 DIAGNOSIS — S29011A Strain of muscle and tendon of front wall of thorax, initial encounter: Secondary | ICD-10-CM | POA: Diagnosis not present

## 2024-03-17 DIAGNOSIS — N6312 Unspecified lump in the right breast, upper inner quadrant: Secondary | ICD-10-CM | POA: Diagnosis not present

## 2024-03-23 ENCOUNTER — Encounter: Payer: Self-pay | Admitting: Cardiology

## 2024-03-23 ENCOUNTER — Ambulatory Visit: Attending: Cardiology | Admitting: Cardiology

## 2024-03-23 VITALS — BP 110/70 | HR 66 | Ht 66.0 in | Wt 204.0 lb

## 2024-03-23 DIAGNOSIS — I472 Ventricular tachycardia, unspecified: Secondary | ICD-10-CM | POA: Diagnosis not present

## 2024-03-23 DIAGNOSIS — D6869 Other thrombophilia: Secondary | ICD-10-CM | POA: Diagnosis not present

## 2024-03-23 DIAGNOSIS — I4819 Other persistent atrial fibrillation: Secondary | ICD-10-CM | POA: Diagnosis not present

## 2024-03-23 DIAGNOSIS — I10 Essential (primary) hypertension: Secondary | ICD-10-CM

## 2024-03-23 DIAGNOSIS — I5022 Chronic systolic (congestive) heart failure: Secondary | ICD-10-CM | POA: Diagnosis not present

## 2024-03-23 NOTE — Progress Notes (Signed)
  Electrophysiology Office Note:   Date:  03/23/2024  ID:  Kersten, Salmons 07-24-1956, MRN [id number]  Primary Cardiologist: Toribio Fuel, MD Primary Heart Failure: None Electrophysiologist: Elspeth Sage, MD      History of Present Illness:   Claudia [name] is a 68 y.o. female with h/o chronic systolic heart failure, breast cancer postchemotherapy 2008, atrial fibrillation, hyperlipidemia, hypertension seen today for routine electrophysiology followup.   Since last being seen in our clinic the patient reports doing well.  She has no acute complaints at this time.  She has no chest pain or shortness of breath.  Is able to do all of her daily activities without restriction.  She has an upcoming cardiac MRI as her ejection fraction has worsened..  she denies chest pain, palpitations, dyspnea, PND, orthopnea, nausea, vomiting, dizziness, syncope, edema, weight gain, or early satiety.   Review of systems complete and found to be negative unless listed in HPI.      EP Information / Studies Reviewed:    EKG is not ordered today. EKG from 10/31/23 reviewed which showed sinus rhythm, PVC      ICD Interrogation-  reviewed in detail today,  See PACEART report.  Device History: Architect ICD implanted for CHF History of appropriate therapy: Yes History of AAD therapy: No   Risk Assessment/Calculations:    CHA2DS2-VASc Score = 4   This indicates a 4.8% annual risk of stroke. The patient's score is based upon: CHF History: 1 HTN History: 1 Diabetes History: 0 Stroke History: 0 Vascular Disease History: 0 Age Score: 1 Gender Score: 1            Physical Exam:   VS:  BP 110/70 (BP Location: Right Arm, Patient Position: Sitting, Cuff Size: Large)   Pulse 66   Ht 5' 6 (1.676 m)   Wt 204 lb (92.5 kg)   SpO2 98%   BMI 32.93 kg/m    Wt Readings from Last 3 Encounters:  03/23/24 204 lb (92.5 kg)  10/31/23 201 lb 9.6 oz (91.4 kg)  09/16/23 203 lb 9.6 oz (92.4 kg)      GEN: Well nourished, well developed in no acute distress NECK: No JVD; No carotid bruits CARDIAC: Regular rate and rhythm, no murmurs, rubs, gallops RESPIRATORY:  Clear to auscultation without rales, wheezing or rhonchi  ABDOMEN: Soft, non-tender, non-distended EXTREMITIES:  No edema; No deformity   ASSESSMENT AND PLAN:    Chronic systolic dysfunction s/p Environmental manager S-ICD  euvolemic today Stable on an appropriate medical regimen Normal ICD function Sensing, threshold, impedance within normal limits Programming reviewed and appropriate See Pace Art report No changes today  2.  Persistent atrial fibrillation: Post cardioversion 12-24.  Remains in normal rhythm.  Alexie Samson continue with current management.  3.  Secondary hypercoagulable state: On Xarelto   4.  Ventricular tachycardia: No recent ICD firings.  Had ICD's discharge March 2017 x 3.  5.  Hypertension: Well-controlled   Disposition:   Follow up with EP APP in 12 months   Signed, Taison Celani Gladis Norton, MD

## 2024-03-24 LAB — CUP PACEART INCLINIC DEVICE CHECK
Date Time Interrogation Session: 20250624074540
Implantable Lead Connection Status: 753985
Implantable Lead Implant Date: 20150819
Implantable Lead Location: 753862
Implantable Lead Model: 3010
Implantable Pulse Generator Implant Date: 20220225
Pulse Gen Serial Number: 153927

## 2024-03-25 ENCOUNTER — Ambulatory Visit: Payer: Self-pay | Admitting: Cardiology

## 2024-03-25 NOTE — CV Procedure (Signed)
  Device system confirmed to be MRI conditional, with implant date > 6 weeks ago, and no evidence of abandoned or epicardial leads in review of most recent CXR  Device last cleared by EP Provider: Prentice Passey 03/25/24 Clearance is good through for 1 year as long as parameters remain stable at time of check. If pt undergoes a cardiac device procedure during that time, they should be re-cleared.   Tachy-therapies to be programmed off if applicable with device back to pre-MRI settings after completion of exam.  [employer] was available remotely to assist in programming recommendations.   Claudia [name], RT  03/25/2024 11:23 AM

## 2024-03-26 ENCOUNTER — Encounter (HOSPITAL_COMMUNITY): Payer: Self-pay

## 2024-03-30 ENCOUNTER — Other Ambulatory Visit (HOSPITAL_COMMUNITY): Payer: Self-pay | Admitting: Internal Medicine

## 2024-03-30 ENCOUNTER — Ambulatory Visit (HOSPITAL_COMMUNITY)
Admission: RE | Admit: 2024-03-30 | Discharge: 2024-03-30 | Disposition: A | Discharge status: Home / Self Care | Source: Ambulatory Visit | Attending: Internal Medicine | Admitting: Internal Medicine

## 2024-03-30 DIAGNOSIS — I5022 Chronic systolic (congestive) heart failure: Secondary | ICD-10-CM

## 2024-03-30 MED ORDER — GADOBUTROL 1 MMOL/ML IV SOLN
10.0000 mL | Freq: Once | INTRAVENOUS | Status: AC | PRN
  Administered 2024-03-30: 10 mL via INTRAVENOUS

## 2024-03-31 DIAGNOSIS — N6312 Unspecified lump in the right breast, upper inner quadrant: Secondary | ICD-10-CM | POA: Diagnosis not present

## 2024-03-31 DIAGNOSIS — S29011D Strain of muscle and tendon of front wall of thorax, subsequent encounter: Secondary | ICD-10-CM | POA: Diagnosis not present

## 2024-04-01 ENCOUNTER — Ambulatory Visit (HOSPITAL_COMMUNITY): Payer: Self-pay | Admitting: Internal Medicine

## 2024-04-01 NOTE — Telephone Encounter (Signed)
 Pt aware, agreeable, and verbalized understanding   Follow up scheduled

## 2024-04-20 NOTE — Progress Notes (Signed)
 Remote ICD transmission.

## 2024-06-02 ENCOUNTER — Encounter: Payer: Self-pay | Admitting: Cardiology

## 2024-06-03 ENCOUNTER — Ambulatory Visit (INDEPENDENT_AMBULATORY_CARE_PROVIDER_SITE_OTHER): Payer: HMO

## 2024-06-03 DIAGNOSIS — I5022 Chronic systolic (congestive) heart failure: Secondary | ICD-10-CM

## 2024-06-04 ENCOUNTER — Ambulatory Visit: Payer: Self-pay | Admitting: Cardiology

## 2024-06-04 LAB — CUP PACEART REMOTE DEVICE CHECK
Battery Remaining Percentage: 62 %
Date Time Interrogation Session: 20250902143200
HighPow Impedance: 140 Ohm
Implantable Lead Connection Status: 753985
Implantable Lead Implant Date: 20150819
Implantable Lead Location: 753862
Implantable Lead Model: 3010
Implantable Pulse Generator Implant Date: 20220225
Pulse Gen Serial Number: 153927

## 2024-06-08 DIAGNOSIS — H25813 Combined forms of age-related cataract, bilateral: Secondary | ICD-10-CM | POA: Diagnosis not present

## 2024-06-09 ENCOUNTER — Encounter: Payer: Self-pay | Admitting: Cardiology

## 2024-06-09 ENCOUNTER — Telehealth: Payer: Self-pay

## 2024-06-09 NOTE — Telephone Encounter (Signed)
   Pre-operative Risk Assessment    Patient Name: Claudia [name]  DOB: September 21, 1956 MRN: [id number]   Date of last office visit: 03/23/24 WILL CAMNITZ, MD Date of next office visit: NONE   Request for Surgical Clearance    Procedure:  CATARACT EXTRACTION BY PE, IOL- RIGHT  Date of Surgery:  Clearance 06/18/24       AND 07/27/24                         Surgeon:  SR ANDREW J. MINCEY Surgeon's Group or Practice Name:  [facility] Phone number:  [phone]   EXT 5125 Fax number:  [phone]   Type of Clearance Requested:   - Medical  - Pharmacy:  Hold Rivaroxaban  (Xarelto ) PER REQUEST : PT DOES NOT NEED TO HOLD ANTICOAGULANTS FOR SURGERIES   Type of Anesthesia:  IV SEDATION   Additional requests/questions:  PT ALSO NEEDS DEVICE CLEARANCE  Signed, Lucie DELENA Ku   06/09/2024, 10:07 AM

## 2024-06-09 NOTE — Telephone Encounter (Signed)
   Patient Name: Claudia [name]  DOB: Jul 08, 1956 MRN: [id number]  Primary Cardiologist: Toribio Fuel, MD  Chart reviewed as part of pre-operative protocol coverage. Cataract extractions are recognized in guidelines as low risk surgeries that do not typically require specific preoperative testing or holding of blood thinner therapy. Therefore, given past medical history and time since last visit, based on ACC/AHA guidelines, Claudia [name] would be at acceptable risk for the planned procedure without further cardiovascular testing.   Device clearance request sent to heart care device clinic as well.  I will route this recommendation to the requesting party via Epic fax function and remove from pre-op pool.  Please call with questions.  Claudia CHRISTELLA Beauvais, NP 06/09/2024, 11:24 AM

## 2024-06-09 NOTE — Progress Notes (Signed)
 PERIOPERATIVE PRESCRIPTION FOR IMPLANTED CARDIAC DEVICE PROGRAMMING   Patient Information:  Patient: Claudia [name]  MRN: [id number]  Date of Birth: 04/20/1956   Procedure:  CATARACT EXTRACTION BY PE, IOL- RIGHT   Date of Surgery:  Clearance 06/18/24  AND 07/27/24                           Surgeon:  SR ANDREW J. MINCEY Surgeon's Group or Practice Name:  [facility] Phone number:  [phone]   EXT 5125 Fax number:  [phone]   Device Information:   Clinic EP Physician:   Dr. Soyla Norton Device Type:  Defibrillator Manufacturer and Phone #:  [employer]: [phone] Pacemaker Dependent?:  No Date of Last Device Check:  06/02/2024        Normal Device Function?:  Yes     Electrophysiologist's Recommendations:   Have magnet available. Provide continuous ECG monitoring when magnet is used or reprogramming is to be performed.  Procedure may interfere with device function.  Magnet should be placed over device during procedure.  Per Device Clinic Standing Orders, Rozelle JONELLE Banter  06/09/2024 12:14 PM

## 2024-06-13 NOTE — Progress Notes (Signed)
Remote ICD Transmission.

## 2024-06-17 ENCOUNTER — Ambulatory Visit (HOSPITAL_COMMUNITY)
Admission: RE | Admit: 2024-06-17 | Discharge: 2024-06-17 | Disposition: A | Discharge status: Home / Self Care | Source: Ambulatory Visit | Attending: Internal Medicine | Admitting: Internal Medicine

## 2024-06-17 ENCOUNTER — Encounter (HOSPITAL_COMMUNITY): Payer: Self-pay | Admitting: Internal Medicine

## 2024-06-17 VITALS — BP 104/64 | HR 70 | Wt 208.2 lb

## 2024-06-17 DIAGNOSIS — I429 Cardiomyopathy, unspecified: Secondary | ICD-10-CM | POA: Diagnosis not present

## 2024-06-17 DIAGNOSIS — I5022 Chronic systolic (congestive) heart failure: Secondary | ICD-10-CM | POA: Insufficient documentation

## 2024-06-17 DIAGNOSIS — I480 Paroxysmal atrial fibrillation: Secondary | ICD-10-CM | POA: Insufficient documentation

## 2024-06-17 DIAGNOSIS — I472 Ventricular tachycardia, unspecified: Secondary | ICD-10-CM

## 2024-06-17 DIAGNOSIS — Z853 Personal history of malignant neoplasm of breast: Secondary | ICD-10-CM | POA: Diagnosis not present

## 2024-06-17 DIAGNOSIS — Z79899 Other long term (current) drug therapy: Secondary | ICD-10-CM | POA: Insufficient documentation

## 2024-06-17 DIAGNOSIS — Z87891 Personal history of nicotine dependence: Secondary | ICD-10-CM | POA: Insufficient documentation

## 2024-06-17 DIAGNOSIS — E785 Hyperlipidemia, unspecified: Secondary | ICD-10-CM | POA: Diagnosis not present

## 2024-06-17 DIAGNOSIS — Z86718 Personal history of other venous thrombosis and embolism: Secondary | ICD-10-CM | POA: Diagnosis not present

## 2024-06-17 DIAGNOSIS — Z7984 Long term (current) use of oral hypoglycemic drugs: Secondary | ICD-10-CM | POA: Insufficient documentation

## 2024-06-17 DIAGNOSIS — Z7901 Long term (current) use of anticoagulants: Secondary | ICD-10-CM | POA: Insufficient documentation

## 2024-06-17 DIAGNOSIS — I110 Hypertensive heart disease with heart failure: Secondary | ICD-10-CM | POA: Insufficient documentation

## 2024-06-17 NOTE — Addendum Note (Signed)
 Encounter addended by: Buell Powell HERO, RN on: 06/17/2024 2:46 PM  Actions taken: Order list changed, Diagnosis association updated, Clinical Note Signed

## 2024-06-17 NOTE — Progress Notes (Signed)
 Patient ID: Claudia [name], female   DOB: 1955/10/13, 68 y.o.   MRN: [id number]  Primary Cardiologist: Dr Cherrie PCP: Leigh Lung, MD Oncology: Dr Odean.  EP: Dr Kelin  HPI: [name] is a 68 y/o woman with h/o breast cancer in 2008 and systolic HF due to chemotoxicity. She also h/o PAF and previous DVT for which she is on chronic anticoagulation. She moved from [place]  in 07/2014 and saw Dr. Delford.   She developed breast cancer in 2008 and underwent chemotherapy and in the wake of this, she developed congestive heart failure with EF 20%. Cardiac cath with normal coronaries. She received an ICD. It became infected in the spring of 2015 with staph and it was extracted. She has undergone S ICD implantation following prolonged use of a LifeVest. She was ultimately transferred to [facility] in [place] in 01/2014 for consideration of heart transplantation. They diuresed her over 60 pounds and she improved markedly.   March 2017 after  ICD fired x3. Device was reprogrammed and beta blocker increased. ECHO repeated with EF 45%.   Echo 8/20 EF 35-40% ECHO 11/2015: EF 45% Grade I DD ECHO 10/07/14 and EF 40-45% with normal RV.   Echo 11/21 EF 35-40%  Echo 12/22 EF 35-40%  Echo 2/24 EF 30-35% mild-mod MR  Echo 3/25 EF 25-30% RV mild down   cMRI 6/25 LVEF 29% RVEF 29%  Delayed enhancement images were difficult with artifact, and the basal to mid inferior wall could not be interpreted. There did appear to be mid-wall LGE in the basal to mid inferoseptal wall including the RV insertion site. This is not a coronary-type pattern. Cardiac sarcoidosis is a possibility, though can also see this pattern in nonischemic dilated cardiomyopathies of uncertain etiology (nonspecific). Would consider cardiac PET.  Here for f/u. Pool exercises and go to [employer] (bike x 30 mins + weight) about 4x/week. No SOB, edema, orthopnea or PND. Compliant with meds. Pending cataract surgery tomorrow    ROS: All systems  negative except as listed in HPI, PMH and Problem List.  SH:  Social History   Socioeconomic History   Marital status: Divorced    Spouse name: Not on file   Number of children: Not on file   Years of education: Not on file   Highest education level: Not on file  Occupational History   Not on file  Tobacco Use   Smoking status: Former   Smokeless tobacco: Never   Tobacco comments:    Former smoker 08/26/26  Vaping Use   Vaping status: Never Used  Substance and Sexual Activity   Alcohol use: No   Drug use: No   Sexual activity: Not on file  Other Topics Concern   Not on file  Social History Narrative   Not on file   Social Drivers of Health   Financial Resource Strain: Not on file  Food Insecurity: No Food Insecurity (05/30/2020)   Hunger Vital Sign    Worried About Running Out of Food in the Last Year: Never true    Ran Out of Food in the Last Year: Never true  Transportation Needs: No Transportation Needs (05/30/2020)   PRAPARE - Administrator, Civil Service (Medical): No    Lack of Transportation (Non-Medical): No  Physical Activity: Not on file  Stress: Not on file  Social Connections: Not on file  Intimate Partner Violence: Not on file    FH:  Family History  Problem Relation Age of Onset  Diabetes Mother    Cancer Father     Past Medical History:  Diagnosis Date   AICD (automatic cardioverter/defibrillator) present 09/15/2014   Breast cancer (HCC)    CHF (congestive heart failure) (HCC)    Implantable cardioverter-defibrillator infection  s/p extraction 10/07/2014    Current Outpatient Medications  Medication Sig Dispense Refill   acetaminophen  (TYLENOL ) 500 MG tablet Take 1,000 mg by mouth every 6 (six) hours as needed for moderate pain (pain score 4-6).     carvedilol  (COREG ) 25 MG tablet Take 1 tablet (25 mg total) by mouth 2 (two) times daily with a meal. 180 tablet 3   cyclobenzaprine  (FLEXERIL ) 5 MG tablet Take 5 mg by mouth at  bedtime.     empagliflozin  (JARDIANCE ) 10 MG TABS tablet Take 1 tablet (10 mg total) by mouth daily before breakfast. 90 tablet 3   eplerenone  (INSPRA ) 25 MG tablet Take 0.5 tablets (12.5 mg total) by mouth daily. 45 tablet 3   furosemide  (LASIX ) 40 MG tablet Take 1 tablet (40 mg total) by mouth as needed for edema. 30 tablet 3   hydrALAZINE  (APRESOLINE ) 25 MG tablet Take 1 & 1/2 tablets (37.5 mg) by mouth 3 times daily 405 tablet 3   isosorbide  mononitrate (IMDUR ) 30 MG 24 hr tablet Take 1 tablet (30 mg total) by mouth at bedtime. 90 tablet 3   rivaroxaban  (XARELTO ) 20 MG TABS tablet Take 1 tablet (20 mg total) by mouth daily with supper. 90 tablet 3   rosuvastatin (CRESTOR) 5 MG tablet Take 5 mg by mouth at bedtime.     sacubitril-valsartan (ENTRESTO ) 97-103 MG Take 1 tablet by mouth 2 (two) times daily. 180 tablet 3   No current facility-administered medications for this encounter.    Vitals:   06/17/24 1404  BP: 104/64  Pulse: 70  SpO2: 95%  Weight: 94.4 kg (208 lb 3.2 oz)   Wt Readings from Last 3 Encounters:  06/17/24 94.4 kg (208 lb 3.2 oz)  03/23/24 92.5 kg (204 lb)  10/31/23 91.4 kg (201 lb 9.6 oz)     PHYSICAL EXAM: General:  Well appearing. No resp difficulty HEENT: normal Neck: supple. no JVD. Carotids 2+ bilat; no bruits. No lymphadenopathy or thryomegaly appreciated. Cor: PMI nondisplaced. Regular rate & rhythm. No rubs, gallops or murmurs. Lungs: clear Abdomen: soft, nontender, nondistended. No hepatosplenomegaly. No bruits or masses. Good bowel sounds. Extremities: no cyanosis, clubbing, rash, edema Neuro: alert & orientedx3, cranial nerves grossly intact. moves all 4 extremities w/o difficulty. Affect pleasant   ECG n/a  ASSESSMENT & PLAN:  1. Chronic systolic HF   - likely due to chemotoxicity from breast CA. EF in January 2016 40-45% .  - Cath normal in [place].  - s/p [employer] Sci SQ ICD No firings  - Echo 8/20 EF 35-40%. - Echo 11/21 EF 35-40% RV  moderately reduced - Echo 12/22 EF 35-40% - Echo 2/24 EF 30-35% mild-mod MR - Echo 3/25 EF 25-30% RV mild HK moderate MR - Doing well NYHA I. Bue EF dropping slowly. cMRI raises question of sarcoid. Will get PET - Continue carvedilol  25 mg bid. - Continue Imdur  30 mg  - Continue hydralazine  37.5 mg TID - Continue Entresto  to 97/103 BID - Continue Inspra  25 mg daily - Continue Jardiance  10 mg - Continue excellent GDMT  2. Breast CA  - s/p treatment 2008. No evidence of recurrence - per patient, no follow up needed  3. Paroxysmal AF  - Had AF in 12/24 and  had DC-CV -  Remains in NSR.SABRA No bleeding on Xarelto    4. H/o DVT - Continue Xarelto   5. Hyperlipidemia  - on crestor. Followed by Dr. Leigh - no change  6. H/O VT ICD discharge x3 11/2015. - No recent VT  7. HTN - Blood pressure well controlled. Continue current regimen.    Toribio Fuel, MD 2:26 PM

## 2024-06-17 NOTE — Patient Instructions (Addendum)
 Medication Changes:  None, continue current medications  Testing/Procedures:  Cardiac PET Scan has been ordered, please see instructions below as there is a special diet you will need to follow the day before the test, you will be called to schedule this  Special Instructions // Education:  Do the following things EVERYDAY: Weigh yourself in the morning before breakfast. Write it down and keep it in a log. Take your medicines as prescribed Eat low salt foods--Limit salt (sodium) to 2000 mg per day.  Stay as active as you can everyday Limit all fluids for the day to less than 2 liters   Cardiac Sarcoidosis/Inflammation PET Scan Patient Instructions  Please report to Radiology at the [facility] Main Entrance 15 minutes early for your test.  [address] [place], [place] [address]  BRING FOOD DIARY WITH YOU TO THIS APPOINTMENT For 24 hours before the test: Do not exercise! Do not eat after 5 pm the day before your test! To make sure that your scanning results are accurate, you MUST follow the sarcoid prep meal diet starting the day before your PET scan. This diet involves eating no carbohydrates 24 hours before the test.  You will keep a log of all that you eat the day before your test. If you have questions or do not understand this diet, please call [phone] for more information. If you are unable to follow this diet, please discuss an alternative strategy with the coordinator.  If you are diabetic, continue your diabetes medications as usual on the day before until you begin to fast. NO DIABETES MEDICATIONS ONCE YOU BEGIN TO FAST. On the morning of your test, please wait until after your test to take your morning medications.  What foods can I eat the day before my test?  Drink only water or black coffee (WITHOUT sugar, artificial sweetener, cream, or milk). Eggs (prepared without milk or cheese)  Meat that is either broiled or pan fried in butter WITHOUT  breading (chicken, [place], bacon, meat-only sausage, hamburger, steak, fish) Butter, salt & pepper What foods must I AVOID the day before my test?  Do not consume alcoholic beverages, sodas, fruit juice, coffee creamer, or sports drinks  Do not eat vegetables, beans, nuts, fruits, juices, bread, grains, rice, pasta, potatoes, or any baked goods Do not eat dairy products (milk, cheese, etc.)  Do not eat mayonnaise, ketchup, tartar sauce, mustard, or other condiments Do not add sugar, artificial sweeteners, or Splenda (sucralose) to foods or drinks  Do not eat breaded foods (like fried chicken)  Do not eat sweets, candy, gum, sweetened cough drops, lozenges, or sugar  Do not eat sweetened, grilled, or cured meats or meat with carbohydrate-containing additives (some sausages, ham, sweetened bacon)  Suggested items for breakfast, lunch, or dinner:  Breakfast  3 to 5 fatty sausage links fried in butter. 3 to 5 bacon strips.  3 eggs pan fried in butter (no milk or cheese).  Lunch/Dinner  2 hamburger patties fried in butter. Chicken or fatty fish pan fried in butter. No breading. 8 oz. fatty steak pan fried in butter.  Beverages  Drink only water or black coffee. DO NOT ADD SUGAR, ARTIFICIAL SWEETENER, CREAM, OR MILK  Please call [facility] to cancel or reschedule your appointment at [phone] For more information and frequently asked questions, please visit our website : [email or web address]   Cardiac Sarcoidosis/Inflammation PET Scan  Food Diary Name: _____________________________ Please fill in EXACTLY what you have eaten and  when for 24 hours PRIOR to your test date.  Time Food/Drink Comments  Breakfast                Lunch                Dinner                Snacks                 DO NOT EXERCISE THE DAY BEFORE YOUR TEST DO NOT EAT AFTER 5 PM THE DAY BEFORE YOUR TEST.  ON THE DAY OF YOUR TEST, DO NOT EAT ANY FOOD AND ONLY DRINK CLEAR  WATER! PLEASE BRING THIS FOOD DIARY WITH YOU TO YOUR APPOINTMENT   Follow-Up in: 6 months (March 2026), **PLEASE CALL OUR OFFICE IN JANUARY TO SCHEDULE THESE TEST   At the [facility], you and your health needs are our priority. We have a designated team specialized in the treatment of Heart Failure. This Care Team includes your primary Heart Failure Specialized Cardiologist (physician), Advanced Practice Providers (APPs- Physician Assistants and Nurse Practitioners), and Pharmacist who all work together to provide you with the care you need, when you need it.   You may see any of the following providers on your designated Care Team at your next follow up:  Dr. Toribio Fuel Dr. Ezra Shuck Dr. Ria Commander Dr. Odis Brownie Greig Mosses, NP Caffie Shed, [place] [facility], [place] Beckey Coe, NP [place] Lee, NP Tinnie Redman, PharmD   Please be sure to bring in all your medications bottles to every appointment.   Need to Contact Us :  If you have any questions or concerns before your next appointment please send us  a message through [place] or call our office at [phone].    TO LEAVE A MESSAGE FOR THE NURSE SELECT OPTION 2, PLEASE LEAVE A MESSAGE INCLUDING: YOUR NAME DATE OF BIRTH CALL BACK NUMBER REASON FOR CALL**this is important as we prioritize the call backs  YOU WILL RECEIVE A CALL BACK THE SAME DAY AS LONG AS YOU CALL BEFORE 4:00 PM

## 2024-06-18 ENCOUNTER — Encounter (HOSPITAL_COMMUNITY): Admitting: Internal Medicine

## 2024-06-18 DIAGNOSIS — I110 Hypertensive heart disease with heart failure: Secondary | ICD-10-CM | POA: Diagnosis not present

## 2024-06-18 DIAGNOSIS — H25811 Combined forms of age-related cataract, right eye: Secondary | ICD-10-CM | POA: Diagnosis not present

## 2024-06-18 DIAGNOSIS — I509 Heart failure, unspecified: Secondary | ICD-10-CM | POA: Diagnosis not present

## 2024-06-18 DIAGNOSIS — I2510 Atherosclerotic heart disease of native coronary artery without angina pectoris: Secondary | ICD-10-CM | POA: Diagnosis not present

## 2024-06-30 ENCOUNTER — Encounter (HOSPITAL_COMMUNITY): Payer: Self-pay | Admitting: Emergency Medicine

## 2024-06-30 ENCOUNTER — Encounter (HOSPITAL_COMMUNITY): Payer: Self-pay

## 2024-07-02 DIAGNOSIS — H25812 Combined forms of age-related cataract, left eye: Secondary | ICD-10-CM | POA: Diagnosis not present

## 2024-07-02 DIAGNOSIS — I2510 Atherosclerotic heart disease of native coronary artery without angina pectoris: Secondary | ICD-10-CM | POA: Diagnosis not present

## 2024-07-28 ENCOUNTER — Telehealth (HOSPITAL_COMMUNITY): Payer: Self-pay | Admitting: *Deleted

## 2024-07-28 NOTE — Telephone Encounter (Signed)
Reaching out to patient to offer assistance regarding upcoming cardiac imaging study; pt verbalizes understanding of appt date/time, parking situation and where to check in, pre-test NPO status, and verified current allergies; name and call back number provided for further questions should they arise  Larey Brick RN Navigator Cardiac Imaging [facility] [phone] office [phone] cell  Patient verbalized understanding of diet prep.

## 2024-07-30 ENCOUNTER — Encounter (HOSPITAL_COMMUNITY)
Admission: RE | Admit: 2024-07-30 | Discharge: 2024-07-30 | Disposition: A | Discharge status: Home / Self Care | Source: Ambulatory Visit | Attending: Internal Medicine | Admitting: Internal Medicine

## 2024-07-30 DIAGNOSIS — I5022 Chronic systolic (congestive) heart failure: Secondary | ICD-10-CM | POA: Insufficient documentation

## 2024-07-30 DIAGNOSIS — Z9012 Acquired absence of left breast and nipple: Secondary | ICD-10-CM | POA: Diagnosis not present

## 2024-07-30 LAB — NM PET CT MYOCARDIAL SARCOIDOSIS
Nuc Stress EF: 27 %
Rest Nuclear Isotope Dose: 24.4 mCi

## 2024-07-30 MED ORDER — RUBIDIUM RB82 GENERATOR (RUBYFILL)
24.4200 | PACK | Freq: Once | INTRAVENOUS | Status: AC
  Administered 2024-07-30: 24.42 via INTRAVENOUS

## 2024-07-30 MED ORDER — FLUDEOXYGLUCOSE F - 18 (FDG) INJECTION
8.9700 | Freq: Once | INTRAVENOUS | Status: AC
  Administered 2024-07-30: 8.97 via INTRAVENOUS

## 2024-08-03 DIAGNOSIS — I110 Hypertensive heart disease with heart failure: Secondary | ICD-10-CM | POA: Diagnosis not present

## 2024-08-03 DIAGNOSIS — I5042 Chronic combined systolic (congestive) and diastolic (congestive) heart failure: Secondary | ICD-10-CM | POA: Diagnosis not present

## 2024-08-03 DIAGNOSIS — I480 Paroxysmal atrial fibrillation: Secondary | ICD-10-CM | POA: Diagnosis not present

## 2024-08-03 DIAGNOSIS — C50912 Malignant neoplasm of unspecified site of left female breast: Secondary | ICD-10-CM | POA: Diagnosis not present

## 2024-08-03 DIAGNOSIS — Z95811 Presence of heart assist device: Secondary | ICD-10-CM | POA: Diagnosis not present

## 2024-08-03 DIAGNOSIS — E7800 Pure hypercholesterolemia, unspecified: Secondary | ICD-10-CM | POA: Diagnosis not present

## 2024-08-03 DIAGNOSIS — E1169 Type 2 diabetes mellitus with other specified complication: Secondary | ICD-10-CM | POA: Diagnosis not present

## 2024-08-03 DIAGNOSIS — Z95810 Presence of automatic (implantable) cardiac defibrillator: Secondary | ICD-10-CM | POA: Diagnosis not present

## 2024-08-03 DIAGNOSIS — I10 Essential (primary) hypertension: Secondary | ICD-10-CM | POA: Diagnosis not present

## 2024-08-31 ENCOUNTER — Other Ambulatory Visit (HOSPITAL_COMMUNITY): Payer: Self-pay | Admitting: Cardiology

## 2024-08-31 MED ORDER — ISOSORBIDE MONONITRATE ER 30 MG PO TB24: 30.0000 mg | ORAL_TABLET | Freq: Every day | ORAL | 3 refills | Status: AC

## 2024-09-02 ENCOUNTER — Ambulatory Visit: Payer: HMO

## 2024-09-02 DIAGNOSIS — I5022 Chronic systolic (congestive) heart failure: Secondary | ICD-10-CM

## 2024-09-03 ENCOUNTER — Ambulatory Visit: Payer: Self-pay | Admitting: Cardiology

## 2024-09-03 LAB — CUP PACEART REMOTE DEVICE CHECK
Battery Remaining Percentage: 59 %
Date Time Interrogation Session: 20251203081400
HighPow Impedance: 135 Ohm
Implantable Lead Connection Status: 753985
Implantable Lead Implant Date: 20150819
Implantable Lead Location: 753862
Implantable Lead Model: 3010
Implantable Pulse Generator Implant Date: 20220225
Pulse Gen Serial Number: 153927

## 2024-09-08 NOTE — Progress Notes (Signed)
 Remote ICD Transmission

## 2024-09-25 ENCOUNTER — Telehealth: Payer: Self-pay

## 2024-09-25 NOTE — Telephone Encounter (Signed)
 Alert remote transmission: SUB-Q ICD:  Measured AF of at least 12.0 hours in a 24 hour period Hx of PAF.  Nearly persistent AF since 12/10 per trending.  On OAC per EMR.  AF alert suspended x 1 mo to monitor for plan of care  DCCV on 09/02/23. Back in AF appears predominantly since 09/09/24.   Spoke with patient.  She has had respiratory infection over past 2 weeks and has been on antibiotics with cough and congestion.  She is improving.  Will recheck her AF burden in 2-3 weeks to ensure improvement and continue to monitor.  Patient denies feeling any palpitations or SOB.  She is fatigued but she is still recovering from acute illness.     FYI to Dr. Inocencio.

## 2024-09-25 NOTE — Telephone Encounter (Signed)
 Claudia Soyla Lunger, MD to Claudia [name] Triage   09/25/24  3:14 PM Should see AF clinic for rhythm control ____________________________________________________________________________  Spoke with patient to relay the above recommendation. Advised her to avoid missing any doses of Xarelto . Pt verbalizes understanding and agreement with plan. Forwarding to AF Clinic team for scheduling. Pt is aware she may not receive call to schedule until next week.

## 2024-10-05 ENCOUNTER — Encounter: Payer: Self-pay | Admitting: *Deleted

## 2024-10-05 NOTE — Progress Notes (Signed)
 Claudia [name]                                          MRN: [id number]   10/05/2024   The [facility] Quality Team Specialist reviewed this patient medical record for the purposes of chart review for care gap closure. The following were reviewed: chart review for care gap closure-glycemic status assessment.    VBCI Quality Team

## 2024-10-08 ENCOUNTER — Other Ambulatory Visit (HOSPITAL_COMMUNITY): Payer: Self-pay

## 2024-10-08 MED ORDER — RIVAROXABAN 20 MG PO TABS: 20.0000 mg | ORAL_TABLET | Freq: Every day | ORAL | 3 refills | Status: AC

## 2024-10-08 MED ORDER — EPLERENONE 25 MG PO TABS: 12.5000 mg | ORAL_TABLET | Freq: Every day | ORAL | 3 refills | Status: AC

## 2024-10-09 ENCOUNTER — Ambulatory Visit: Attending: Family Medicine

## 2024-10-09 NOTE — Telephone Encounter (Signed)
 Spoke w/ patient and assisted in sending manual transmission. 2 week transmission received and reviewed to reassess AF burden. Hx AF on Xarelto  per EPIC. Patient states medication compliance and has not missed any doses. States she previously had a cold and is now getting over it and feels much better. No symptoms noted at this time and currently feels fine. Estimated measured AF 24%.   Informed patient to contact device clinic for any questions or if symptoms arise. AF clinic visit scheduled for 10/13/2024 @ 10am. Patient appreciative for call.   Will forward to provider for awareness. Will continue to monitor and update accordingly.

## 2024-10-12 NOTE — Progress Notes (Signed)
 "  Primary Care Physician: Leigh Lung, MD Primary Cardiologist: Toribio Fuel, MD Electrophysiologist: Will Gladis Norton, MD  Referring Physician: Soyla Gladis Norton, MD   Claudia [name] is a 69 y.o. female with a history of persistent AF (on Xarelto ) HFpEF/chemo induced CM s/p SQ-[employer] ICD, VT, HTN, HLD, breast CA 2008 s/p chemotherapy, DVT who presents for follow up in the [facility].  The patient was initially diagnosed with persistent atrial fibrillation in while living in [place]  and was seen initially in the A-fib clinic on 08/27/2023 after device alert showed ongoing episode of AF.  She underwent DCCV with conversion to sinus rhythm and remained in sinus rhythm at follow-up.  She was seen by Dr. Norton on 03/23/2024 for device follow-up and was maintaining sinus rhythm.  2D echo was completed on 12/09/2023 showing worsened ejection fraction of 25-30% with global hypokinesis and cardiac MRI was completed  that confirmed EF and rule out amyloidosis.  The device clinic was alerted on 09/09/2024 showing patient back in A-fib secondary to respiratory infection and antibiotics.  Patient denied feeling any palpitations or shortness of breath but endorsed being fatigued due to acute illness.  Per Dr. Norton patient was advised to follow-up in the A-fib clinic to discuss rhythm control strategies.  [name] presents today for follow-up and to establish care in the AF clinic.  On examination today patient is EKG revealed atrial fibrillation with controlled rate. She is asymptomatic and reports that she has been feeling better after experiencing an acute illness over the Christmas holiday.  She denied any indiscretions with alcohol or caffeine and has been compliant with her anticoagulant.  We discussed strategies for rhythm control consisting of Tikosyn, amiodarone , and referral for ablation.  She is in favor of initiating amiodarone  and will undergo a DCCV at 4  weeks if not chemically converted.  Today, she denies symptoms of palpitations, chest pain, shortness of breath, orthopnea, PND, lower extremity edema, dizziness, presyncope, syncope, snoring, daytime somnolence, bleeding, or neurologic sequela. The patient is tolerating medications without difficulties and is otherwise without complaint today.   Atrial Fibrillation Management history: History of Sleep Apnea yes non compliant Previous antiarrhythmic drugs: None Previous cardioversions: 09/2023 Previous ablations: None Anticoagulation history: Xarelto   ROS- All systems are reviewed and negative except as per the HPI above.  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     amiodarone  (PACERONE ) 200 MG tablet Take 1 tablet (200 mg total) by mouth 2 (two) times daily for 30 days, THEN 1 tablet (200 mg total) daily. 60 tablet 2   carvedilol  (COREG ) 25 MG tablet Take 1 tablet (25 mg total) by mouth 2 (two) times daily with a meal. 180 tablet 3   cyclobenzaprine (FLEXERIL) 5 MG tablet Take 5 mg by mouth at bedtime.     empagliflozin  (JARDIANCE ) 10 MG TABS tablet Take 1 tablet (10 mg total) by mouth daily before breakfast. 90 tablet 3   eplerenone  (INSPRA ) 25 MG tablet Take 0.5 tablets (12.5 mg total) by mouth daily. 45 tablet 3   furosemide  (LASIX ) 40 MG tablet Take 1 tablet (40  mg total) by mouth as needed for edema. 30 tablet 3   hydrALAZINE  (APRESOLINE ) 25 MG tablet Take 1 & 1/2 tablets (37.5 mg) by mouth 3 times daily 405 tablet 3   isosorbide  mononitrate (IMDUR ) 30 MG 24 hr tablet Take 1 tablet (30 mg  total) by mouth at bedtime. 90 tablet 3   rivaroxaban  (XARELTO ) 20 MG TABS tablet Take 1 tablet (20 mg total) by mouth daily with supper. 90 tablet 3   rosuvastatin (CRESTOR) 5 MG tablet Take 5 mg by mouth at bedtime.     sacubitril-valsartan (ENTRESTO ) 97-103 MG Take 1 tablet by mouth 2 (two) times daily. 180 tablet 3   No current facility-administered medications for this encounter.    Physical Exam: BP (!) 118/92   Pulse 92   Ht 5' 6 (1.676 m)   Wt 96 kg   BMI 34.15 kg/m   GEN: Well nourished, well developed in no acute distress NECK: No JVD; No carotid bruits CARDIAC: Irregularly irregular rate and rhythm, no murmurs, rubs, gallops RESPIRATORY:  Clear to auscultation without rales, wheezing or rhonchi  ABDOMEN: Soft, non-tender, non-distended EXTREMITIES:  No edema; No deformity   Wt Readings from Last 3 Encounters:  10/13/24 96 kg  06/17/24 94.4 kg  03/23/24 92.5 kg     EKG today demonstrates:   EKG Interpretation Date/Time:  Tuesday October 13 2024 09:33:00 EST Ventricular Rate:  92 PR Interval:    QRS Duration:  100 QT Interval:  382 QTC Calculation: 472 R Axis:   125  Text Interpretation: Atrial fibrillation Septal infarct (cited on or before 25-Nov-2020) Lateral infarct (cited on or before 25-Nov-2020) Abnormal ECG When compared with ECG of 31-Oct-2023 13:30, Atrial fibrillation has replaced Sinus rhythm Non-specific change in ST segment in Lateral leads Confirmed by Wyn Manus [phone]) on 10/13/2024 9:34:44 AM        Echo Completed 12/09/2023:  1. Left ventricular ejection fraction, by estimation, is 25 to 30%. The  left ventricle has severely decreased function. The left ventricle  demonstrates global hypokinesis. The left ventricular internal cavity size  was mildly dilated. Left ventricular  diastolic parameters are consistent with Grade I diastolic dysfunction  (impaired relaxation). The average left ventricular global longitudinal  strain is -7.1 %.  The global longitudinal strain is abnormal.   2. Right ventricular systolic function is mildly reduced. The right  ventricular size is normal. There is normal pulmonary artery systolic  pressure. The estimated right ventricular systolic pressure is 32.4 mmHg.   3. Left atrial size was moderately dilated.   4. The mitral valve is abnormal. Moderate eccentric mitral valve  regurgitation, suspect functional with restricted posterior leaflet. No  evidence of mitral stenosis.   5. The aortic valve is tricuspid. Aortic valve regurgitation is not  visualized. No aortic stenosis is present.   6. The inferior vena cava is normal in size with greater than 50%  respiratory variability, suggesting right atrial pressure of 3 mmHg.   CHA2DS2-VASc Score = 5  The patient's score is based upon: CHF History: 1 HTN History: 1 Diabetes History: 0 Stroke History: 0 Vascular Disease History: 1 Age Score: 1 Gender Score: 1       ASSESSMENT AND PLAN: Persistent Atrial Fibrillation (ICD10:  I48.0) The patient's CHA2DS2-VASc score is 5, indicating a 7.2% annual risk of stroke.    Secondary Hypercoagulable State (ICD10:  D68.69) The patient is at significant risk for stroke/thromboembolism based upon her CHA2DS2-VASc Score of 5.  Continue Rivaroxaban  (Xarelto ).  -  Recent device check showing episodes of A-fib and currently in asymptomatic atrial fibrillation by EKG with controlled rate of 92 bpm - Discussion made concerning rhythm control options and patient has elected to initiate amiodarone  and DCCV if not converted in 4 weeks chemically. - Start amiodarone  200 mg  twice daily x 4 weeks then 200 mg once daily - CMET and TSH today - Scheduled for DCCV with plan to pursue if not chemically converted at 3-week EKG follow-up visit Continue Xarelto  20 mg daily- -continue carvedilol  25 mg twice daily  HFrEF/NICM: - Patient euvolemic on exam -Continue GDMT as prescribed  HTN: BP well controlled. Continue  current antihypertensive regimen.   History of DVT: - Continue Xarelto  20 mg daily  Secondary hypercoagulable state: - Continue Xarelto  as prescribed  Signed,  Wyn Raddle, Jackee Shove, NP    10/13/2024 10:24 AM     Informed Consent   Shared Decision Making/Informed Consent The risks (stroke, cardiac arrhythmias rarely resulting in the need for a temporary or permanent pacemaker, skin irritation or burns and complications associated with conscious sedation including aspiration, arrhythmia, respiratory failure and death), benefits (restoration of normal sinus rhythm) and alternatives of a direct current cardioversion were explained in detail to Ms. Andree and she agrees to proceed.      Follow up with the AF Clinic in 3 weeks for EKG check  "

## 2024-10-13 ENCOUNTER — Encounter (HOSPITAL_COMMUNITY): Payer: Self-pay | Admitting: Nurse Practitioner

## 2024-10-13 ENCOUNTER — Ambulatory Visit (HOSPITAL_COMMUNITY)
Admission: RE | Admit: 2024-10-13 | Discharge: 2024-10-13 | Disposition: A | Discharge status: Home / Self Care | Source: Ambulatory Visit | Attending: Nurse Practitioner | Admitting: Nurse Practitioner

## 2024-10-13 VITALS — BP 118/92 | HR 92 | Ht 66.0 in | Wt 211.6 lb

## 2024-10-13 DIAGNOSIS — I480 Paroxysmal atrial fibrillation: Secondary | ICD-10-CM

## 2024-10-13 DIAGNOSIS — I824Y9 Acute embolism and thrombosis of unspecified deep veins of unspecified proximal lower extremity: Secondary | ICD-10-CM | POA: Diagnosis not present

## 2024-10-13 DIAGNOSIS — I4819 Other persistent atrial fibrillation: Secondary | ICD-10-CM

## 2024-10-13 DIAGNOSIS — I5032 Chronic diastolic (congestive) heart failure: Secondary | ICD-10-CM | POA: Diagnosis not present

## 2024-10-13 DIAGNOSIS — D6859 Other primary thrombophilia: Secondary | ICD-10-CM

## 2024-10-13 DIAGNOSIS — I472 Ventricular tachycardia, unspecified: Secondary | ICD-10-CM

## 2024-10-13 DIAGNOSIS — D6869 Other thrombophilia: Secondary | ICD-10-CM

## 2024-10-13 MED ORDER — AMIODARONE HCL 200 MG PO TABS: ORAL_TABLET | ORAL | 2 refills | Status: AC

## 2024-10-13 NOTE — Patient Instructions (Addendum)
 Start Amiodarone  200mg  twice a day through 2/12 then reduce to 200mg  once a day - take with food     Hold below medications 72 hours prior to scheduled procedure/anesthesia. Restart medication on the following day after scheduled procedure/anesthesia  Empagliflozin  (Jardiance ) -- hold as of 2/6 until after procedure    Cardioversion scheduled for: Tuesday February 10th   - Arrive at the [facility] ([address]  and check in with ADMITTING at 6: 30 AM   - Do not eat or drink anything after midnight the night prior to your procedure.   - Take all your morning medication (except diabetic medications) with a sip of water prior to arrival.  - Do NOT miss any doses of your blood thinner - if you should miss a dose or take a dose more than 4 hours late -- please notify our office immediately.  - You will not be able to drive home after your procedure. Please ensure you have a responsible adult to drive you home. You will need someone with you for 24 hours post procedure.     - Expect to be in the procedural area approximately 2 hours.   - If you feel as if you go back into normal rhythm prior to scheduled cardioversion, please notify our office immediately.   If your procedure is canceled in the cardioversion suite you will be charged a cancellation fee.

## 2024-10-14 ENCOUNTER — Ambulatory Visit (HOSPITAL_COMMUNITY): Payer: Self-pay | Admitting: Nurse Practitioner

## 2024-10-14 LAB — CBC
Hematocrit: 46.8 % — ABNORMAL HIGH (ref 34.0–46.6)
Hemoglobin: 14.7 g/dL (ref 11.1–15.9)
MCH: 27 pg (ref 26.6–33.0)
MCHC: 31.4 g/dL — ABNORMAL LOW (ref 31.5–35.7)
MCV: 86 fL (ref 79–97)
Platelets: 287 x10E3/uL (ref 150–450)
RBC: 5.45 x10E6/uL — ABNORMAL HIGH (ref 3.77–5.28)
RDW: 12.8 % (ref 11.7–15.4)
WBC: 3.3 x10E3/uL — ABNORMAL LOW (ref 3.4–10.8)

## 2024-10-14 LAB — COMPREHENSIVE METABOLIC PANEL WITH GFR
ALT: 15 IU/L (ref 0–32)
AST: 15 IU/L (ref 0–40)
Albumin: 4.5 g/dL (ref 3.9–4.9)
Alkaline Phosphatase: 61 IU/L (ref 49–135)
BUN/Creatinine Ratio: 11 — ABNORMAL LOW (ref 12–28)
BUN: 11 mg/dL (ref 8–27)
Bilirubin Total: 0.5 mg/dL (ref 0.0–1.2)
CO2: 21 mmol/L (ref 20–29)
Calcium: 9.2 mg/dL (ref 8.7–10.3)
Chloride: 104 mmol/L (ref 96–106)
Creatinine, Ser: 1.03 mg/dL — ABNORMAL HIGH (ref 0.57–1.00)
Globulin, Total: 2.4 g/dL (ref 1.5–4.5)
Glucose: 125 mg/dL — ABNORMAL HIGH (ref 70–99)
Potassium: 3.9 mmol/L (ref 3.5–5.2)
Sodium: 140 mmol/L (ref 134–144)
Total Protein: 6.9 g/dL (ref 6.0–8.5)
eGFR: 59 mL/min/1.73 — ABNORMAL LOW

## 2024-10-14 LAB — TSH: TSH: 2.44 u[IU]/mL (ref 0.450–4.500)

## 2024-10-21 ENCOUNTER — Other Ambulatory Visit (HOSPITAL_COMMUNITY): Payer: Self-pay | Admitting: *Deleted

## 2024-10-22 ENCOUNTER — Other Ambulatory Visit (HOSPITAL_COMMUNITY): Payer: Self-pay

## 2024-10-22 DIAGNOSIS — I5022 Chronic systolic (congestive) heart failure: Secondary | ICD-10-CM

## 2024-10-22 MED ORDER — HYDRALAZINE HCL 25 MG PO TABS: ORAL_TABLET | ORAL | 3 refills | Status: AC

## 2024-10-23 ENCOUNTER — Other Ambulatory Visit (HOSPITAL_COMMUNITY): Payer: Self-pay

## 2024-10-23 MED ORDER — CARVEDILOL 25 MG PO TABS: 25.0000 mg | ORAL_TABLET | Freq: Two times a day (BID) | ORAL | 3 refills | Status: AC

## 2024-11-02 ENCOUNTER — Ambulatory Visit (HOSPITAL_COMMUNITY): Admitting: Nurse Practitioner

## 2024-11-11 ENCOUNTER — Ambulatory Visit (HOSPITAL_COMMUNITY): Admitting: Nurse Practitioner

## 2024-11-12 ENCOUNTER — Ambulatory Visit (HOSPITAL_COMMUNITY): Admit: 2024-11-12 | Admitting: Cardiology

## 2024-11-12 ENCOUNTER — Encounter (HOSPITAL_COMMUNITY): Payer: Self-pay

## 2024-11-12 SURGERY — CARDIOVERSION (CATH LAB)
Anesthesia: Monitor Anesthesia Care

## 2024-12-02 ENCOUNTER — Ambulatory Visit: Payer: HMO

## 2025-01-06 ENCOUNTER — Ambulatory Visit (HOSPITAL_COMMUNITY)

## 2025-03-03 ENCOUNTER — Ambulatory Visit

## 2025-06-02 ENCOUNTER — Ambulatory Visit

## 2025-09-01 ENCOUNTER — Ambulatory Visit

## 2025-12-01 ENCOUNTER — Ambulatory Visit

## 2026-03-02 ENCOUNTER — Ambulatory Visit
